# Patient Record
Sex: Male | Born: 1950 | Race: Black or African American | Hispanic: No | Marital: Married | Smoking: Former smoker
Health system: Southern US, Community
[De-identification: ages and names within clinical notes are randomized; demographics above are authoritative.]

## PROBLEM LIST (undated history)

## (undated) DIAGNOSIS — IMO0002 Reserved for concepts with insufficient information to code with codable children: Secondary | ICD-10-CM

## (undated) DIAGNOSIS — L02429 Furuncle of limb, unspecified: Secondary | ICD-10-CM

## (undated) DIAGNOSIS — G4730 Sleep apnea, unspecified: Secondary | ICD-10-CM

## (undated) DIAGNOSIS — M25519 Pain in unspecified shoulder: Secondary | ICD-10-CM

## (undated) DIAGNOSIS — C61 Malignant neoplasm of prostate: Secondary | ICD-10-CM

## (undated) DIAGNOSIS — T8859XA Other complications of anesthesia, initial encounter: Secondary | ICD-10-CM

## (undated) DIAGNOSIS — R7309 Other abnormal glucose: Secondary | ICD-10-CM

## (undated) DIAGNOSIS — J329 Chronic sinusitis, unspecified: Secondary | ICD-10-CM

## (undated) DIAGNOSIS — D649 Anemia, unspecified: Secondary | ICD-10-CM

## (undated) DIAGNOSIS — E785 Hyperlipidemia, unspecified: Secondary | ICD-10-CM

## (undated) DIAGNOSIS — J018 Other acute sinusitis: Secondary | ICD-10-CM

## (undated) DIAGNOSIS — R0600 Dyspnea, unspecified: Secondary | ICD-10-CM

## (undated) DIAGNOSIS — R0609 Other forms of dyspnea: Secondary | ICD-10-CM

## (undated) DIAGNOSIS — J849 Interstitial pulmonary disease, unspecified: Secondary | ICD-10-CM

## (undated) DIAGNOSIS — G4733 Obstructive sleep apnea (adult) (pediatric): Secondary | ICD-10-CM

## (undated) DIAGNOSIS — E039 Hypothyroidism, unspecified: Secondary | ICD-10-CM

## (undated) DIAGNOSIS — J45909 Unspecified asthma, uncomplicated: Secondary | ICD-10-CM

## (undated) DIAGNOSIS — M1990 Unspecified osteoarthritis, unspecified site: Secondary | ICD-10-CM

## (undated) DIAGNOSIS — Z8639 Personal history of other endocrine, nutritional and metabolic disease: Secondary | ICD-10-CM

## (undated) DIAGNOSIS — K219 Gastro-esophageal reflux disease without esophagitis: Secondary | ICD-10-CM

## (undated) DIAGNOSIS — G5600 Carpal tunnel syndrome, unspecified upper limb: Secondary | ICD-10-CM

## (undated) DIAGNOSIS — R599 Enlarged lymph nodes, unspecified: Secondary | ICD-10-CM

## (undated) DIAGNOSIS — Z8719 Personal history of other diseases of the digestive system: Secondary | ICD-10-CM

## (undated) DIAGNOSIS — I77810 Thoracic aortic ectasia: Secondary | ICD-10-CM

## (undated) DIAGNOSIS — R918 Other nonspecific abnormal finding of lung field: Secondary | ICD-10-CM

## (undated) DIAGNOSIS — B370 Candidal stomatitis: Secondary | ICD-10-CM

## (undated) DIAGNOSIS — J189 Pneumonia, unspecified organism: Secondary | ICD-10-CM

## (undated) DIAGNOSIS — R972 Elevated prostate specific antigen [PSA]: Secondary | ICD-10-CM

## (undated) DIAGNOSIS — Z86010 Personal history of colonic polyps: Secondary | ICD-10-CM

## (undated) DIAGNOSIS — I6529 Occlusion and stenosis of unspecified carotid artery: Secondary | ICD-10-CM

## (undated) DIAGNOSIS — D696 Thrombocytopenia, unspecified: Secondary | ICD-10-CM

## (undated) DIAGNOSIS — J811 Chronic pulmonary edema: Secondary | ICD-10-CM

## (undated) DIAGNOSIS — T4145XA Adverse effect of unspecified anesthetic, initial encounter: Secondary | ICD-10-CM

## (undated) HISTORY — DX: Carpal tunnel syndrome, unspecified upper limb: G56.00

## (undated) HISTORY — DX: Obstructive sleep apnea (adult) (pediatric): G47.33

## (undated) HISTORY — DX: Personal history of colonic polyps: Z86.010

## (undated) HISTORY — DX: Other acute sinusitis: J01.80

## (undated) HISTORY — PX: LUNG BIOPSY: SHX232

## (undated) HISTORY — DX: Gastro-esophageal reflux disease without esophagitis: K21.9

## (undated) HISTORY — DX: Other nonspecific abnormal finding of lung field: R91.8

## (undated) HISTORY — DX: Thrombocytopenia, unspecified: D69.6

## (undated) HISTORY — DX: Thoracic aortic ectasia: I77.810

## (undated) HISTORY — DX: Pain in unspecified shoulder: M25.519

## (undated) HISTORY — DX: Malignant neoplasm of prostate: C61

## (undated) HISTORY — PX: NASAL SINUS SURGERY: SHX719

## (undated) HISTORY — DX: Chronic pulmonary edema: J81.1

## (undated) HISTORY — DX: Furuncle of limb, unspecified: L02.429

## (undated) HISTORY — PX: POLYPECTOMY: SHX149

## (undated) HISTORY — DX: Anemia, unspecified: D64.9

## (undated) HISTORY — DX: Hyperlipidemia, unspecified: E78.5

## (undated) HISTORY — DX: Pneumonia, unspecified organism: J18.9

## (undated) HISTORY — DX: Other abnormal glucose: R73.09

## (undated) HISTORY — DX: Chronic sinusitis, unspecified: J32.9

## (undated) HISTORY — DX: Unspecified asthma, uncomplicated: J45.909

## (undated) HISTORY — DX: Unspecified osteoarthritis, unspecified site: M19.90

## (undated) HISTORY — DX: Occlusion and stenosis of unspecified carotid artery: I65.29

## (undated) HISTORY — DX: Enlarged lymph nodes, unspecified: R59.9

## (undated) HISTORY — DX: Candidal stomatitis: B37.0

## (undated) HISTORY — DX: Elevated prostate specific antigen (PSA): R97.20

---

## 2002-03-07 ENCOUNTER — Ambulatory Visit (HOSPITAL_BASED_OUTPATIENT_CLINIC_OR_DEPARTMENT_OTHER): Admission: RE | Admit: 2002-03-07 | Discharge: 2002-03-07 | Payer: Self-pay | Admitting: *Deleted

## 2002-03-07 ENCOUNTER — Encounter: Payer: Self-pay | Admitting: Pulmonary Disease

## 2004-01-09 ENCOUNTER — Encounter: Admission: RE | Admit: 2004-01-09 | Discharge: 2004-01-09 | Payer: Self-pay | Admitting: Sports Medicine

## 2004-03-08 ENCOUNTER — Ambulatory Visit: Payer: Self-pay | Admitting: Internal Medicine

## 2004-03-13 ENCOUNTER — Ambulatory Visit: Payer: Self-pay | Admitting: Internal Medicine

## 2004-04-05 ENCOUNTER — Ambulatory Visit: Payer: Self-pay | Admitting: Internal Medicine

## 2004-04-09 ENCOUNTER — Ambulatory Visit: Payer: Self-pay

## 2004-04-18 ENCOUNTER — Ambulatory Visit: Payer: Self-pay | Admitting: Gastroenterology

## 2004-04-30 ENCOUNTER — Ambulatory Visit: Payer: Self-pay | Admitting: Internal Medicine

## 2004-05-14 ENCOUNTER — Ambulatory Visit: Payer: Self-pay | Admitting: Internal Medicine

## 2004-05-29 ENCOUNTER — Ambulatory Visit: Payer: Self-pay

## 2004-06-10 ENCOUNTER — Ambulatory Visit: Payer: Self-pay | Admitting: Internal Medicine

## 2004-06-12 ENCOUNTER — Ambulatory Visit: Payer: Self-pay | Admitting: Gastroenterology

## 2004-06-14 ENCOUNTER — Ambulatory Visit: Payer: Self-pay

## 2004-06-14 ENCOUNTER — Ambulatory Visit: Payer: Self-pay | Admitting: Internal Medicine

## 2004-07-23 ENCOUNTER — Ambulatory Visit: Payer: Self-pay | Admitting: Gastroenterology

## 2004-09-09 ENCOUNTER — Ambulatory Visit: Payer: Self-pay | Admitting: Internal Medicine

## 2004-09-30 ENCOUNTER — Encounter: Admission: RE | Admit: 2004-09-30 | Discharge: 2004-09-30 | Payer: Self-pay | Admitting: Sports Medicine

## 2004-10-23 ENCOUNTER — Ambulatory Visit: Payer: Self-pay | Admitting: Internal Medicine

## 2004-11-01 ENCOUNTER — Ambulatory Visit: Payer: Self-pay | Admitting: Cardiology

## 2004-11-13 ENCOUNTER — Ambulatory Visit: Payer: Self-pay

## 2004-11-29 ENCOUNTER — Ambulatory Visit: Payer: Self-pay | Admitting: Internal Medicine

## 2004-12-06 ENCOUNTER — Ambulatory Visit: Payer: Self-pay | Admitting: Cardiology

## 2005-01-22 ENCOUNTER — Ambulatory Visit: Payer: Self-pay | Admitting: Internal Medicine

## 2005-07-06 ENCOUNTER — Emergency Department (HOSPITAL_COMMUNITY): Admission: EM | Admit: 2005-07-06 | Discharge: 2005-07-06 | Payer: Self-pay | Admitting: Emergency Medicine

## 2005-08-06 ENCOUNTER — Ambulatory Visit: Payer: Self-pay | Admitting: Endocrinology

## 2005-08-13 ENCOUNTER — Ambulatory Visit: Payer: Self-pay | Admitting: Internal Medicine

## 2005-08-14 ENCOUNTER — Ambulatory Visit: Payer: Self-pay | Admitting: Cardiology

## 2005-08-21 ENCOUNTER — Ambulatory Visit: Payer: Self-pay | Admitting: Critical Care Medicine

## 2005-08-22 ENCOUNTER — Ambulatory Visit (HOSPITAL_COMMUNITY): Admission: RE | Admit: 2005-08-22 | Discharge: 2005-08-22 | Payer: Self-pay | Admitting: Critical Care Medicine

## 2005-08-22 ENCOUNTER — Encounter (INDEPENDENT_AMBULATORY_CARE_PROVIDER_SITE_OTHER): Payer: Self-pay | Admitting: Specialist

## 2005-09-16 ENCOUNTER — Ambulatory Visit: Payer: Self-pay | Admitting: Critical Care Medicine

## 2006-01-06 ENCOUNTER — Ambulatory Visit: Payer: Self-pay | Admitting: Critical Care Medicine

## 2006-07-16 ENCOUNTER — Ambulatory Visit: Payer: Self-pay | Admitting: Critical Care Medicine

## 2006-12-10 ENCOUNTER — Ambulatory Visit: Payer: Self-pay | Admitting: Critical Care Medicine

## 2006-12-23 ENCOUNTER — Encounter: Payer: Self-pay | Admitting: *Deleted

## 2006-12-23 DIAGNOSIS — Z860100 Personal history of colon polyps, unspecified: Secondary | ICD-10-CM

## 2006-12-23 DIAGNOSIS — G4733 Obstructive sleep apnea (adult) (pediatric): Secondary | ICD-10-CM | POA: Insufficient documentation

## 2006-12-23 DIAGNOSIS — E039 Hypothyroidism, unspecified: Secondary | ICD-10-CM

## 2006-12-23 DIAGNOSIS — K219 Gastro-esophageal reflux disease without esophagitis: Secondary | ICD-10-CM

## 2006-12-23 HISTORY — DX: Gastro-esophageal reflux disease without esophagitis: K21.9

## 2006-12-23 HISTORY — DX: Personal history of colonic polyps: Z86.010

## 2006-12-23 HISTORY — DX: Obstructive sleep apnea (adult) (pediatric): G47.33

## 2006-12-23 HISTORY — DX: Personal history of colon polyps, unspecified: Z86.0100

## 2006-12-23 HISTORY — DX: Hypothyroidism, unspecified: E03.9

## 2007-02-11 DIAGNOSIS — J45909 Unspecified asthma, uncomplicated: Secondary | ICD-10-CM

## 2007-02-11 DIAGNOSIS — G4710 Hypersomnia, unspecified: Secondary | ICD-10-CM | POA: Insufficient documentation

## 2007-02-11 DIAGNOSIS — G4730 Sleep apnea, unspecified: Secondary | ICD-10-CM

## 2007-02-11 HISTORY — DX: Unspecified asthma, uncomplicated: J45.909

## 2007-06-22 ENCOUNTER — Emergency Department (HOSPITAL_COMMUNITY): Admission: EM | Admit: 2007-06-22 | Discharge: 2007-06-22 | Payer: Self-pay | Admitting: Emergency Medicine

## 2007-06-24 ENCOUNTER — Ambulatory Visit: Payer: Self-pay | Admitting: Critical Care Medicine

## 2007-07-05 ENCOUNTER — Ambulatory Visit: Payer: Self-pay | Admitting: Critical Care Medicine

## 2007-07-05 DIAGNOSIS — J811 Chronic pulmonary edema: Secondary | ICD-10-CM

## 2007-07-05 HISTORY — DX: Chronic pulmonary edema: J81.1

## 2007-07-05 LAB — CONVERTED CEMR LAB
BUN: 7 mg/dL (ref 6–23)
CO2: 29 meq/L (ref 19–32)
Calcium: 9 mg/dL (ref 8.4–10.5)
Chloride: 98 meq/L (ref 96–112)
Creatinine, Ser: 0.7 mg/dL (ref 0.4–1.5)
GFR calc Af Amer: 149 mL/min
GFR calc non Af Amer: 123 mL/min
Glucose, Bld: 87 mg/dL (ref 70–99)
Potassium: 3.9 meq/L (ref 3.5–5.1)
Pro B Natriuretic peptide (BNP): 41 pg/mL (ref 0.0–100.0)
Sodium: 133 meq/L — ABNORMAL LOW (ref 135–145)

## 2007-07-06 ENCOUNTER — Telehealth: Payer: Self-pay | Admitting: Critical Care Medicine

## 2007-07-07 ENCOUNTER — Ambulatory Visit: Payer: Self-pay | Admitting: Emergency Medicine

## 2007-07-07 ENCOUNTER — Ambulatory Visit: Payer: Self-pay | Admitting: Cardiovascular Disease

## 2007-07-07 ENCOUNTER — Telehealth: Payer: Self-pay | Admitting: Critical Care Medicine

## 2007-07-07 ENCOUNTER — Encounter: Payer: Self-pay | Admitting: Critical Care Medicine

## 2007-07-07 ENCOUNTER — Inpatient Hospital Stay (HOSPITAL_COMMUNITY): Admission: AD | Admit: 2007-07-07 | Discharge: 2007-07-16 | Payer: Self-pay | Admitting: Cardiovascular Disease

## 2007-07-08 ENCOUNTER — Encounter: Payer: Self-pay | Admitting: Critical Care Medicine

## 2007-07-08 ENCOUNTER — Encounter: Payer: Self-pay | Admitting: Gastroenterology

## 2007-07-11 ENCOUNTER — Ambulatory Visit: Payer: Self-pay | Admitting: Thoracic Surgery (Cardiothoracic Vascular Surgery)

## 2007-07-12 ENCOUNTER — Encounter: Payer: Self-pay | Admitting: Cardiothoracic Surgery

## 2007-07-12 ENCOUNTER — Encounter: Payer: Self-pay | Admitting: Critical Care Medicine

## 2007-07-12 HISTORY — PX: MEDIASTINOSCOPY: SHX5086

## 2007-07-13 ENCOUNTER — Encounter: Payer: Self-pay | Admitting: Critical Care Medicine

## 2007-07-16 ENCOUNTER — Encounter: Payer: Self-pay | Admitting: Internal Medicine

## 2007-07-28 ENCOUNTER — Ambulatory Visit: Payer: Self-pay

## 2007-07-28 ENCOUNTER — Ambulatory Visit: Payer: Self-pay | Admitting: Cardiovascular Disease

## 2007-07-28 ENCOUNTER — Encounter: Payer: Self-pay | Admitting: Cardiovascular Disease

## 2007-07-29 ENCOUNTER — Telehealth (INDEPENDENT_AMBULATORY_CARE_PROVIDER_SITE_OTHER): Payer: Self-pay | Admitting: *Deleted

## 2007-08-03 ENCOUNTER — Ambulatory Visit: Payer: Self-pay | Admitting: Gastroenterology

## 2007-08-09 ENCOUNTER — Telehealth (INDEPENDENT_AMBULATORY_CARE_PROVIDER_SITE_OTHER): Payer: Self-pay | Admitting: *Deleted

## 2007-08-30 ENCOUNTER — Ambulatory Visit: Payer: Self-pay | Admitting: Critical Care Medicine

## 2007-08-30 DIAGNOSIS — R599 Enlarged lymph nodes, unspecified: Secondary | ICD-10-CM

## 2007-08-30 HISTORY — DX: Enlarged lymph nodes, unspecified: R59.9

## 2008-01-10 ENCOUNTER — Ambulatory Visit: Payer: Self-pay

## 2008-01-10 ENCOUNTER — Ambulatory Visit: Payer: Self-pay | Admitting: Cardiovascular Disease

## 2008-01-10 HISTORY — PX: CARDIOVASCULAR STRESS TEST: SHX262

## 2008-01-20 ENCOUNTER — Ambulatory Visit: Payer: Self-pay | Admitting: Critical Care Medicine

## 2008-03-21 ENCOUNTER — Ambulatory Visit: Payer: Self-pay | Admitting: Internal Medicine

## 2008-03-28 ENCOUNTER — Ambulatory Visit: Payer: Self-pay | Admitting: Internal Medicine

## 2008-03-28 LAB — CONVERTED CEMR LAB
ALT: 20 units/L (ref 0–53)
AST: 27 units/L (ref 0–37)
Albumin: 3.3 g/dL — ABNORMAL LOW (ref 3.5–5.2)
Alkaline Phosphatase: 53 units/L (ref 39–117)
BUN: 14 mg/dL (ref 6–23)
Bilirubin, Direct: 0.1 mg/dL (ref 0.0–0.3)
CO2: 29 meq/L (ref 19–32)
Calcium: 9.2 mg/dL (ref 8.4–10.5)
Chloride: 105 meq/L (ref 96–112)
Cholesterol: 181 mg/dL (ref 0–200)
Creatinine, Ser: 0.8 mg/dL (ref 0.4–1.5)
GFR calc Af Amer: 128 mL/min
GFR calc non Af Amer: 106 mL/min
Glucose, Bld: 120 mg/dL — ABNORMAL HIGH (ref 70–99)
HDL: 35.1 mg/dL — ABNORMAL LOW (ref >39.0)
Hgb A1c MFr Bld: 6 % (ref 4.6–6.0)
LDL Cholesterol: 130 mg/dL — ABNORMAL HIGH (ref 0–99)
Potassium: 4.2 meq/L (ref 3.5–5.1)
Sodium: 139 meq/L (ref 135–145)
TSH: 2.81 microintl units/mL (ref 0.35–5.50)
Total Bilirubin: 0.5 mg/dL (ref 0.3–1.2)
Total CHOL/HDL Ratio: 5.2
Total Protein: 8 g/dL (ref 6.0–8.3)
Triglycerides: 78 mg/dL (ref 0–149)
VLDL: 16 mg/dL (ref 0–40)

## 2008-04-03 ENCOUNTER — Encounter: Payer: Self-pay | Admitting: Internal Medicine

## 2008-04-04 ENCOUNTER — Telehealth (INDEPENDENT_AMBULATORY_CARE_PROVIDER_SITE_OTHER): Payer: Self-pay | Admitting: *Deleted

## 2008-04-06 ENCOUNTER — Ambulatory Visit: Payer: Self-pay | Admitting: Critical Care Medicine

## 2008-04-06 ENCOUNTER — Encounter: Payer: Self-pay | Admitting: Internal Medicine

## 2008-04-06 ENCOUNTER — Ambulatory Visit: Payer: Self-pay | Admitting: Diagnostic Radiology

## 2008-04-06 ENCOUNTER — Ambulatory Visit (HOSPITAL_BASED_OUTPATIENT_CLINIC_OR_DEPARTMENT_OTHER): Admission: RE | Admit: 2008-04-06 | Discharge: 2008-04-06 | Payer: Self-pay | Admitting: Critical Care Medicine

## 2008-04-06 LAB — CONVERTED CEMR LAB
ANA Titer 1: NEGATIVE
Angiotensin 1 Converting Enzyme: 43 units/L (ref 9–67)
Anti Nuclear Antibody(ANA): POSITIVE — AB
IgE (Immunoglobulin E), Serum: 6.6 intl units/mL (ref 0.0–180.0)
Pro B Natriuretic peptide (BNP): 21 pg/mL (ref 0.0–100.0)
Rheumatoid fact SerPl-aCnc: <20 intl units/mL — ABNORMAL LOW (ref 0.0–20.0)
Sed Rate: 7 mm/hr (ref 0–16)

## 2008-04-17 ENCOUNTER — Ambulatory Visit: Payer: Self-pay | Admitting: Critical Care Medicine

## 2008-04-25 ENCOUNTER — Ambulatory Visit: Payer: Self-pay | Admitting: Pulmonary Disease

## 2008-05-24 ENCOUNTER — Encounter (INDEPENDENT_AMBULATORY_CARE_PROVIDER_SITE_OTHER): Payer: Self-pay | Admitting: *Deleted

## 2008-06-22 ENCOUNTER — Ambulatory Visit: Payer: Self-pay | Admitting: Critical Care Medicine

## 2008-06-24 ENCOUNTER — Encounter: Payer: Self-pay | Admitting: Pulmonary Disease

## 2008-11-14 ENCOUNTER — Ambulatory Visit: Payer: Self-pay | Admitting: Internal Medicine

## 2008-11-14 DIAGNOSIS — M25519 Pain in unspecified shoulder: Secondary | ICD-10-CM

## 2008-11-14 DIAGNOSIS — R7309 Other abnormal glucose: Secondary | ICD-10-CM

## 2008-11-14 HISTORY — DX: Pain in unspecified shoulder: M25.519

## 2008-11-14 HISTORY — DX: Other abnormal glucose: R73.09

## 2009-01-02 ENCOUNTER — Ambulatory Visit: Payer: Self-pay | Admitting: Gastroenterology

## 2009-01-15 ENCOUNTER — Ambulatory Visit: Payer: Self-pay | Admitting: Gastroenterology

## 2009-01-15 ENCOUNTER — Encounter: Payer: Self-pay | Admitting: Gastroenterology

## 2009-01-15 HISTORY — PX: COLONOSCOPY: SHX174

## 2009-01-15 LAB — HM COLONOSCOPY

## 2009-01-16 ENCOUNTER — Encounter: Payer: Self-pay | Admitting: Gastroenterology

## 2009-02-06 ENCOUNTER — Ambulatory Visit: Payer: Self-pay | Admitting: Internal Medicine

## 2009-02-06 LAB — CONVERTED CEMR LAB
ALT: 15 units/L (ref 0–53)
AST: 24 units/L (ref 0–37)
Albumin: 4.1 g/dL (ref 3.5–5.2)
Alkaline Phosphatase: 61 units/L (ref 39–117)
BUN: 15 mg/dL (ref 6–23)
Bilirubin, Direct: 0.1 mg/dL (ref 0.0–0.3)
CO2: 25 meq/L (ref 19–32)
Calcium: 9.4 mg/dL (ref 8.4–10.5)
Chloride: 105 meq/L (ref 96–112)
Cholesterol: 179 mg/dL (ref 0–200)
Creatinine, Ser: 0.8 mg/dL (ref 0.40–1.50)
Glucose, Bld: 96 mg/dL (ref 70–99)
HDL: 42 mg/dL (ref >39)
Hgb A1c MFr Bld: 5.8 % (ref 4.6–6.1)
Indirect Bilirubin: 0.3 mg/dL (ref 0.0–0.9)
LDL Cholesterol: 115 mg/dL — ABNORMAL HIGH (ref 0–99)
Potassium: 4.3 meq/L (ref 3.5–5.3)
Sodium: 139 meq/L (ref 135–145)
TSH: 2.453 microintl units/mL (ref 0.350–4.500)
Total Bilirubin: 0.4 mg/dL (ref 0.3–1.2)
Total CHOL/HDL Ratio: 4.3
Total Protein: 7.9 g/dL (ref 6.0–8.3)
Triglycerides: 112 mg/dL (ref <150)
VLDL: 22 mg/dL (ref 0–40)

## 2009-02-15 ENCOUNTER — Ambulatory Visit: Payer: Self-pay | Admitting: Internal Medicine

## 2009-02-15 DIAGNOSIS — G5600 Carpal tunnel syndrome, unspecified upper limb: Secondary | ICD-10-CM

## 2009-02-15 HISTORY — DX: Carpal tunnel syndrome, unspecified upper limb: G56.00

## 2009-04-05 ENCOUNTER — Emergency Department (HOSPITAL_COMMUNITY): Admission: EM | Admit: 2009-04-05 | Discharge: 2009-04-06 | Payer: Self-pay | Admitting: Emergency Medicine

## 2009-08-14 ENCOUNTER — Ambulatory Visit: Payer: Self-pay | Admitting: Internal Medicine

## 2009-08-14 DIAGNOSIS — L02429 Furuncle of limb, unspecified: Secondary | ICD-10-CM | POA: Insufficient documentation

## 2009-08-14 HISTORY — DX: Furuncle of limb, unspecified: L02.429

## 2009-08-16 ENCOUNTER — Telehealth: Payer: Self-pay | Admitting: Internal Medicine

## 2009-08-16 ENCOUNTER — Ambulatory Visit: Payer: Self-pay | Admitting: Internal Medicine

## 2009-08-17 ENCOUNTER — Encounter: Payer: Self-pay | Admitting: Internal Medicine

## 2009-08-20 ENCOUNTER — Telehealth: Payer: Self-pay | Admitting: Internal Medicine

## 2009-08-21 ENCOUNTER — Ambulatory Visit: Payer: Self-pay | Admitting: Internal Medicine

## 2009-08-22 ENCOUNTER — Encounter: Payer: Self-pay | Admitting: Internal Medicine

## 2010-01-29 ENCOUNTER — Ambulatory Visit: Payer: Self-pay | Admitting: Internal Medicine

## 2010-01-29 DIAGNOSIS — J018 Other acute sinusitis: Secondary | ICD-10-CM

## 2010-01-29 HISTORY — DX: Other acute sinusitis: J01.80

## 2010-04-30 NOTE — Miscellaneous (Signed)
Summary: previsit  Clinical Lists Changes  Medications: Added new medication of MOVIPREP 100 GM  SOLR (PEG-KCL-NACL-NASULF-NA ASC-C) As directed - Signed Rx of MOVIPREP 100 GM  SOLR (PEG-KCL-NACL-NASULF-NA ASC-C) As directed;  #1 x 0;  Signed;  Entered by: Clide Cliff RN;  Authorized by: Rachael Fee MD;  Method used: Electronically to [id number]*, [address], [place], [place]  [address], Ph: [phone], Fax: [phone] Allergies: Added new allergy or adverse reaction of * IVP DYE Observations: Added new observation of NKA: F (01/02/2009 13:36)    Prescriptions: MOVIPREP 100 GM  SOLR (PEG-KCL-NACL-NASULF-NA ASC-C) As directed  #1 x 0   Entered by:   Clide Cliff RN   Authorized by:   Rachael Fee MD   Signed by:   Clide Cliff RN on 01/02/2009   Method used:   Electronically to        [facility] #[id number]* (retail)       [address] [place], [place]  [address]       Ph: [phone]       Fax: [phone]   RxID:   [id number]

## 2010-04-30 NOTE — Progress Notes (Signed)
Summary: Status Update  Phone Note Call from Patient Call back at Home Phone [phone]   Caller: Patient Call For: [name] Summary of Call: patient call to state Dr Artist Pais wanted him to check back with him if his insect bite did no improve. He state the site is now fill with pus, it is red and sore, and hot. He states the site is not draining just yet. He is still taking the antibiotics, and denies fever Initial call taken by: Glendell Docker CMA,  Aug 16, 2009 9:05 AM  Follow-up for Phone Call        needs OV tomorrow Follow-up by: [name],  Aug 16, 2009 9:06 AM  Additional Follow-up for Phone Call Additional follow up Details #1::        patient advised per Dr Artist Pais instructions, and he request to be seen today if possible. He states the site looks like it will be coming to a head. 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 Additional Follow-up by: Glendell Docker CMA,  Aug 16, 2009 10:39 AM

## 2010-04-30 NOTE — Assessment & Plan Note (Signed)
Summary: per phone call from dr Alante Weimann/mhf   Vital Signs:  Patient profile:   60 year old male Weight:      288 pounds BMI:     37.11 O2 Sat:      96 % on Room air Temp:     98.2 degrees F oral Pulse rate:   91 / minute Pulse rhythm:   regular Resp:     18 per minute BP sitting:   122 / 70  (right arm) Cuff size:   large  Vitals Entered By: Glendell Docker CMA (Aug 21, 2009 2:13 PM)  O2 Flow:  Room air CC: Rm 2-  wound evaluation   Primary Care Provider:  Dondra Spry DO  CC:  Rm 2-  wound evaluation.  History of Present Illness: 59 y/o AA male for f/u re:   leg boil symptoms better but not resolved  areas is still draining small amt of pus no fever or chills.  overall swelling has gone down  Allergies: 1)  ! * Ivp Dye  Past History:  Past Medical History: Colonic polyps, hx of GERD Asthma  Mediastinal LAN       - neg mediastinoscopy biopsy 2009 Pleural effusion with neg w/u  2009  pericardial effusion that self resolved  2009 Left shoulder tendinitis    Family History: Family History MI/Heart Attack father Family History Prostate Cancer  father DM II - daughter Colon ca - no         Social History: Patient states former smoker.  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   Lungs:  normal respiratory effort and normal breath sounds.   Heart:  normal rate, regular rhythm, and no gallop.   Skin:  3-4 cm inderated area back of right lower thigh  mild drainage   Impression & Recommendations:  Problem # 1:  CARBUNCLE AND FURUNCLE OF LEG EXCEPT FOOT (ICD-680.6) Assessment Improved Abscess needs further I & D.  I suggest surgical referral.  complete full course of abx. wound culture showed    Order Note: CHART: [id number]; COLLECTED BY NURSING ! GRAM STAIN:               Few ! GRAM STAIN:       RESULT: WBC present-predominately  PMN ! GRAM STAIN:       RESULT: No Squamous Epithelial Cells Seen ! GRAM STAIN:       RESULT: Moderate GRAM POSITIVE COCCI IN PAIRS In Clusters ! FINAL REPORT              NO GROWTH 3 DAYS  Cover for MRSA Orders: Surgical Referral (Surgery)  Complete Medication List: 1)  Clindamycin Hcl 150 Mg Caps (Clindamycin hcl) .... One by mouth three times a day 2)  Sulfamethoxazole-tmp Ds 800-160 Mg Tabs (Sulfamethoxazole-trimethoprim) .... One by mouth bid 3)  Tramadol Hcl 50 Mg Tabs (Tramadol hcl) .... One by mouth two times a day prn  Current Allergies (reviewed today): ! * IVP DYE

## 2010-04-30 NOTE — Assessment & Plan Note (Signed)
Summary: ?INSECT BITE/HEA   Vital Signs:  Patient profile:   60 year old male Height:      74 inches Weight:      293 pounds BMI:     37.75 O2 Sat:      97 % on Room air Temp:     98.0 degrees F oral Pulse rate:   94 / minute Pulse rhythm:   regular Resp:     18 per minute BP sitting:   110 / 76  (right arm) Cuff size:   large  Vitals Entered By: Glendell Docker CMA (Aug 14, 2009 2:25 PM)  O2 Flow:  Room air CC: Rm 3-  Right  side knee   Primary Care Provider:  DThomos Lemons DO  CC:  Rm 3-  Right  side knee.  History of Present Illness: 60 y/o AA male c/o insect bite he noticed yesterday , felt knot on right side of knee, hot to touch , and getting larger he was at a cook out this weekend spent time near wooded area no fever or chills  Allergies: 1)  ! * Ivp Dye  Past History:  Past Medical History: Colonic polyps, hx of GERD Asthma Mediastinal LAN       - neg mediastinoscopy biopsy 2009 Pleural effusion with neg w/u  2009 pericardial effusion that self resolved  2009 Left shoulder tendinitis    Family History: Family History MI/Heart Attack father Family History Prostate Cancer  father DM II - daughter Colon ca - no       Social History: Patient states former smoker.  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   Lungs:  normal respiratory effort and normal breath sounds.   Heart:  normal rate, regular rhythm, and no gallop.   Skin:  3-4 cm inderated area back of right lower thigh  mild redness, no drainage central single puncture mark   Impression & Recommendations:  Problem # 1:  INSECT BITE (ICD-919.4) Pt was at cook out 3-4 days ago.  He noted "knot" on back of right leg.  some swelling and pain.  exam c/w insect bite.   empiric doxy two times a day 10 days.  Patient advised to call office if symptoms persist or  worsen.  Complete Medication List: 1)  Doxycycline Hyclate 100 Mg Tabs (Doxycycline hyclate) .... One by mouth two times a day  Patient Instructions: 1)  Call our office if your symptoms do not  improve or gets worse. Prescriptions: DOXYCYCLINE HYCLATE 100 MG TABS (DOXYCYCLINE HYCLATE) one by mouth two times a day  #20 x 0   Entered and Authorized by:   [name]   Signed by:   [name] on 08/14/2009   Method used:   Electronically to        [facility]  [address] [address] [phone]* (retail)       [address]       [place], [place]  [address]       Ph: [phone] or [phone]       Fax: [phone]   RxID:   [id number]   Current Allergies (reviewed today): ! * IVP DYE

## 2010-04-30 NOTE — Assessment & Plan Note (Signed)
Summary: 6 month follow up /mhf   Vital Signs:  Patient profile:   60 year old male Height:      74 inches Weight:      292.75 pounds BMI:     37.72 O2 Sat:      97 % on Room air Temp:     98.3 degrees F oral Pulse rate:   78 / minute Pulse rhythm:   regular Resp:     18 per minute BP sitting:   122 / 90  (right arm) Cuff size:   large  Vitals Entered By: Glendell Docker CMA (January 29, 2010 11:01 AM)  O2 Flow:  Room air CC: 6 month follow up  Is Patient Diabetic? No Pain Assessment Patient in pain? no        Primary Care Provider:  Dondra Spry DO  CC:  6 month follow up .  History of Present Illness: 60 y/o AA male c/o chronic sinus congestion whitish/ yellowish nasal discharge no fever or chills  Allergies: 1)  ! * Ivp Dye  Past History:  Past Medical History: Colonic polyps, hx of  GERD Asthma  Mediastinal LAN       - neg mediastinoscopy biopsy 2009 Pleural effusion with neg w/u  2009  pericardial effusion that self resolved  2009 Left shoulder tendinitis    Past Surgical History: Mediastinoscopy 2009     Family History: Family History MI/Heart Attack father Family History Prostate Cancer  father DM II - daughter Colon ca - no          Physical Exam  General:  alert, well-developed, and well-nourished.   Lungs:  normal respiratory effort and normal breath sounds.   Heart:  normal rate, regular rhythm, and no gallop.   Extremities:  No lower extremity edema    Impression & Recommendations:  Problem # 1:  RHINOSINUSITIS, ACUTE (ICD-461.8)  The following medications were removed from the medication list:    Clindamycin Hcl 150 Mg Caps (Clindamycin hcl) ..... One by mouth three times a day    Sulfamethoxazole-tmp Ds 800-160 Mg Tabs (Sulfamethoxazole-trimethoprim) ..... One by mouth bid His updated medication list for this problem includes:    Cefuroxime Axetil 500 Mg Tabs (Cefuroxime axetil) .Marland Kitchen... 1 by mouth two times a day  Fluticasone Propionate 50 Mcg/act Susp (Fluticasone propionate) .Marland Kitchen... 2 sprays each nostril once daily  Instructed on treatment. Call if symptoms persist or worsen.   Complete Medication List: 1)  Cefuroxime Axetil 500 Mg Tabs (Cefuroxime axetil) .Marland Kitchen.. 1 by mouth two times a day 2)  Fluticasone Propionate 50 Mcg/act Susp (Fluticasone propionate) .... 2 sprays each nostril once daily  Other Orders: Flu Vaccine 23yrs + (29562) Admin 1st Vaccine (13086)  Patient Instructions: 1)  Use neil med rinse over the counter 2)  Please schedule a follow-up appointment in 6 months. 3)  Call our office if your symptoms do not  improve or gets worse. 4)  BMP prior to visit, ICD-9:   790.29 5)  Lipid Panel prior to visit, ICD-9:  790.29 6)  PSA prior to visit, ICD-9: v76.44 7)  Please return for lab work one (1) week before your next appointment.  Prescriptions: FLUTICASONE PROPIONATE 50 MCG/ACT SUSP (FLUTICASONE PROPIONATE) 2 sprays each nostril once daily  #1 x 5   Entered and Authorized by:   [name]   Signed by:   [name] on 01/29/2010   Method used:   [facility]  [address] [phone][facility] (retail)       [address]       [place], [place]  [address]       Ph: [phone] or [phone]       Fax: [phone]   RxID:   [id number] CEFUROXIME AXETIL 500 MG TABS (CEFUROXIME AXETIL) 1 by mouth two times a day  #14 x 0   Entered and Authorized by:   [name]   Signed by:   [name] on 01/29/2010   Method used:   Print then Give to Patient   RxID:   [id number]    Orders Added: 1)  Flu Vaccine 55yrs + [87564] 2)  Admin 1st Vaccine [90471] 3)  Est. Patient Level III [33295]   Immunizations Administered:  Influenza Vaccine # 1:    Vaccine Type: Fluvax 3+    Site: right deltoid    Mfr: GlaxoSmithKline    Dose: 0.5 ml    Route: IM    Given by: Glendell Docker CMA    Exp. Date: 09/28/2010    Lot #: [id number]     VIS given: 10/23/09 version given January 29, 2010.  Flu Vaccine Consent Questions:    Do you have a history of severe allergic reactions to this vaccine? no    Any prior history of allergic reactions to egg and/or gelatin? no    Do you have a sensitivity to the preservative Thimersol? no    Do you have a past history of Guillan-Barre Syndrome? no    Do you currently have an acute febrile illness? no    Have you ever had a severe reaction to latex? no    Vaccine information given and explained to patient? yes   Immunizations Administered:  Influenza Vaccine # 1:    Vaccine Type: Fluvax 3+    Site: right deltoid    Mfr: GlaxoSmithKline    Dose: 0.5 ml    Route: IM    Given by: Glendell Docker CMA    Exp. Date: 09/28/2010    Lot #: [id number]    VIS given: 10/23/09 version given January 29, 2010.  Current Allergies (reviewed today): ! * IVP DYE

## 2010-04-30 NOTE — Assessment & Plan Note (Signed)
Summary: EVALUATION OF SKIN/DK   Vital Signs:  Patient profile:   60 year old male Weight:      125 pounds BMI:     16.11 O2 Sat:      98 % on Room air Temp:     98.5 degrees F oral Pulse rate:   93 / minute Pulse rhythm:   regular Resp:     16 per minute BP sitting:   120 / 80  (left arm) Cuff size:   large  Vitals Entered By: Glendell Docker CMA (Aug 16, 2009 4:10 PM)  O2 Flow:  Room air CC: Rm 2- evaluation of lowerr right thigh Comments evaluation of lower right thigh, increase in swelling and pain, swelling  and tenderness in right groin,    Primary Care Provider:  Dondra Spry DO  CC:  Rm 2- evaluation of lowerr right thigh.  History of Present Illness: 60 y/o AA male for fu re:  right leg "insect bite" he started doxy as directed.  area started draining significant amout of pus felt feverish,  right lower thigh feels tight  Allergies: 1)  ! * Ivp Dye  Past History:  Past Medical History: Colonic polyps, hx of GERD Asthma Mediastinal LAN       - neg mediastinoscopy biopsy 2009 Pleural effusion with neg w/u  2009  pericardial effusion that self resolved  2009 Left shoulder tendinitis    Past Surgical History: Mediastinoscopy 2009    Family History: Family History MI/Heart Attack father Family History Prostate Cancer  father DM II - daughter Colon ca - no        Social History: Patient states former smoker.  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   Lungs:  normal respiratory effort and normal breath sounds.   Heart:  normal rate, regular rhythm, and no gallop.   Skin:  3-4 cm inderated area back of right lower thigh  purulent drainage,  area is tendner   Impression & Recommendations:  Problem # 1:  CARBUNCLE AND FURUNCLE OF LEG EXCEPT FOOT (ICD-680.6) I & D performed on right thigh abscess using aspetic technique.   wound culture sent.  aftercare discussed switch abx to clinda and bactrim. red flag signs reviewed.  if symptoms get worse over weekend, he understands to go to ER for IV abx and further surgical drainage if needed.  Complete Medication List: 1)  Clindamycin Hcl 150 Mg Caps (Clindamycin hcl) .... One by mouth three times a day 2)  Sulfamethoxazole-tmp Ds 800-160 Mg Tabs (Sulfamethoxazole-trimethoprim) .... One by mouth bid 3)  Tramadol Hcl 50 Mg Tabs (Tramadol hcl) .... One by mouth two times a day prn  Patient Instructions: 1)  Take antibiotic as directed 2)  Change dressing twice daily 3)  Use salt water soaked dressing two times a day  4)  If leg pain gets worse and / or you develop fever, go the ER for evaulation 5)  Please schedule a follow-up appointment in 1 week Prescriptions: TRAMADOL HCL 50 MG TABS (TRAMADOL HCL) one by mouth two times a day prn  #30 x 0   Entered and Authorized by:   [name]   Signed by:   [name] on 08/16/2009   Method used:   [facility]  [address] [phone][facility] (  [facility]ail)       [address]       [place], [place]  [address]       Ph: [phone] or [phone]       Fax: [phone]   RxID:   [id number] SULFAMETHOXAZOLE-TMP DS 800-160 MG TABS (SULFAMETHOXAZOLE-TRIMETHOPRIM) one by mouth bid  #20 x 0   Entered and Authorized by:   [name]   Signed by:   [name] on 08/16/2009   Method used:   [facility]  [address] [phone][facility] ([facility]ail)       [address]       [place], [place]  [address]       Ph: [phone] or [phone]       Fax: [phone]   RxID:   [id number] CLINDAMYCIN HCL 150 MG CAPS (CLINDAMYCIN HCL) one by mouth three times a day  #30 x 0   Entered and Authorized by:   [name]   Signed by:   [name] on 08/16/2009   Method used:   [facility]  [address] [phone][facility] ([facility]ail)       [address]       [place], [place]  [address]       Ph: [phone] or [phone]       Fax: [phone]   RxID:   [id number]   Current Allergies (reviewed today): ! * IVP DYE  Appended Document: Lab Orders    Clinical Lists Changes  Orders: Added new Test order of T-Culture, Wound ([id number]) - Signed Added new Service order of Specimen Handling ([id number]) - Signed

## 2010-04-30 NOTE — Progress Notes (Signed)
Summary: Lab Results/Status Update  Phone Note Call from Patient Call back at Work Phone [phone]   Caller: Patient Call For: [name] Summary of Call: Patient called requesting lab results from wound. He states the site does not appear to be healing, and he was not sure if he needed to schedule a follow up  Initial call taken by: Glendell Docker CMA,  Aug 20, 2009 11:48 AM  Follow-up for Phone Call        call returned to patient he was advised that surgical drainage may be necessary per Dr Artist Pais, patient states the site is still draining, but since the phone call earlier, it has gone down. He was advised that he would recieve a phone call back regarding test results and surgery consult. Follow-up by: Glendell Docker CMA,  Aug 20, 2009 5:47 PM  Additional Follow-up for Phone Call Additional follow up Details #1::        I suggest repeat office visit ,  I will determine whether additional surgery necessary Additional Follow-up by: [name],  Aug 20, 2009 5:59 PM    Additional Follow-up for Phone Call Additional follow up Details #2::    attempted to contact patient [phone], no answer,detailed voice message left for patient per Dr Artist Pais instructions. Patient advised to call to schedule office visit. Follow-up by: Glendell Docker CMA,  Aug 21, 2009 8:22 AM

## 2010-05-03 NOTE — Consult Note (Signed)
Summary: [facility]   Imported By: Lanelle Bal 09/04/2009 13:26:53  _____________________________________________________________________  External Attachment:    Type:   Image     Comment:   External Document

## 2010-06-16 LAB — COMPREHENSIVE METABOLIC PANEL
ALT: 28 U/L (ref 0–53)
CO2: 21 mEq/L (ref 19–32)
Calcium: 8.1 mg/dL — ABNORMAL LOW (ref 8.4–10.5)
Creatinine, Ser: 0.67 mg/dL (ref 0.4–1.5)
GFR calc non Af Amer: >60 mL/min (ref >60)
Glucose, Bld: 104 mg/dL — ABNORMAL HIGH (ref 70–99)
Total Bilirubin: 0.5 mg/dL (ref 0.3–1.2)

## 2010-06-16 LAB — URINALYSIS, ROUTINE W REFLEX MICROSCOPIC
Bilirubin Urine: NEGATIVE
Glucose, UA: NEGATIVE mg/dL
Hgb urine dipstick: NEGATIVE
Ketones, ur: NEGATIVE mg/dL
Nitrite: NEGATIVE
Protein, ur: NEGATIVE mg/dL
Specific Gravity, Urine: 1.023 (ref 1.005–1.030)
Urobilinogen, UA: 1 mg/dL (ref 0.0–1.0)
pH: 5.5 (ref 5.0–8.0)

## 2010-06-16 LAB — URINE MICROSCOPIC-ADD ON

## 2010-06-16 LAB — DIFFERENTIAL
Basophils Absolute: 0 10*3/uL (ref 0.0–0.1)
Eosinophils Absolute: 0 10*3/uL (ref 0.0–0.7)
Lymphocytes Relative: 5 % — ABNORMAL LOW (ref 12–46)
Lymphs Abs: 0.5 10*3/uL — ABNORMAL LOW (ref 0.7–4.0)
Neutrophils Relative %: 90 % — ABNORMAL HIGH (ref 43–77)

## 2010-06-16 LAB — CBC
Hemoglobin: 12 g/dL — ABNORMAL LOW (ref 13.0–17.0)
MCHC: 33.9 g/dL (ref 30.0–36.0)
MCV: 87.7 fL (ref 78.0–100.0)
RBC: 4.05 MIL/uL — ABNORMAL LOW (ref 4.22–5.81)

## 2010-06-16 LAB — LIPASE, BLOOD: Lipase: 21 U/L (ref 11–59)

## 2010-08-13 NOTE — Assessment & Plan Note (Signed)
[facility]                            CARDIOLOGY OFFICE NOTE   NAME:Chad [name], Chad [name]                       MRN:          [id number]  DATE:07/28/2007                            DOB:          1951-02-08    Chad [name] returns today for followup.  He was initially seen after  having a CAT scan in our office showing pleural and pericardial  effusions.  He ended up having some sort of connective tissue problem  with a sed rate over 100.  He had a bronchial biopsy as well as a  mediastinoscopy.  His mediastinoscopy was difficult procedure performed  by Dr. Donata Clay.  It was somewhat complicated due to the patient's  mediastinal fat.  He was on a ventilator for 2 or days.   Unfortunately there was not a firm diagnosis made.  We initially thought  the patient had lymphoma or sarcoid.  He did have some giant cells on  his biopsy, but we do not have a uniform diagnosis.  The patient was  discharged on prednisone, now, on 20 mg a day.  He feels much better.  His cough is gone.  He has less dyspnea.   He had a followup 2-D echocardiogram.  His initial echo showed moderate  pericardial effusion with no tamponade.  I reviewed his echocardiogram  today x10 minutes.  His pericardial effusion is entirely gone, and heart  is otherwise normal with good LV function.   His IVC was totally flat.  In talking to the patient, he does feel  better, but still has lots of questions regarding his diagnosis.   I told him to be thankful that he does not appear to have cancer.   REVIEW OF SYSTEMS:  Otherwise negative.   MEDICATIONS:  1. Prednisone 20 a day.  2. Toprol 25 a day.  3. Nasonex 50 a day.  4. ProAir.  5. Qvar.   The patient has sleep apnea, but is not been wearing his mask.   PHYSICAL EXAMINATION:  Remarkable for an overweight black male in no  distress.  His weight is 279; blood pressure is 116/81; pulse is 79;  respiratory rate 16; afebrile.  HEENT:   Unremarkable.  Carotids are without bruit, no lymphadenopathy, no thyromegaly, no JVP  elevation.  LUNGS:  Currently clear with no rub.  No wheezing.  Good diaphragmatic  motion.  S1-S2 with no rub.  PMI normal.  ABDOMEN:  Benign.  Bowel sounds positive.  No AAA,  no tenderness, no  hepatosplenomegaly or hepatojugular reflux.  EXTREMITIES:  Distal pulses intact.  No edema.  NEUROLOGIC:  Nonfocal.  No muscular weakness.  SKIN:  Warm and dry.   IMPRESSION:  1. Dyspnea related to inflammation in the lungs and pleural effusions      seems to be improved.  Follow up with Dr. Delford Field.  Continue      prednisone 20 mg day.  Discuss with Dr. Delford Field the question of long-      term steroid dose versus other anti-inflammatories such as HUMARA,      consider a second opinion at  [facility] or [facility] since we do not have a      unifying diagnosis with pathology.  2. Pericardial effusion, now resolved, likely related to inflammation.      I suspect this will not return, can follow ESR in regards to anti-      inflammatory doses.  3. Hypertension currently well controlled.  Continue Toprol 25 mg a      day.  4. Sleep apnea.  Encouraged to wear a mask, again.  Follow up with Dr.      Delford Field.  5. History of colon polyps.  Follow up colonoscopy in 2 years.  I      believe, the patient is otherwise doing well.  He will have a      follow up with Dr. Delford Field, next week, to discuss his case further      with regards to his pulmonary issues.   I will see him back in 6 months.     Noralyn Pick. Eden Emms, MD, [facility]  Electronically Signed    PCN/MedQ  DD: 07/28/2007  DT: 07/28/2007  Job #: [id number]   cc:   Charlcie Cradle. Delford Field, MD, FCCP

## 2010-08-13 NOTE — Procedures (Signed)
[facility]                              EXERCISE TREADMILL   NAME:HINSONSigurd, Pugh                       MRN:          [id number]  DATE:01/10/2008                            DOB:          01-16-1951    EXERCISE STRESS TEST   The patient exercised for 6 minutes on a standard Bruce protocol.  Exercise was stopped due to fatigue and dyspnea.  Maximum heart rate was  151.  Peak blood pressure was 170/68.  Resting EKG was normal with  stress.  There was no ischemia.   IMPRESSION:  Normal exercise stress test at a good workload.     Noralyn Pick. Eden Emms, MD, [facility]  Electronically Signed    PCN/MedQ  DD: 01/10/2008  DT: 01/11/2008  Job #: [id number]

## 2010-08-13 NOTE — H&P (Signed)
NAMEBURLON, CENTRELLA NO.:  [id number]   MEDICAL RECORD NO.:  [id number]          PATIENT TYPE:  INP   LOCATION:  4731                         FACILITY:  [facility]   PHYSICIAN:  Leslye Peer, MD    DATE OF BIRTH:  1951-03-15   DATE OF ADMISSION:  07/07/2007  DATE OF DISCHARGE:                              HISTORY & PHYSICAL   CHIEF COMPLAINT:  Is shortness of breath, and abnormal chest CAT scan.   HISTORY OF PRESENT ILLNESS:  This is a very pleasant 60 year old African  American male patient who was recently seen by Dr. Delford Field in the  outpatient setting for fever and dyspnea.  Recently, he had had report  of a progressive cough, increased chest congestion, and had been seen in  [facility] and received an Avelox 10-day prescription as well as on an  injection of IM Rocephin.  He was he was sent home following that  evaluation.  He returned on July 05, 2007 to the pulmonary office with  no significant change in symptoms.  He was status post 10 days of  antibiotic therapy, also treated with ibuprofen for intermittent fevers,  as well as being resumed on Qvar.  He reported no improvement in his  symptomatology, in fact progressive shortness of breath. He reported his  dyspnea to onset with quite minimal exertion which is significantly  different than baseline, and progressive over the last 2 weeks.  He  reports an occasional dry cough which he reports is significantly worse  in the recumbent position.  He also reports occasional postnasal drip.  He reports his dyspnea is also worsened when lying in the flat position  in bed.  He reports intermittent fever and notes no significant timing  of onset.  He reports significant increase in fatigue to the point where  really has just been lying around recently.  He has had occasional chest  pain significantly with cough, and at times with exertion.  He continues  to have particularly notable nighttime wheezing.  After his   prior  evaluation on the 6th, Dr. Delford Field had all also felt that Mr. Huish had  some degree of pulmonary edema and at that time to further evaluate  dyspnea he had ordered a CT of the chest, which was completed today on  July 07, 2007.  The CT was completed that demonstrated mediastinal  lymphadenopathy which had progressed compared to a CT obtained May 2007.  He also had tiny bilateral pleural effusions and a small to moderate  pericardial effusion.  He had small focus of collapse/consolidation in  the posterior left lower lobe. He was seen at [facility]  following the CAT scan for evaluation of a pleural effusion by  echocardiogram.  He reportedly underwent a bedside echocardiogram for  which we do not have the results at this time for. He will be admitted  to the pulmonary critical care service for further evaluation all  pleural effusion, as well as lymphadenopathy and to rule out perhaps  potential cancerous etiology.   PAST MEDICAL HISTORY:  1. Colonic polyps.  2. Gastroesophageal  reflux disease.  3. Hypothyroidism.  4. Asthma.  5. Prior traumatic right pleural effusion with recurrence.   SOCIAL HISTORY:  He is retired from the post office, currently works as  a Engineer, civil (consulting).  He stopped smoking in 2000, apparently was  primarily a cigar smoker.   FAMILY HISTORY:  Is positive with his father having had an MI in the  past, as well as prostate cancer.   ALLERGIES:  NO KNOWN DRUG ALLERGIES.   CURRENT MEDICATIONS:  1. Qvar 40 mcg inhaled inhaler two puffs b.i.d.  2. Nasonex 50 mcg 2 puffs via nostril daily.  3. Furosemide 20 mg p.o. b.i.d. p.r.n. medication for lower extremity      edema.  4. With Klor-Con 10 milliequivalents p.o. b.i.d. both as a p.r.n.      medication for lower extremity edema.  5. Tussionex 5 mL q 12 p.r.n.Marland Kitchen   REVIEW OF SYSTEMS:  Per HPI.  Currently denies nausea, has had poor p.o.  intake over the last week and half. He reports  approximately a 13 pounds  weight loss over the last 2 weeks.  He has had no significant sick  exposures, no significant heartburn, no nausea or vomiting.  All other  pertinent positives have been noted in HPI.   PHYSICAL EXAM:  Currently vital signs are pending.  GENERAL:  This is a 60 year old African American male currently without  acute distress sitting up at bedside.  HEENT: Mucous membranes are  moist.  There is no JVD or adenopathy.  PULMONARY:  Notable for faint bibasilar crackles left greater than  right.  CARDIAC:  Regular rate and rhythm without murmur, rub or bruit.  EXTREMITIES:  Notable for trace lower extremity edema and chronic venous  stasis changes.  ABDOMEN:  Is nontender without organomegaly.  NEUROLOGICALLY:  Is grossly intact.   LABORATORY DATA:  Is pending.   IMPRESSION AND PLAN:  1. Mediastinal adenopathy with bilateral left greater than right      effusions, and pericardial effusions.  Need to rule out potential      lymphoma for a as etiology.  Plan is to admit the patient, will      have a interventional radiology evaluate the left effusion via      ultrasound, and do diagnostic thoracentesis if big enough, further      evaluation will likely include fiberoptic bronchoscopy versus      mediastinoscopy based on Dr. Lynelle Doctor followup evaluation.  2. Shortness of breath with fever.  Suspect this is a mostly related      to his pericardial effusion.  Reportedly, there is no evidence of      tamponade at this  juncture. However, he has had significant      antibiotic therapy at this point, and currently is without evidence      of active infection.  Therefore, will hold off on antibiotics,      obtain blood cultures x2, and give scheduled nonsteroidal anti-      inflammatories.  3. Asthma.  Plan for this is to continue his Qvar and p.r.n. Pro Air,      and finally best practice.  Will place the patient on proton pump      inhibitors, PAS hose in place of  low-molecular-weight heparin at      this point to encase further surgical diagnostics or bronchoscopy      is warranted.  We will leave him n.p.o. after midnight on day of  admission for possible surgical procedure, additionally we will get      multiple screening labs such as CBC with differential, ESR, BNP,      blood cultures x2, sputum and culture, cardiac enzymes, urine strep      and Legionella antigen.      Zenia Resides, NP      Leslye Peer, MD  Electronically Signed    PB/MEDQ  D:  07/07/2007  T:  07/07/2007  Job:  [id number]   cc:   Charlcie Cradle. Delford Field, MD, FCCP  Barbette Hair. Artist Pais, DO

## 2010-08-13 NOTE — Assessment & Plan Note (Signed)
[facility]                             PULMONARY OFFICE NOTE   NAME:Chad [name], Chad [name]                       MRN:          [id number]  DATE:12/10/2006                            DOB:          June 08, 1950    HISTORY OF PRESENT ILLNESS:  The patient is a 60 year old African  American male patient of Dr. Lynelle Doctor, who has a history of asthmatic  bronchitis and has a previous history of traumatic right pleural  effusion, requiring a thoracentesis in May 2007 with no evidence of  recurrence.  The patient presents today for an acute office visit.  The  patient complains of a 2-week history of productive cough with thick  white-yellow sputum and intermittent wheezing.  The patient denies any  fever, hemoptysis, orthopnea, PND or leg swelling.   PAST MEDICAL HISTORY:  Reviewed.   CURRENT MEDICATIONS:  Reviewed.   PHYSICAL EXAMINATION:  The patient is a pleasant male in no acute  distress.  He is afebrile with stable vital signs.  O2 saturation is 98% on room  air.  HEENT:  Unremarkable.  NECK:  Supple without cervical adenopathy.  No JVD.  LUNGS:  Sounds reveal a faint expiratory wheeze bilaterally.  CARDIAC:  Regular rate.  ABDOMEN:  Soft and nontender.  EXTREMITIES:  Warm without any edema.   IMPRESSION AND PLAN:  Acute asthmatic bronchitic flare.  The patient is  to begin a Z-Pak.  Add in Mucinex DM twice daily and a Xopenex nebulizer  treatment was given in the office today.  The patient will return back  with Dr. Delford Field as scheduled or sooner if needed.      Rubye Oaks, NP  Electronically Signed      Charlcie Cradle Delford Field, MD, [facility]  Electronically Signed   TP/MedQ  DD: 12/10/2006  DT: 12/11/2006  Job #: [id number]

## 2010-08-13 NOTE — Consult Note (Signed)
Chad [name], Chad [name] NO.:  [id number]   MEDICAL RECORD NO.:  [id number]          PATIENT TYPE:  EMS   LOCATION:  ED                           [facility]ACILITY[facility]  [facility]   PHYSICIAN:  Noralyn [name]. Eden Emms, MD, FACCDATE OF BIRTH:  Jan 10, 1951   DATE OF CONSULTATION:  DATE OF DISCHARGE:  06/22/2007                                 CONSULTATION   HISTORY OF PRESENT ILLNESS:  Chad [name] is a pleasant 60 year old  patient who came to the [place] office for a CAT scan today.  I was  asked to see him his DOD.  The patient had a markedly abnormal CAT scan.  He had bilateral pleural effusions, questionable left lower lobe  infiltrate, moderate pericardial effusion and significant  lymphadenopathy.  Talking to the patient, he has been sick for about 3  weeks.  He has had fevers fluctuating between 99 and 102.  He was seen  in the ER on the 26th.  He finished a 10-day course of Avelox last week.  He has seen Chad [name] in pulmonary and Chad [name] had started the  patient on Lasix and potassium and ordered his CAT scan.  Despite this  he has had a persistant cough, malaise and SOB.   The patient does have a history of asthma.  However, this does not  appear to be bronchospastic disease.   After the patient's CAT scan, I had him have a 2-D echocardiogram to  assess his pericardial effusion for tamponade.  He has normal LV  function.  He has a moderate pericardial effusion, however, his IVC is  flat.  There was no obvious evidence of tamponade.   I explained to the patient and I was concerned he has had somewhat of a  frustrating course having multiple outpatient visits to Urgent Care.  He  was seen in the ER on March 26 and seen by Chad [name], he needs  a uniform diagnosis.  He has had significant fevers, bilateral  effusions, pericardial effusion and possible lymphoma or saarcoid.  I  told him we admit him to the hospital for more expeditious workup.  He  will likely need a  thoracentesis and bronchoscopy.   Depending on his clinical course and diagnosis, he may need a right  heart cath to assess for elevated right-sided pressures.   PAST MEDICAL HISTORY:  Remarkable for:  1. GERD.  2. History of polyps.  3. Hypothyroidism.  4. Asthma.   FAMILY HISTORY:  Remarkable for father having an MI as well as prostate  cancer.  There is a history of colon cancer in his mother's side.  The  patient has been remarried for a year, he has older children.  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   SOCIAL HISTORY:  He is a previous smoker.   CURRENT MEDICATIONS:  He is currently on:  1. QVAR.  2. Nasonex.  3. Lasix 20 a day.  4. Klor-Con 10 a day.   PHYSICAL EXAMINATION:  GENERAL:  His exam is remarkable for a stout  middle-aged black  male in no distress.  VITAL SIGNS:  His blood pressure is 130/70, pulse is 88 and regular.  There is no obvious pulses.  LUNGS:  Have decreased breath sounds at both bases.  Diaphragmatic  motion is good.  CARDIAC:  There is an S1 and S2.  I cannot hear a rub.  He does appear  to Kussmaul sign with an elevated JVP during inspiration.  No carotid  bruits.  ABDOMEN:  Benign.  I cannot feel any hepatosplenomegaly.  Positive  reflux.  No AAA.  No bruits.  No tenderness.  EXTREMITIES:  Distal pulses are intact.  No edema.   I spent extensive amount of time reviewing the patient's CT scan. I  personally called Chad [name].  I spoke with Chad [name] at the  hospital and explained to him that I thought the patient needed to be  admitted.  We are able to get a telemetry bed for the patient and to  expedite matters, I will admit him to directly to the hospital.   IMPRESSION:  1. 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 The patient has already had a      course of Avelox.  We will have  pulmonary advise regarding      antibiotics once he is admitted.  Chad [name] will  see the patient      when he comes over to [facility]. We will check an ESR since his course      is more consistant with lymphoma or connective tissue disease.  2. Pericardial effusion, currently not in extremis.  However, I do      think he has a Kussmaul sign, we will have to do a followup echo      and possible right heart cath.  I will follow him in the hospital.  3. 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  I believe the patient has had a      traumatic thoracentesis, has had thoracentesis in the past after      trauma.  He did not seem excited about this, but I explained to him      that we needed a uniform diagnosis  4. Hypothyroidism.  He is not on replacement at this time, we will      check a TSH and T4. If his TSH is high this may be contributing to      his effusions      Chad Arista C. Eden Emms, MD, [facility]  Electronically Signed     PCN/MEDQ  D:  07/07/2007  T:  07/08/2007  Job:  [id number]

## 2010-08-13 NOTE — Op Note (Signed)
NAMESHEAN, GERDING NO.:  [id number]   MEDICAL RECORD NO.:  [id number]           PATIENT TYPE:   LOCATION:                                 FACILITY:   PHYSICIAN:  Kerin Perna, M.D.  DATE OF BIRTH:  06-Jul-1950   DATE OF PROCEDURE:  07/12/2007  DATE OF DISCHARGE:                               OPERATIVE REPORT   OPERATION:  Mediastinoscopy.   PREOPERATIVE DIAGNOSIS:  Mediastinal adenopathy associated with fever  and small bilateral pleural effusions.   POSTOPERATIVE DIAGNOSIS:  Mediastinal adenopathy associated with fever  and small bilateral pleural effusions.   ANESTHESIA:  General.   SURGEON:  Kerin Perna, MD   INDICATIONS FOR PROCEDURE:  The patient is a 60 year old male who was  admitted with dry cough, fever, bilateral pleural effusions, and a small-  to-moderate pericardial effusion.  Bronchoscopy and ultrasound direct  transbronchial biopsy was negative for diagnosis of some subcarinal  adenopathy.  It was felt that mediastinoscopy would be indicated to  obtain tissue to establish a diagnosis for this patient.  I discussed  the procedure with the patient including the issues of general  anesthesia, location of the surgical incisions, and the expected  recovery.  He understood the risks of infection, bleeding, and  pneumothorax.  After reviewing these issues, he demonstrated his  willingness to proceed with surgery and what I felt was an informed  consent.   PROCEDURE:  The patient was brought to operating room and placed supine  on the operating table where general anesthesia was induced.  The neck  and upper chest was prepped and draped as a sterile field.  A small  incision was made in the suprasternal notch and dissection taken down  through the soft tissue neck to the pretracheal plane.  The pretracheal  plane was developed with sharp and blunt dissection.  There were no  lymph nodes noted in the superior mediastinum or neck.  The  mediastinoscope was then passed along the pretracheal plane under the  innominate vessels and well into the thorax.  The right paratracheal  space was dissected extensively and found to have significant amounts of  mediastinal fat.  There appeared to be some lymphoid tissue in the fat.  This was biopsied at the 4-hour station.  There was also extensive  pretracheal mediastinal fat with some potential lymph node.  The tissue  in this was sent as a separate biopsy.  There was minimal bleeding which  was controlled with electrocautery unit.  After bleeding had been  stopped, the incision was closed in layers using 0 Vicryl and a  subcuticular suture for the skin.  He was returned to the recovery room  in stable condition.      Kerin Perna, M.D.  Electronically Signed    PV/MEDQ  D:  07/12/2007  T:  07/13/2007  Job:  [id number]

## 2010-08-13 NOTE — Procedures (Signed)
[facility]                              EXERCISE TREADMILL   NAME:Chad [name], Chad [name]                       MRN:          [id number]  DATE:01/10/2008                            DOB:          09/12/1950    The patient exercised for 6 minutes on a standard Bruce protocol.  Exercise stopped due to fatigue.  His maximum heart rate was 151 beats,  blood pressure 170/68.  Resting EKG was normal.  Stress, there was no  ischemia.   IMPRESSION:  Normal exercise stress test with a deconditioned response.     Noralyn Pick. Eden Emms, MD, [facility]  Electronically Signed    PCN/MedQ  DD: 01/10/2008  DT: 01/11/2008  Job #: [id number]

## 2010-08-13 NOTE — Discharge Summary (Signed)
NAMEJEYDAN, BARNER NO.:  [id number]   MEDICAL RECORD NO.:  [id number]          PATIENT TYPE:  INP   LOCATION:  3741                         FACILITY:  [facility]   PHYSICIAN:  Oretha Milch, MD      DATE OF BIRTH:  10/11/1950   DATE OF ADMISSION:  07/07/2007  DATE OF DISCHARGE:  07/16/2007                               DISCHARGE SUMMARY   DISCHARGE DIAGNOSES:  1. Venous lymphadenopathy, reactive in nature.  2. Presumed left lower lobe pneumonia.  3. Pericardial effusion.   HISTORY OF PRESENT ILLNESS:  [name] is a 60 year old African-  American gentleman who presented to initially the outpatient primary  care setting with cough. He was treated with a 10-day course of  antimicrobial therapy consisting of Avelox for presumed tracheal  bronchitis. His symptoms progressed. He developed low-grade intermittent  fevers, sometimes fevers as high as 102 degrees. He continued to have  increasing dyspnea on exertion. He eventually presented to Dr. Shan Levans where he was evaluated with a CT scan. It demonstrated  mediastinal lymphadenopathy. His condition continued to worsen.  Therefore, he was admitted for further evaluation and treatment.   PROCEDURES:  He had endoscopic ultrasound evaluation by Dr. Rob Bunting with biopsies x2 that were noninformative. That was on April 9.  On April 10, he had ultrasound evaluation for questionable thoracentesis  which demonstrated no fluid for thoracentesis. On April 14, he underwent  mediastinoscopy per Dr. Kathlee Nations Trigt with biopsies demonstrating  fibroid tissue without malignant cells. He had a 2-D echocardiogram that  showed an ejection fraction of 65% with a small pericardial effusion.   LABORATORY DATA:  Arterial blood gas 7.4, pCO2 of 43, pO2 90 with a  bicarb of 27.3. Hemoglobin 8.5, hematocrit 25.4, WBCs 9.8, platelets  277. Sodium 137, potassium 3.9, chloride 100, CO2 27, BUN 8, creatinine  0.92,  glucose 122, calcium 8.1. Rheumatoid factor was 24 which was high.  His ANA was negative.   HOSPITAL COURSE BY DISCHARGE DIAGNOSES:  1. Mediastinal lymphadenopathy found to be reactive. He underwent      evaluation utilizing endoscopic ultrasound along with a      mediastinoscopy which demonstrated no malignant cells. It will be      treated as presumed reactive with a two-week course of steroids. He      will follow up with Dr. Shan Levans on May 6 for further x-rays      to evaluate shrinkage of lymph nodes.  2. Presumed left lower lobe pneumonia. He has completed antimicrobial      therapy with Avelox.  3. Pericardial effusion. He was evaluated while in the hospital by the      cardiology service. He will follow up with Dr. Charlton Haws within      two to four weeks for repeat 2-D echocardiogram to evaluate size of      pericardial effusion.   DISCHARGE MEDICATIONS:  1. Prednisone 20-mg tablets 2 tablets a day until he sees Dr. Shan Levans.  2. Qvar 40 mcg 2 puffs 2  times a day.  3. Nasonex 50 mg 2 puffs 2 times a day.  4. Toprol-XL 25 mg 1 tablet a day. Note, this is a new medication.   His diet is low sodium, heart healthy.   WOUND CARE:  He had mediastinoscopy with Steri-Strips in place. He has  been instructed to clean with soap and water and leave Steri-Strips on  until they fall off on their accord. He has also been instructed to  follow up with Dr. Kathlee Nations Trigt if needed of cardiovascular thoracic  surgeon and follow up with Dr. Charlton Haws within two to four weeks.   DISPOSITION/CONDITION ON DISCHARGE:  Improved.      Chad Dopp, MSN, ACNP      Oretha Milch, MD  Electronically Signed    SM/MEDQ  D:  07/16/2007  T:  07/16/2007  Job:  [id number]   cc:   Noralyn Pick. Eden Emms, MD, [facility]  Rachael Fee, MD  Kerin Perna, M.D.  Barbette Hair. Artist Pais, DO

## 2010-08-13 NOTE — Consult Note (Signed)
NAMEJAYSHON, Chad [name] NO.:  [id number]   MEDICAL RECORD NO.:  [id number]           PATIENT TYPE:   LOCATION:                                 FACILITY:   PHYSICIAN:  Salvatore Decent. Cornelius Moras, M.D. DATE OF BIRTH:  [birth year]   DATE OF CONSULTATION:  07/11/2007  DATE OF DISCHARGE:                                 CONSULTATION   REQUESTING PHYSICIAN:  Clinton D. Maple Hudson, MD, FCCP, FACP   REASON FOR CONSULTATION:  Mediastinal lymphadenopathy.   HISTORY OF PRESENT ILLNESS:  Chad [name] is a 60 year old obese African  American male with past medical history notable for history of GE reflux  disease, asthma, obstructive sleep apnea.  The patient was in his usual  state of otherwise good health until approximately 3 weeks ago when he  first developed symptoms of progressive cough and shortness of breath.  [facility] and treated  with a 10-day course of oral Avelox for presumed tracheobronchitis.  His  symptoms progressed.  He developed intermittent low-grade fevers, at  times with fever as high as 102 degrees.  Shortness of breath  progressed.  Ultimately, the patient presented to [facility], and he was evaluated by Dr. Danise Mina.  A chest CT scan was  performed demonstrating mediastinal lymphadenopathy.  Since then, the  patient's symptoms have continued to worsen.  A repeat chest CT scan was  performed demonstrating progression of mediastinal lymphadenopathy with  small left pleural effusion and small pericardial effusion.  The patient  was admitted to the hospital on July 07, 2007, and underwent  bronchoscopy with endobronchial ultrasound and transbronchial needle  aspiration biopsy.  This was nondiagnostic.  Followup cultures obtained  today have been nondiagnostic.  Sedimentation rate is elevated.  Echocardiogram confirms the presence of small-to-moderate pericardial  effusion without any signs of pericardial  tamponade.  BNP levels are  low.  Thoracic surgery consultation has been requested to consider  mediastinoscopy.   PAST MEDICAL HISTORY:  1. Asthma.  2. Hypothyroidism.  3. GE reflux disease.  4. Obstructive sleep apnea.  5. Benign colon polyps.  6. Previous traumatic right pleural effusion with recurrence.   SOCIAL HISTORY:  The patient is retired from the post office, currently  works part-time as a Paediatric nurse.  He quit smoking in 2000.  He denies  significant alcohol consumption.  The patient denies any known exposure  to patients with unusual respiratory illnesses with tuberculosis.   FAMILY HISTORY:  Notable only in that the patient's father had  myocardial infarction.  There is no family history of sarcoidosis or  unusual thoracic malignancies.   DRUG ALLERGIES:  None known.   MEDICATIONS PRIOR TO ADMISSION:  1. QVAR 40 mcg inhale 2 puffs twice daily.  2. Nasonex 50 mcg 2 puffs daily.  3. Lasix 20 mg twice daily.  4. Potassium chloride 10 mEq twice daily.  5. Tussionex for cough.   REVIEW OF SYSTEMS:  Notable for the absence of any chest pain, chest  tightness, chest pressure either with activity or at rest.  The  remainder of the patient's review of systems has been extensively  detailed in his chart.   PHYSICAL EXAMINATION:  GENERAL:  The patient is a well-appearing, obese,  African American male who appears his stated age in no acute distress.  He is currently afebrile.  HEENT:  Unrevealing.  NECK:  There is no palpable lymphadenopathy.  There is no jugular venous  distention.  CHEST:  Auscultation of the chest reveals clear breath sounds which are  symmetrical bilaterally.  No wheezes or rhonchi noted.  CARDIOVASCULAR:  Regular rate and rhythm.  No murmurs, rubs, or gallops  noted.  ABDOMEN:  The abdomen is soft, nontender.  Bowel sounds are present.  There are no palpable masses.  EXTREMITIES:  Warm and well perfused.  There is no lower extremity  edema.  Distal  pulses are palpable in the posterior tibial position.  RECTAL AND GU:  Both deferred.  NEUROLOGIC:  Grossly nonfocal and symmetrical throughout.   LABORATORY DATA:  Hemoglobin 10.3, hematocrit 30%, white blood count  8600, platelet count 254,000.  Sodium 136, potassium 4.0, chloride 100,  bicarb 28, BUN 10, creatinine 0.9, glucose 132.  Prothrombin time 16.1  seconds, INR 1.3, PTT 36 seconds, SGOT 52, SGPT 79, alkaline phosphatase  92, total bilirubin 0.5.  Serum albumin 2.6, B natriuretic peptide level  less than 30, erythrocyte sedimentation rate 115.   RADIOGRAPHY:  Chest CT scan performed on July 07, 2007, is reviewed.  This demonstrates bilateral hilar and mediastinal lymphadenopathy.  There is a small pericardial effusion.  There is a small left pleural  effusion.  There are minor areas of atelectasis particularly in the left  lung base, no significant pulmonary mass lesions or other infiltrates  are identified.   IMPRESSION:  Three-week progressive illness with low-grade fevers,  cough, and shortness of breath with progressive mediastinal  lymphadenopathy and small pleural and pericardial effusions.  Mr.  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  Lymphoma, other malignancies, and atypcial infection cannot be ruled  out.  An attempted transbronchial needle aspiration biopsy has been  nondiagnostic.  I agree that mediastinoscopy would likely be the next  best course of action.   PLAN:  I have discussed matters briefly with Chad [name] and his wife.  I  have described technical details of mediastinoscopy to obtain definitive  tissue diagnosis for the patient's enlarged mediastinal lymph node.  We  have discussed the associated risks of the procedure.  All their  questions have been addressed.  We will tentatively make arrangements  for mediastinoscopy to be performed early this week by one of my  partners.      Salvatore Decent. Cornelius Moras, M.D.  Electronically  Signed    CHO/MEDQ  D:  07/11/2007  T:  07/11/2007  Job:  [id number]   cc:   Charlcie Cradle. Delford Field, MD, FCCP  Peter C. Eden Emms, MD, [facility]

## 2010-08-16 NOTE — Assessment & Plan Note (Signed)
[facility]                               PULMONARY OFFICE NOTE   NAME:HINSONChristerpher, Clos                       MRN:          [id number]  DATE:01/06/2006                            DOB:          1950/09/24    Mr. Broom is a 60 year old African-American male with previous history of  traumatic right pleural effusion. He underwent thoracentesis earlier in May.  Has not had recurrence since that time. He has minimal pain in the right  lower lobe; otherwise, doing well.   MEDICATIONS:  He is on no medications.   PHYSICAL EXAMINATION:  VITAL SIGNS:  Temperature 99, blood pressure 134/90,  pulse 110, saturation 98% on room air.  CHEST:  Showed diminished breath sounds without evidence of wheeze or  rhonchi.  CARDIAC:  Showed regular rate and rhythm without S3, normal S1, S2.  ABDOMEN:  Soft, nontender.  EXTREMITIES:  Showed no edema or clubbing.  SKIN:  Clear.   IMPRESSION:  Resolved right pleural effusion with no evidence of recurrence.  We will release this patient back to  Dr. Artist Pais for further primary care. Will see this patient back on return basis  as needed.       Charlcie Cradle Delford Field, MD, FCCP      PEW/MedQ  DD:  01/06/2006  DT:  01/08/2006  Job #:  [id number]   cc:   Barbette Hair. Artist Pais, DO

## 2010-08-16 NOTE — Assessment & Plan Note (Signed)
[facility]                             PULMONARY OFFICE NOTE   NAME:Chad [name], Chad [name]                       MRN:          [id number]  DATE:07/16/2006                            DOB:          October 03, 1950    [name] is seen today as a work in.  I have not seen this patient  since October of 2007.  He had fallen and had suffered injury to his  right lower lobe with pleural effusion which had resolved on his last  visit on October of 2007.  He developed, now, for the last 2 weeks,  cough, productive, clear mucous increased wheezing, no fever.  He was  given a Z-pak 2 weeks ago and is still having symptoms complex with  coughing of thick, light, yellow mucous, wheezing, but no real shortness  of breath.  He is on no systemic medications.   PHYSICAL EXAMINATION:  This is a well-developed, well-nourished African-  American male in no distress.  Temp 97.8, blood pressure 126/82, pulse 99, saturation was 96% on room  air.  CHEST:  Showed a few expiratory wheezes with poor airflow.  CARDIAC EXAM:  Showed a regular rate and rhythm without S3, normal S1,  S2.  ABDOMEN:  Soft, nontender.  EXTREMITIES:  Showed no edema or clubbing.  SKIN:  Clear.   Chest x-ray was obtained and reviewed and revealed no acute infiltrates.   IMPRESSION:  Impression on this patient is that of asthmatic bronchitis  with acute bronchitic flare.   PLAN:  Plan for this patient is to begin Qvar 2 sprays b.i.d. 40 mcg  strength, Nasonex 2 sprays each nostril daily.  He is to receive Avelox  400 mg a day for a 5-day course, and will return this patient back for a  recheck in 1 month.     Charlcie Cradle Delford Field, MD, [facility]  Electronically Signed    PEW/MedQ  DD: 07/16/2006  DT: 07/17/2006  Job #: [id number]   cc:   Barbette Hair. Artist Pais, DO

## 2010-10-29 ENCOUNTER — Encounter: Payer: Self-pay | Admitting: Internal Medicine

## 2010-11-26 ENCOUNTER — Encounter: Payer: Self-pay | Admitting: Internal Medicine

## 2010-11-26 ENCOUNTER — Ambulatory Visit (INDEPENDENT_AMBULATORY_CARE_PROVIDER_SITE_OTHER): Payer: PRIVATE HEALTH INSURANCE | Admitting: Internal Medicine

## 2010-11-26 VITALS — BP 124/80 | HR 110 | Temp 99.5°F | Resp 18 | Ht 74.0 in | Wt 298.0 lb

## 2010-11-26 DIAGNOSIS — J329 Chronic sinusitis, unspecified: Secondary | ICD-10-CM

## 2010-11-26 DIAGNOSIS — R070 Pain in throat: Secondary | ICD-10-CM

## 2010-11-26 HISTORY — DX: Chronic sinusitis, unspecified: J32.9

## 2010-11-26 LAB — POCT RAPID STREP A (OFFICE): Rapid Strep A Screen: NEGATIVE

## 2010-11-26 MED ORDER — AMOXICILLIN-POT CLAVULANATE 875-125 MG PO TABS: 1.0000 | ORAL_TABLET | Freq: Two times a day (BID) | ORAL | Status: AC

## 2010-11-26 NOTE — Assessment & Plan Note (Signed)
Sinusitis sx's predate recent fever and st (rapid strep obtained and reviewed with pt as neg). Begin abx to completion. Followup if no improvement or worsening.

## 2010-11-26 NOTE — Progress Notes (Signed)
  Subjective:    Patient ID: Chad [name]., male    DOB: 08-03-1950, 59 y.o.   MRN: [id number]  HPI Pt presents to clinic as a work in for evaluation of sore throat. Notes 2 day h/o fever (tmax 101.2) and ST but 2 month h/o sinus pressure (paranasal), drainage(green) and congestion. No tooth pain. Taking otc cold medication with some mild improvement. No known sick exposure. No alleviating or exacerbating factors. No other complaints.  Past Medical History  Diagnosis Date  . Colon polyp   . GERD (gastroesophageal reflux disease)   . Asthma   . Pleural effusion 2009    with neg w/u   . Pericardial effusion 2009    self resolved  . Tendonitis     left shoulder   Past Surgical History  Procedure Date  . Mediastinoscopy 2009    reports that he has quit smoking. He has never used smokeless tobacco. He reports that he drinks alcohol. He reports that he does not use illicit drugs. family history includes Cancer in his father; Diabetes in his daughter; and Heart attack in his father. Allergies  Allergen Reactions  . Ivp Dye (Iodinated Diagnostic Agents)      Review of Systems see hpi     Objective:   Physical Exam  Physical Exam  Nursing note and vitals reviewed. Constitutional: Appears well-developed and well-nourished. No distress.  HENT: op mild posterior erythema without exudate Head: Normocephalic and atraumatic.  Right Ear: External ear normal. Nl tm and canal Left Ear: External ear normal. nl tm and canal Eyes: Conjunctivae are normal. No scleral icterus.  Neck: Neck supple.  Cardiovascular: Normal rate, regular rhythm and normal heart sounds.  Exam reveals no gallop and no friction rub.   No murmur heard. Pulmonary/Chest: Effort normal and breath sounds normal. No respiratory distress. He has no wheezes. no rales.  Lymphadenopathy:    He has no cervical adenopathy.  Neurological:Alert.  Skin: Skin is warm and dry. Not diaphoretic.  Psychiatric: Has a normal mood  and affect.        Assessment & Plan:

## 2010-12-17 ENCOUNTER — Ambulatory Visit (INDEPENDENT_AMBULATORY_CARE_PROVIDER_SITE_OTHER): Payer: PRIVATE HEALTH INSURANCE | Admitting: Critical Care Medicine

## 2010-12-17 ENCOUNTER — Encounter: Payer: Self-pay | Admitting: Critical Care Medicine

## 2010-12-17 DIAGNOSIS — J45901 Unspecified asthma with (acute) exacerbation: Secondary | ICD-10-CM

## 2010-12-17 DIAGNOSIS — J019 Acute sinusitis, unspecified: Secondary | ICD-10-CM

## 2010-12-17 DIAGNOSIS — J45909 Unspecified asthma, uncomplicated: Secondary | ICD-10-CM

## 2010-12-17 DIAGNOSIS — J329 Chronic sinusitis, unspecified: Secondary | ICD-10-CM

## 2010-12-17 MED ORDER — MOXIFLOXACIN HCL 400 MG PO TABS: 400.0000 mg | ORAL_TABLET | Freq: Every day | ORAL | Status: AC

## 2010-12-17 MED ORDER — MOMETASONE FUROATE 220 MCG/INH IN AEPB: 2.0000 | INHALATION_SPRAY | Freq: Every day | RESPIRATORY_TRACT | Status: DC

## 2010-12-17 MED ORDER — PREDNISONE 10 MG PO TABS: ORAL_TABLET | ORAL | Status: DC

## 2010-12-17 NOTE — Patient Instructions (Signed)
Start prednisone 10mg  Take 4 for three days 3 for three days 2 for three days 1 for three days and stop Start Asmanex two puff daily Stay on flonase daily Start Avelox one daily for 7days Use saline nasal rinse twice daily Return 2 weeks [place]

## 2010-12-17 NOTE — Progress Notes (Signed)
Subjective:    Patient ID: Chad [name]., male    DOB: [birth year], 60 y.o.   MRN: [id number]  HPI This is a 60 y.o.  , African-American male, who have seen previously for traumatic bloody pleural effusion. That resolved early in 2007. Subsequent to this, the patient was seen back in 2008 with asthmatic bronchitic type symptoms. He was treated at that time with antibiotics and inhaled corticosteroid Qvar. Over a period time, he stopped the Qvar and appeared to be improved. The pt then developed over the winter fever to hundred degrees. He noted increased cough. Increased chest congestion. He went to the emergency room [facility]. Initially, he was given Avelox for 10 days, plus rocephin njection. His Avelox prescription was continued.  then the pt was seen in this office 06/24/07: At that office visit we restarted Qvar two inhalations twice daily. Patient finished his course of Avelox. He was maintained on the Nasonex. He then returned early 4/09 low-grade fever. He had no energy . 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 He was then admitted 4/09 for evaluation. THis included EBUS bx of LNs showing some giant cells. Subsequently the pt had mediastinoscopy complicated with vent support.  Bx showed no CA, no lymphoma and no granulomas. He was treated with corticosteroids and more avelox. A RLL infiltrate has since resolved. F/u echo shows resolution of pericardial effusion. He has less cough. no chest pain. No fever.  12/17/2010 Not seen since 2010. 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  Pericardial effusion has resolved.Pt off pred since 2010.  End of August 2012, sinusitis.  Saw urgent care rx amoxicillin,  No better.  Then saw ENT and culture pos two diff bacteria. Rx sulfa/pred. Then started coughing after meds for one week.  ?if side effect of sulfa.  Now not as much cough.  No sinus congestion.  No chest pain.  Occ wheeze noted.  Now is  better. Still wears cpap   Past Medical History  Diagnosis Date  . Colon polyp   . GERD (gastroesophageal reflux disease)   . Asthma   . Pleural effusion 2009    with neg w/u   . Pericardial effusion 2009    self resolved  . Tendonitis     left shoulder     Family History  Problem Relation Age of Onset  . Heart attack Father   . Cancer Father     prostate  . Diabetes Daughter      History   Social History  . Marital Status: Married    Spouse Name: N/A    Number of Children: 1  . Years of Education: N/A   Occupational History  . [employer]     part time   Social History Main Topics  . Smoking status: Former Smoker    Types: Cigars    Quit date: 04/17/2001  . Smokeless tobacco: Never Used   Comment: 1-2 cigars weekly  . Alcohol Use: Yes  . Drug Use: No  . Sexually Active: Not on file   Other Topics Concern  . Not on file   Social History Narrative   Patient states former smokerRetired from post officePart time barberMarried (2nd)Daughter from 1st marriage     Allergies  Allergen Reactions  . Ivp Dye (Iodinated Diagnostic Agents)      No outpatient prescriptions prior to visit.      Review of Systems Constitutional:   No  weight loss,  night sweats,  Fevers, chills, fatigue, lassitude. HEENT:   No headaches,  Difficulty swallowing,  Tooth/dental problems,  Sore throat,                No sneezing, itching, ear ache, nasal congestion, post nasal drip,   CV:  No chest pain,  Orthopnea, PND, swelling in lower extremities, anasarca, dizziness, palpitations  GI  No heartburn, indigestion, abdominal pain, nausea, vomiting, diarrhea, change in bowel habits, loss of appetite  Resp: No shortness of breath with exertion or at rest.  No excess mucus, no productive cough,  No non-productive cough,  No coughing up of blood.  No change in color of mucus.  No wheezing.  No chest wall deformity  Skin: no rash or lesions.  GU: no dysuria, change in color of urine, no  urgency or frequency.  No flank pain.  MS:  No joint pain or swelling.  No decreased range of motion.  No back pain.  Psych:  No change in mood or affect. No depression or anxiety.  No memory loss.     Objective:   Physical Exam  Filed Vitals:   12/17/10 1338  BP: 120/70  Pulse: 100  Temp: 98.6 F (37 C)  TempSrc: Oral  Height: 6\' 2"  (1.88 m)  Weight: 293 lb (132.904 kg)  SpO2: 99%    Gen: Pleasant, well-nourished, in no distress,  normal affect  ENTbilat nares purulence.   mouth clear,  oropharynx clear, +  postnasal drip  Neck: No JVD, no TMG, no carotid bruits  Lungs: No use of accessory muscles, no dullness to percussion, exp wheeze  Cardiovascular: RRR, heart sounds normal, no murmur or gallops, no peripheral edema  Abdomen: soft and NT, no HSM,  BS normal  Musculoskeletal: No deformities, no cyanosis or clubbing  Neuro: alert, non focal  Skin: Warm, no lesions or rashes       Assessment & Plan:   ASTHMA Acute on chronic sinusitis with asthma flare plan Start prednisone 10mg  Take 4 for three days 3 for three days 2 for three days 1 for three days and stop Start Asmanex two puff daily Stay on flonase daily Start Avelox one daily for 7days Use saline nasal rinse twice daily Return 2 weeks [place]   Sinusitis See asthma assessment     Updated Medication List Outpatient Encounter Prescriptions as of 12/17/2010  Medication Sig Dispense Refill  . fluticasone (FLONASE) 50 MCG/ACT nasal spray Place 2 sprays into the nose daily.      . mometasone (ASMANEX 120 METERED DOSES) 220 MCG/INH inhaler Inhale 2 puffs into the lungs daily.  1 Inhaler  6  . moxifloxacin (AVELOX) 400 MG tablet Take 1 tablet (400 mg total) by mouth daily.  7 tablet  0  . predniSONE (DELTASONE) 10 MG tablet Take 4 for three days 3 for three days 2 for three days 1 for three days and stop  30 tablet  0

## 2010-12-18 NOTE — Assessment & Plan Note (Signed)
See asthma assessment  

## 2010-12-18 NOTE — Assessment & Plan Note (Signed)
Acute on chronic sinusitis with asthma flare plan Start prednisone 10mg  Take 4 for three days 3 for three days 2 for three days 1 for three days and stop Start Asmanex two puff daily Stay on flonase daily Start Avelox one daily for 7days Use saline nasal rinse twice daily Return 2 weeks [place]

## 2010-12-23 LAB — COMPREHENSIVE METABOLIC PANEL
ALT: 60 — ABNORMAL HIGH
AST: 55 — ABNORMAL HIGH
Albumin: 3.1 — ABNORMAL LOW
CO2: 27
Calcium: 8.9
GFR calc Af Amer: >60
GFR calc non Af Amer: >60
Sodium: 137
Total Protein: 8.6 — ABNORMAL HIGH

## 2010-12-23 LAB — CBC
MCHC: 34.1
Platelets: 176
RBC: 3.85 — ABNORMAL LOW

## 2010-12-23 LAB — DIFFERENTIAL
Eosinophils Absolute: 0
Eosinophils Relative: 0
Lymphocytes Relative: 12
Lymphs Abs: 1.2
Monocytes Absolute: 0.9
Monocytes Relative: 8

## 2010-12-24 LAB — B-NATRIURETIC PEPTIDE (CONVERTED LAB): Pro B Natriuretic peptide (BNP): <30

## 2010-12-24 LAB — BASIC METABOLIC PANEL
BUN: 10
BUN: 8
CO2: 29
Calcium: 8.8
Chloride: 100
Chloride: 100
Creatinine, Ser: 0.89
Creatinine, Ser: 0.9
GFR calc non Af Amer: >60
GFR calc non Af Amer: >60
Glucose, Bld: 132 — ABNORMAL HIGH
Glucose, Bld: 98
Potassium: 3.9
Sodium: 137

## 2010-12-24 LAB — RHEUMATOID FACTOR: Rhuematoid fact SerPl-aCnc: 24 — ABNORMAL HIGH

## 2010-12-24 LAB — PROTIME-INR
INR: 1.3
Prothrombin Time: 16.1 — ABNORMAL HIGH

## 2010-12-24 LAB — STREP PNEUMONIAE URINARY ANTIGEN: Strep Pneumo Urinary Antigen: NEGATIVE

## 2010-12-24 LAB — COMPREHENSIVE METABOLIC PANEL
ALT: 79 — ABNORMAL HIGH
AST: 52 — ABNORMAL HIGH
Albumin: 2.6 — ABNORMAL LOW
CO2: 31
Chloride: 100
GFR calc Af Amer: >60
GFR calc non Af Amer: >60
Potassium: 4.5
Sodium: 139
Total Bilirubin: 0.5

## 2010-12-24 LAB — T4: T4, Total: 7.9

## 2010-12-24 LAB — CBC
HCT: 25.4 — ABNORMAL LOW
Hemoglobin: 8.5 — ABNORMAL LOW
MCHC: 34.5
MCV: 85.9
MCV: 87.1
MCV: 87.6
Platelets: 254
Platelets: 277
RBC: 3.46 — ABNORMAL LOW
RDW: 15
RDW: 15.1
WBC: 7.6
WBC: 8.6
WBC: 9.8

## 2010-12-24 LAB — POCT I-STAT 3, ART BLOOD GAS (G3+)
Acid-Base Excess: 2
Bicarbonate: 27.3 — ABNORMAL HIGH
O2 Saturation: 97
TCO2: 29
pCO2 arterial: 43.7
pO2, Arterial: 90

## 2010-12-24 LAB — URINALYSIS, ROUTINE W REFLEX MICROSCOPIC
Bilirubin Urine: NEGATIVE
Hgb urine dipstick: NEGATIVE
Ketones, ur: NEGATIVE
Nitrite: NEGATIVE
Specific Gravity, Urine: 1.022
pH: 6

## 2010-12-24 LAB — DIFFERENTIAL
Basophils Absolute: 0
Basophils Relative: 0
Eosinophils Absolute: 0.1
Monocytes Absolute: 0.9
Monocytes Relative: 9
Neutro Abs: 7.1
Neutrophils Relative %: 76

## 2010-12-24 LAB — CULTURE, BLOOD (ROUTINE X 2): Culture: NO GROWTH

## 2010-12-24 LAB — CARDIAC PANEL(CRET KIN+CKTOT+MB+TROPI)
CK, MB: 1.3
Total CK: 174

## 2010-12-24 LAB — LEGIONELLA ANTIGEN, URINE: Legionella Antigen, Urine: NEGATIVE

## 2010-12-24 LAB — TYPE AND SCREEN

## 2010-12-24 LAB — EXPECTORATED SPUTUM ASSESSMENT W GRAM STAIN, RFLX TO RESP C

## 2010-12-24 LAB — TSH: TSH: 2.185

## 2010-12-24 LAB — ABO/RH: ABO/RH(D): B POS

## 2010-12-24 LAB — LACTATE DEHYDROGENASE: LDH: 229

## 2010-12-30 ENCOUNTER — Ambulatory Visit: Payer: PRIVATE HEALTH INSURANCE | Admitting: Critical Care Medicine

## 2011-01-02 ENCOUNTER — Ambulatory Visit (INDEPENDENT_AMBULATORY_CARE_PROVIDER_SITE_OTHER): Payer: PRIVATE HEALTH INSURANCE | Admitting: Critical Care Medicine

## 2011-01-02 ENCOUNTER — Encounter: Payer: Self-pay | Admitting: Critical Care Medicine

## 2011-01-02 ENCOUNTER — Ambulatory Visit (HOSPITAL_BASED_OUTPATIENT_CLINIC_OR_DEPARTMENT_OTHER)
Admission: RE | Admit: 2011-01-02 | Discharge: 2011-01-02 | Disposition: A | Discharge status: Home / Self Care | Payer: PRIVATE HEALTH INSURANCE | Source: Ambulatory Visit | Attending: Critical Care Medicine | Admitting: Critical Care Medicine

## 2011-01-02 VITALS — BP 110/68 | HR 76 | Temp 97.9°F | Ht 74.0 in | Wt 287.0 lb

## 2011-01-02 DIAGNOSIS — R599 Enlarged lymph nodes, unspecified: Secondary | ICD-10-CM | POA: Insufficient documentation

## 2011-01-02 DIAGNOSIS — R590 Localized enlarged lymph nodes: Secondary | ICD-10-CM

## 2011-01-02 DIAGNOSIS — J45909 Unspecified asthma, uncomplicated: Secondary | ICD-10-CM

## 2011-01-02 DIAGNOSIS — Z23 Encounter for immunization: Secondary | ICD-10-CM

## 2011-01-02 NOTE — Assessment & Plan Note (Signed)
Hx of Mediastinal LAN 2009 S/p mediastinoscopy with neg pathology, no granuloma or Ca or lymphoma Pericardial effusion associated, now resolved  cxr to monitor

## 2011-01-02 NOTE — Assessment & Plan Note (Signed)
Moderate persistent asthma with recent sinusitis flare Now improved Plan Cont ICS with asmanex daily Cont Inhaled nasal steroid CXR to monitor mediastinal LAN

## 2011-01-02 NOTE — Progress Notes (Signed)
Quick Note:  Notify the patient that the Xray is stable and lymphadenopathy seen No change in medications are recommended. Continue current meds as prescribed at last office visit ______

## 2011-01-02 NOTE — Progress Notes (Signed)
Subjective:    Patient ID: Chad [name]., male    DOB: [birth year], 60 y.o.   MRN: [id number]  HPI  This is a 60 y.o.  , African-American male, who have seen previously for traumatic bloody pleural effusion. That resolved early in 2007. Subsequent to this, the patient was seen back in 2008 with asthmatic bronchitic type symptoms. He was treated at that time with antibiotics and inhaled corticosteroid Qvar. Over a period time, he stopped the Qvar and appeared to be improved. The pt then developed over the winter fever to hundred degrees. He noted increased cough. Increased chest congestion. He went to the emergency room after having been to [facility]. Initially, he was given Avelox for 10 days, plus rocephin njection. His Avelox prescription was continued.  then the pt was seen in this office 06/24/07: At that office visit we restarted Qvar two inhalations twice daily. Patient finished his course of Avelox. He was maintained on the Nasonex. He then returned early 4/09 low-grade fever. He had no energy . 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 He was then admitted 4/09 for evaluation. THis included EBUS bx of LNs showing some giant cells. Subsequently the pt had mediastinoscopy complicated with vent support.  Bx showed no CA, no lymphoma and no granulomas. He was treated with corticosteroids and more avelox. A RLL infiltrate has since resolved. F/u echo shows resolution of pericardial effusion. He has less cough. no chest pain. No fever.  9/18 Not seen since 2010. 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  Pericardial effusion has resolved.Pt off pred since 2010.  End of August 2012, sinusitis.  Saw urgent care rx amoxicillin,  No better.  Then saw ENT and culture pos two diff bacteria. Rx sulfa/pred. Then started coughing after meds for one week.  ?if side effect of sulfa.  Now not as much cough.  No sinus congestion.  No chest pain.  Occ wheeze noted.  Now is  better. Still wears cpap   01/02/2011 Now is better, no cough at all.  No real chest pain.   No wheeze.   No edema in the feet.  Past Medical History  Diagnosis Date  . Colon polyp   . GERD (gastroesophageal reflux disease)   . Asthma   . Pleural effusion 2009    with neg w/u   . Pericardial effusion 2009    self resolved  . Tendonitis     left shoulder     Family History  Problem Relation Age of Onset  . Heart attack Father   . Cancer Father     prostate  . Diabetes Daughter      History   Social History  . Marital Status: Married    Spouse Name: N/A    Number of Children: 1  . Years of Education: N/A   Occupational History  . [employer]     part time   Social History Main Topics  . Smoking status: Former Smoker    Types: Cigars    Quit date: 04/17/2001  . Smokeless tobacco: Never Used   Comment: 1-2 cigars weekly  . Alcohol Use: Yes  . Drug Use: No  . Sexually Active: Not on file   Other Topics Concern  . Not on file   Social History Narrative   Patient states former smokerRetired from post officePart time barberMarried (2nd)Daughter from 1st marriage     Allergies  Allergen Reactions  . Ivp Dye (Iodinated Diagnostic Agents)  Outpatient Prescriptions Prior to Visit  Medication Sig Dispense Refill  . fluticasone (FLONASE) 50 MCG/ACT nasal spray Place 2 sprays into the nose daily.      . mometasone (ASMANEX 120 METERED DOSES) 220 MCG/INH inhaler Inhale 2 puffs into the lungs daily.  1 Inhaler  6  . predniSONE (DELTASONE) 10 MG tablet Take 4 for three days 3 for three days 2 for three days 1 for three days and stop  30 tablet  0      Review of Systems  Constitutional:   No  weight loss, night sweats,  Fevers, chills, fatigue, lassitude. HEENT:   No headaches,  Difficulty swallowing,  Tooth/dental problems,  Sore throat,                No sneezing, itching, ear ache, nasal congestion, post nasal drip,   CV:  No chest pain,  Orthopnea, PND,  swelling in lower extremities, anasarca, dizziness, palpitations  GI  No heartburn, indigestion, abdominal pain, nausea, vomiting, diarrhea, change in bowel habits, loss of appetite  Resp: No shortness of breath with exertion or at rest.  No excess mucus, no productive cough,  No non-productive cough,  No coughing up of blood.  No change in color of mucus.  No wheezing.  No chest wall deformity  Skin: no rash or lesions.  GU: no dysuria, change in color of urine, no urgency or frequency.  No flank pain.  MS:  No joint pain or swelling.  No decreased range of motion.  No back pain.  Psych:  No change in mood or affect. No depression or anxiety.  No memory loss.     Objective:   Physical Exam   Filed Vitals:   01/02/11 1004  BP: 110/68  Pulse: 76  Temp: 97.9 F (36.6 C)  TempSrc: Oral  Height: 6\' 2"  (1.88 m)  Weight: 287 lb (130.182 kg)  SpO2: 100%    Gen: Pleasant, well-nourished, in no distress,  normal affect  ENTbilat nares purulence.   mouth clear,  oropharynx clear, +  postnasal drip  Neck: No JVD, no TMG, no carotid bruits  Lungs: No use of accessory muscles, no dullness to percussion, exp wheeze  Cardiovascular: RRR, heart sounds normal, no murmur or gallops, no peripheral edema  Abdomen: soft and NT, no HSM,  BS normal  Musculoskeletal: No deformities, no cyanosis or clubbing  Neuro: alert, non focal  Skin: Warm, no lesions or rashes       Assessment & Plan:   No problem-specific assessment & plan notes found for this encounter.   Updated Medication List Outpatient Encounter Prescriptions as of 01/02/2011  Medication Sig Dispense Refill  . fluticasone (FLONASE) 50 MCG/ACT nasal spray Place 2 sprays into the nose daily.      . mometasone (ASMANEX 120 METERED DOSES) 220 MCG/INH inhaler Inhale 2 puffs into the lungs daily.  1 Inhaler  6  . mupirocin (BACTROBAN) 2 % ointment Place into the nose Daily.      Marland Kitchen DISCONTD: predniSONE (DELTASONE) 10 MG tablet  Take 4 for three days 3 for three days 2 for three days 1 for three days and stop  30 tablet  0

## 2011-01-02 NOTE — Patient Instructions (Signed)
Stay on asmanex  When current flonase runs out may use as needed Return 4 months Chest xray today, will call results Flu vaccine today

## 2011-01-13 NOTE — Progress Notes (Signed)
Quick Note:  Called, spoke with pt. I informed him of xray results as stated below per PW. I also informed him PW recs no change in meds and to cont with meds as prescribed at last OV. Pt verbalized his understanding of these results and recs and voiced no further questions/concerns at this time. ______

## 2011-08-05 ENCOUNTER — Other Ambulatory Visit (INDEPENDENT_AMBULATORY_CARE_PROVIDER_SITE_OTHER): Payer: PRIVATE HEALTH INSURANCE

## 2011-08-05 DIAGNOSIS — Z0000 Encounter for general adult medical examination without abnormal findings: Secondary | ICD-10-CM

## 2011-08-05 LAB — BASIC METABOLIC PANEL
CO2: 27 mEq/L (ref 19–32)
Calcium: 8.9 mg/dL (ref 8.4–10.5)
Chloride: 106 mEq/L (ref 96–112)
Glucose, Bld: 95 mg/dL (ref 70–99)
Potassium: 4.7 mEq/L (ref 3.5–5.1)
Sodium: 141 mEq/L (ref 135–145)

## 2011-08-05 LAB — HEPATIC FUNCTION PANEL
ALT: 19 U/L (ref 0–53)
AST: 22 U/L (ref 0–37)
Albumin: 3.3 g/dL — ABNORMAL LOW (ref 3.5–5.2)
Alkaline Phosphatase: 53 U/L (ref 39–117)
Total Protein: 7.2 g/dL (ref 6.0–8.3)

## 2011-08-05 LAB — POCT URINALYSIS DIPSTICK
Ketones, UA: NEGATIVE
Protein, UA: NEGATIVE
Spec Grav, UA: 1.02
Urobilinogen, UA: 0.2
pH, UA: 5.5

## 2011-08-05 LAB — CBC WITH DIFFERENTIAL/PLATELET
Basophils Absolute: 0 10*3/uL (ref 0.0–0.1)
Basophils Relative: 0.5 % (ref 0.0–3.0)
Eosinophils Absolute: 0.9 10*3/uL — ABNORMAL HIGH (ref 0.0–0.7)
Eosinophils Relative: 11.2 % — ABNORMAL HIGH (ref 0.0–5.0)
HCT: 37.4 % — ABNORMAL LOW (ref 39.0–52.0)
Hemoglobin: 12.2 g/dL — ABNORMAL LOW (ref 13.0–17.0)
Lymphocytes Relative: 30.9 % (ref 12.0–46.0)
Lymphs Abs: 2.4 10*3/uL (ref 0.7–4.0)
MCHC: 32.7 g/dL (ref 30.0–36.0)
MCV: 90.2 fl (ref 78.0–100.0)
Monocytes Absolute: 0.6 10*3/uL (ref 0.1–1.0)
Monocytes Relative: 7.8 % (ref 3.0–12.0)
Neutro Abs: 3.8 10*3/uL (ref 1.4–7.7)
Neutrophils Relative %: 49.6 % (ref 43.0–77.0)
Platelets: 146 10*3/uL — ABNORMAL LOW (ref 150.0–400.0)
RBC: 4.14 Mil/uL — ABNORMAL LOW (ref 4.22–5.81)
RDW: 16.4 % — ABNORMAL HIGH (ref 11.5–14.6)
WBC: 7.7 10*3/uL (ref 4.5–10.5)

## 2011-08-05 LAB — TSH: TSH: 2.89 u[IU]/mL (ref 0.35–5.50)

## 2011-08-05 LAB — PSA: PSA: 5.69 ng/mL — ABNORMAL HIGH (ref 0.10–4.00)

## 2011-08-07 ENCOUNTER — Other Ambulatory Visit: Payer: PRIVATE HEALTH INSURANCE

## 2011-08-11 ENCOUNTER — Encounter: Payer: PRIVATE HEALTH INSURANCE | Admitting: Internal Medicine

## 2011-08-13 ENCOUNTER — Ambulatory Visit (INDEPENDENT_AMBULATORY_CARE_PROVIDER_SITE_OTHER): Payer: PRIVATE HEALTH INSURANCE | Admitting: Internal Medicine

## 2011-08-13 ENCOUNTER — Encounter: Payer: Self-pay | Admitting: Internal Medicine

## 2011-08-13 VITALS — BP 120/80 | HR 68 | Temp 98.7°F | Ht 73.0 in | Wt 301.0 lb

## 2011-08-13 DIAGNOSIS — R972 Elevated prostate specific antigen [PSA]: Secondary | ICD-10-CM

## 2011-08-13 DIAGNOSIS — J45909 Unspecified asthma, uncomplicated: Secondary | ICD-10-CM

## 2011-08-13 DIAGNOSIS — J019 Acute sinusitis, unspecified: Secondary | ICD-10-CM

## 2011-08-13 DIAGNOSIS — D696 Thrombocytopenia, unspecified: Secondary | ICD-10-CM

## 2011-08-13 DIAGNOSIS — J45901 Unspecified asthma with (acute) exacerbation: Secondary | ICD-10-CM

## 2011-08-13 DIAGNOSIS — Z0000 Encounter for general adult medical examination without abnormal findings: Secondary | ICD-10-CM

## 2011-08-13 HISTORY — DX: Elevated prostate specific antigen (PSA): R97.20

## 2011-08-13 MED ORDER — BECLOMETHASONE DIPROPIONATE 80 MCG/ACT IN AERS: 2.0000 | INHALATION_SPRAY | Freq: Two times a day (BID) | RESPIRATORY_TRACT | Status: DC

## 2011-08-13 NOTE — Assessment & Plan Note (Signed)
Reviewed adult health maintenance protocols. Patient is up-to-date with adult vaccines.  He declines Zostavax. Weight loss strongly encouraged. Goal weight loss 30 pounds over the next 6 months.  Patient also up-to-date with colonoscopy.

## 2011-08-13 NOTE — Assessment & Plan Note (Signed)
61 year old [place] American male with elevated PSA and family history of prostate cancer. Limited prostate exam. There were no palpable nodules or asymmetry. Refer to urologist for further evaluation.  Lab Results  Component Value Date   PSA 5.69* 08/05/2011

## 2011-08-13 NOTE — Assessment & Plan Note (Signed)
Patient has not been using Asmanex on a regular basis. It is cost prohibitive. Switch to Qvar. I stressed importance of using maintenance inhaler.

## 2011-08-13 NOTE — Patient Instructions (Addendum)
Our office will contact you re: urology referral Try to lose 30 lbs within the next 6 months (Call our office if you want to proceed with consultation with nutritionist) Please complete the following lab tests before your next follow up appointment: [facility] - [phone]

## 2011-08-13 NOTE — Progress Notes (Signed)
Subjective:    Patient ID: Chad [name]., male    DOB: [birth year], 61 y.o.   MRN: [id number]  HPI  61 year old African American male with history of asthma, sinusitis and obesity for routine physical. Patient denies significant interval history. He was recently seen by Dr. Delford Field.  Patient experiencing intermittent cough. He is not using his maintenance inhaler regularly.  No change in weight. 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  Lab results reviewed in detail. His PSA is elevated. He is not having any symptoms of BPH. Father known to have prostate cancer.   Review of Systems   Constitutional: Negative for activity change, appetite change and unexpected weight change.  Eyes: Negative for visual disturbance.  Respiratory: Positive for cough Cardiovascular: Negative for chest pain.  Genitourinary: Negative for difficulty urinating.  Neurological: Negative for headaches.  Gastrointestinal: occasional constipation, hx of anal fissure Psych: Negative for depression or anxiety Endo:  No polyuria or polydypsia    Past Medical History  Diagnosis Date  . Colon polyp   . GERD (gastroesophageal reflux disease)   . Asthma   . Pleural effusion 2009    with neg w/u   . Pericardial effusion 2009    self resolved  . Tendonitis     left shoulder  . Mediastinal lymphadenopathy 2009    Mediastinoscopy : no granuloma or lymphoma, resolved with steroids    History   Social History  . Marital Status: Married    Spouse Name: N/A    Number of Children: 1  . Years of Education: N/A   Occupational History  . [employer]     part time   Social History Main Topics  . Smoking status: Former Smoker    Types: Cigars    Quit date: 04/17/2001  . Smokeless tobacco: Never Used   Comment: 1-2 cigars weekly  . Alcohol Use: Yes  . Drug Use: No  . Sexually Active: Not on file   Other Topics Concern  . Not on file   Social History Narrative   Patient states  former smokerRetired from post officePart time barberMarried (2nd)Daughter from 1st marriage    Past Surgical History  Procedure Date  . Mediastinoscopy 2009    neg biopsy    Family History  Problem Relation Age of Onset  . Heart attack Father   . Cancer Father     prostate  . Diabetes Daughter     Allergies  Allergen Reactions  . Ivp Dye (Iodinated Diagnostic Agents)     Current Outpatient Prescriptions on File Prior to Visit  Medication Sig Dispense Refill  . beclomethasone (QVAR) 80 MCG/ACT inhaler Inhale 2 puffs into the lungs 2 (two) times daily.  1 Inhaler  5  . fluticasone (FLONASE) 50 MCG/ACT nasal spray Place 2 sprays into the nose daily.        BP 120/80  Pulse 68  Temp(Src) 98.7 F (37.1 C) (Oral)  Ht 6\' 1"  (1.854 m)  Wt 301 lb (136.533 kg)  BMI 39.71 kg/m2       Objective:   Physical Exam  Constitutional: He is oriented to person, place, and time.       Pleasant, obese 61 y/o  HENT:  Head: Normocephalic and atraumatic.  Right Ear: External ear normal.  Left Ear: External ear normal.  Mouth/Throat: Oropharynx is clear and moist.  Neck: Normal range of motion. Neck supple.       No carotid bruit  Cardiovascular: Normal  rate, regular rhythm, normal heart sounds and intact distal pulses.   No murmur heard. Pulmonary/Chest: Effort normal and breath sounds normal. He has no wheezes. He has no rales.  Abdominal: Soft. Bowel sounds are normal. He exhibits no distension and no mass.  Genitourinary: Rectum normal and prostate normal. Guaiac negative stool.  Musculoskeletal: He exhibits edema.  Lymphadenopathy:    He has no cervical adenopathy.  Neurological: He is alert and oriented to person, place, and time. No cranial nerve deficit. Coordination normal.  Skin: Skin is warm and dry.  Psychiatric: He has a normal mood and affect. His behavior is normal.        Assessment & Plan:

## 2011-10-16 ENCOUNTER — Telehealth: Payer: Self-pay | Admitting: Internal Medicine

## 2011-10-16 NOTE — Telephone Encounter (Signed)
Pt is requesting coupons for qvar

## 2011-10-17 NOTE — Telephone Encounter (Signed)
None available, pt aware 

## 2011-11-06 ENCOUNTER — Ambulatory Visit (INDEPENDENT_AMBULATORY_CARE_PROVIDER_SITE_OTHER): Payer: PRIVATE HEALTH INSURANCE | Admitting: Critical Care Medicine

## 2011-11-06 ENCOUNTER — Encounter: Payer: Self-pay | Admitting: Critical Care Medicine

## 2011-11-06 ENCOUNTER — Ambulatory Visit (HOSPITAL_BASED_OUTPATIENT_CLINIC_OR_DEPARTMENT_OTHER)
Admission: RE | Admit: 2011-11-06 | Discharge: 2011-11-06 | Disposition: A | Discharge status: Home / Self Care | Payer: PRIVATE HEALTH INSURANCE | Source: Ambulatory Visit | Attending: Critical Care Medicine | Admitting: Critical Care Medicine

## 2011-11-06 VITALS — BP 110/72 | HR 103 | Temp 98.0°F | Ht 74.0 in | Wt 288.0 lb

## 2011-11-06 DIAGNOSIS — J45909 Unspecified asthma, uncomplicated: Secondary | ICD-10-CM

## 2011-11-06 DIAGNOSIS — R059 Cough, unspecified: Secondary | ICD-10-CM | POA: Insufficient documentation

## 2011-11-06 DIAGNOSIS — R05 Cough: Secondary | ICD-10-CM | POA: Insufficient documentation

## 2011-11-06 DIAGNOSIS — R918 Other nonspecific abnormal finding of lung field: Secondary | ICD-10-CM | POA: Insufficient documentation

## 2011-11-06 MED ORDER — PREDNISONE 10 MG PO TABS: ORAL_TABLET | ORAL | Status: DC

## 2011-11-06 MED ORDER — AMOXICILLIN-POT CLAVULANATE 875-125 MG PO TABS: 1.0000 | ORAL_TABLET | Freq: Two times a day (BID) | ORAL | Status: AC

## 2011-11-06 MED ORDER — MOMETASONE FUROATE 220 MCG/INH IN AEPB: 2.0000 | INHALATION_SPRAY | Freq: Every day | RESPIRATORY_TRACT | Status: DC

## 2011-11-06 NOTE — Patient Instructions (Addendum)
Prednisone 10mg  Take 4 tablets daily for 5 days then stop Augmentin one two times daily Both above Rx sent to pharmacy downstairs Stop qvar when current inhaler runs out and change back to Asmanex two puff daily, see samples and printed Rx for refills A Chest xray will be obtained Return 2 months

## 2011-11-06 NOTE — Progress Notes (Signed)
Subjective:    Patient ID: Chad [name]., male    DOB: 03-23-1951, 61 y.o.   MRN: [id number]  HPI  This is a 60 y.o.  , African-American male, who have seen previously for traumatic bloody pleural effusion. That resolved early in 2007. Subsequent to this, the patient was seen back in 2008 with asthmatic bronchitic type symptoms. He was treated at that time with antibiotics and inhaled corticosteroid Qvar. Over a period time, he stopped the Qvar and appeared to be improved. The pt then developed over the winter fever to hundred degrees. He noted increased cough. Increased chest congestion. He went to the emergency room after having been to [facility]. Initially, he was given Avelox for 10 days, plus rocephin njection. His Avelox prescription was continued.  then the pt was seen in this office 06/24/07: At that office visit we restarted Qvar two inhalations twice daily. Patient finished his course of Avelox. He was maintained on the Nasonex. He then returned early 4/09 low-grade fever. He had no energy . 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 He was then admitted 4/09 for evaluation. THis included EBUS bx of LNs showing some giant cells. Subsequently the pt had mediastinoscopy complicated with vent support.  Bx showed no CA, no lymphoma and no granulomas. He was treated with corticosteroids and more avelox. A RLL infiltrate has since resolved. F/u echo shows resolution of pericardial effusion. He has less cough. no chest pain. No fever.  9/18 Not seen since 2010. 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  Pericardial effusion has resolved.Pt off pred since 2010.  End of August 2012, sinusitis.  Saw urgent care rx amoxicillin,  No better.  Then saw ENT and culture pos two diff bacteria. Rx sulfa/pred. Then started coughing after meds for one week.  ?if side effect of sulfa.  Now not as much cough.  No sinus congestion.  No chest pain.  Occ wheeze noted.  Now is  better. Still wears cpap   10/12 Now is better, no cough at all.  No real chest pain.   No wheeze.   No edema in the feet.  11/06/2011 Since last ov for three weeks more cough and pain in Left chest . Pain is sharp and worse with deep breath.  Cough is productive mucus sl yellow.  Notes some nasal drainage,  flonase helps. More dyspnea with exertion, ok at rest. No f/c/s.  No palpitations   Past Medical History  Diagnosis Date  . Colon polyp   . GERD (gastroesophageal reflux disease)   . Asthma   . Pleural effusion 2009    with neg w/u   . Pericardial effusion 2009    self resolved  . Tendonitis     left shoulder  . Mediastinal lymphadenopathy 2009    Mediastinoscopy : no granuloma or lymphoma, resolved with steroids     Family History  Problem Relation Age of Onset  . Heart attack Father   . Cancer Father     prostate  . Diabetes Daughter      History   Social History  . Marital Status: Married    Spouse Name: N/A    Number of Children: 1  . Years of Education: N/A   Occupational History  . [employer]     part time   Social History Main Topics  . Smoking status: Former Smoker -- 3 years    Types: Cigars    Quit date: 04/17/2001  .  Smokeless tobacco: Never Used   Comment: 1-2 cigars occasionally x 2-3 yrs  . Alcohol Use: Yes  . Drug Use: No  . Sexually Active: Not on file   Other Topics Concern  . Not on file   Social History Narrative   Patient states former smokerRetired from post officePart time barberMarried (2nd)Daughter from 1st marriage     Allergies  Allergen Reactions  . Ivp Dye (Iodinated Diagnostic Agents)      Outpatient Prescriptions Prior to Visit  Medication Sig Dispense Refill  . fluticasone (FLONASE) 50 MCG/ACT nasal spray Place 2 sprays into the nose daily.      . beclomethasone (QVAR) 80 MCG/ACT inhaler Inhale 2 puffs into the lungs 2 (two) times daily.  1 Inhaler  5      Review of Systems  Constitutional:   No  weight loss,  night sweats,  Fevers, chills, fatigue, lassitude. HEENT:   No headaches,  Difficulty swallowing,  Tooth/dental problems,  Sore throat,                No sneezing, itching, ear ache, nasal congestion, post nasal drip,   CV:  No chest pain,  Orthopnea, PND, swelling in lower extremities, anasarca, dizziness, palpitations  GI  No heartburn, indigestion, abdominal pain, nausea, vomiting, diarrhea, change in bowel habits, loss of appetite  Resp: No shortness of breath with exertion or at rest.  No excess mucus, no productive cough,  No non-productive cough,  No coughing up of blood.  No change in color of mucus.  No wheezing.  No chest wall deformity  Skin: no rash or lesions.  GU: no dysuria, change in color of urine, no urgency or frequency.  No flank pain.  MS:  No joint pain or swelling.  No decreased range of motion.  No back pain.  Psych:  No change in mood or affect. No depression or anxiety.  No memory loss.     Objective:   Physical Exam   Filed Vitals:   11/06/11 1210  BP: 110/72  Pulse: 103  Temp: 98 F (36.7 C)  TempSrc: Oral  Height: 6\' 2"  (1.88 m)  Weight: 288 lb (130.636 kg)  SpO2: 95%    Gen: Pleasant, well-nourished, in no distress,  normal affect  ENT   mouth clear,  oropharynx clear, +  postnasal drip  Neck: No JVD, no TMG, no carotid bruits  Lungs: No use of accessory muscles, no dullness to percussion, exp wheeze  Cardiovascular: RRR, heart sounds normal, no murmur or gallops, no peripheral edema  Abdomen: soft and NT, no HSM,  BS normal  Musculoskeletal: No deformities, no cyanosis or clubbing  Neuro: alert, non focal  Skin: Warm, no lesions or rashes       Assessment & Plan:   ASTHMA Asthmatic bronchitis with flare .  LLL infiltrate noted on CXR  ?early PNA Plan Prednisone 10mg  Take 4 tablets daily for 5 days then stop Augmentin one two times daily Both above Rx sent to pharmacy downstairs Stop qvar when current inhaler runs out and  change back to Asmanex two puff daily, see samples and printed Rx for refills     Updated Medication List Outpatient Encounter Prescriptions as of 11/06/2011  Medication Sig Dispense Refill  . cyanocobalamin 2000 MCG tablet Take 2,000 mcg by mouth as needed.      . fluticasone (FLONASE) 50 MCG/ACT nasal spray Place 2 sprays into the nose daily.      Marland Kitchen ibuprofen (ADVIL,MOTRIN) 200 MG  tablet Take 600 mg by mouth every 6 (six) hours as needed.      Marland Kitchen DISCONTD: beclomethasone (QVAR) 80 MCG/ACT inhaler Inhale 2 puffs into the lungs 2 (two) times daily.  1 Inhaler  5  . amoxicillin-clavulanate (AUGMENTIN) 875-125 MG per tablet Take 1 tablet by mouth 2 (two) times daily.  20 tablet  0  . mometasone (ASMANEX 120 METERED DOSES) 220 MCG/INH inhaler Inhale 2 puffs into the lungs daily.  1 Inhaler  12  . predniSONE (DELTASONE) 10 MG tablet Take 4 tablets daily for 5 days then stop  20 tablet  0

## 2011-11-07 NOTE — Assessment & Plan Note (Signed)
Asthmatic bronchitis with flare .  LLL infiltrate noted on CXR  ?early PNA Plan Prednisone 10mg  Take 4 tablets daily for 5 days then stop Augmentin one two times daily Both above Rx sent to pharmacy downstairs Stop qvar when current inhaler runs out and change back to Asmanex two puff daily, see samples and printed Rx for refills

## 2011-11-13 ENCOUNTER — Ambulatory Visit (INDEPENDENT_AMBULATORY_CARE_PROVIDER_SITE_OTHER)
Admission: RE | Admit: 2011-11-13 | Discharge: 2011-11-13 | Disposition: A | Discharge status: Home / Self Care | Payer: PRIVATE HEALTH INSURANCE | Source: Ambulatory Visit | Attending: Pulmonary Disease | Admitting: Pulmonary Disease

## 2011-11-13 ENCOUNTER — Encounter: Payer: Self-pay | Admitting: Pulmonary Disease

## 2011-11-13 ENCOUNTER — Ambulatory Visit (INDEPENDENT_AMBULATORY_CARE_PROVIDER_SITE_OTHER): Payer: PRIVATE HEALTH INSURANCE | Admitting: Pulmonary Disease

## 2011-11-13 ENCOUNTER — Telehealth: Payer: Self-pay | Admitting: Critical Care Medicine

## 2011-11-13 VITALS — BP 122/70 | HR 95 | Temp 98.3°F | Ht 74.0 in | Wt 287.6 lb

## 2011-11-13 DIAGNOSIS — J189 Pneumonia, unspecified organism: Secondary | ICD-10-CM

## 2011-11-13 DIAGNOSIS — J45909 Unspecified asthma, uncomplicated: Secondary | ICD-10-CM

## 2011-11-13 HISTORY — DX: Pneumonia, unspecified organism: J18.9

## 2011-11-13 MED ORDER — ALBUTEROL SULFATE HFA 108 (90 BASE) MCG/ACT IN AERS: 2.0000 | INHALATION_SPRAY | Freq: Four times a day (QID) | RESPIRATORY_TRACT | Status: DC | PRN

## 2011-11-13 NOTE — Assessment & Plan Note (Signed)
He is to continue asmanex.  I have arranged for him to get a proair inhaler, and gave him a sample of this also.

## 2011-11-13 NOTE — Patient Instructions (Signed)
Finish antibiotics from Dr. Jose Persia two puffs as needed for cough, wheeze, or chest congestion Continue asmanex Follow up with Dr. Delford Field in 2 weeks

## 2011-11-13 NOTE — Progress Notes (Signed)
Chief Complaint  Patient presents with  . acute visit    PW pt. Pt c/o increase SOB w/ exertion, chest tx, feels sluggish, hoarseness. occasional cough. denies any wheezing, He started asmanex 3 days ago after finishing QVAR    History of Present Illness: Chad [name]. is a 61 y.o. male with asthma  He is an acute visit.  He is followed by Dr. Delford Field.  He was seen by Dr. Delford Field on 8/08.  He was started on augmentin and prednisone.  He has some improvement since then.  He still feels fatigued and tired.  He says this is like he felt when he had pneumonia.  He denies headache, sore throat, gland swelling, rash, fever, leg swelling, or abdominal symptoms.  He denies chest pain, wheeze, or hemoptysis.   Past Medical History  Diagnosis Date  . Colon polyp   . GERD (gastroesophageal reflux disease)   . Asthma   . Pleural effusion 2009    with neg w/u   . Pericardial effusion 2009    self resolved  . Tendonitis     left shoulder  . Mediastinal lymphadenopathy 2009    Mediastinoscopy : no granuloma or lymphoma, resolved with steroids    Past Surgical History  Procedure Date  . Mediastinoscopy 2009    neg biopsy    Outpatient Encounter Prescriptions as of 11/13/2011  Medication Sig Dispense Refill  . amoxicillin-clavulanate (AUGMENTIN) 875-125 MG per tablet Take 1 tablet by mouth 2 (two) times daily.  20 tablet  0  . cyanocobalamin 2000 MCG tablet Take 2,000 mcg by mouth as needed.      . fluticasone (FLONASE) 50 MCG/ACT nasal spray Place 2 sprays into the nose daily.      Marland Kitchen ibuprofen (ADVIL,MOTRIN) 200 MG tablet Take 600 mg by mouth every 6 (six) hours as needed.      . mometasone (ASMANEX 120 METERED DOSES) 220 MCG/INH inhaler Inhale 2 puffs into the lungs daily.  1 Inhaler  12  . albuterol (PROAIR HFA) 108 (90 BASE) MCG/ACT inhaler Inhale 2 puffs into the lungs every 6 (six) hours as needed for wheezing.  1 Inhaler  3  . DISCONTD: predniSONE (DELTASONE) 10 MG tablet Take  4 tablets daily for 5 days then stop  20 tablet  0    Allergies  Allergen Reactions  . Ivp Dye (Iodinated Diagnostic Agents)     Physical Exam:  Filed Vitals:   11/13/11 1413 11/13/11 1415  BP:  122/70  Pulse:  95  Temp: 98.3 F (36.8 C)   TempSrc: Oral   Height: 6\' 2"  (1.88 m)   Weight: 287 lb 9.6 oz (130.455 kg)   SpO2:  95%    Current Encounter SPO2  11/13/11 1415 95%  11/06/11 1210 95%  01/02/11 1004 100%     Body mass index is 36.93 kg/(m^2). Wt Readings from Last 2 Encounters:  11/13/11 287 lb 9.6 oz (130.455 kg)  11/06/11 288 lb (130.636 kg)    General - No distress ENT - TM clear, mild cerumen build up, no sinus tenderness, no oral exudate, no LAN, no thyromegaly Cardiac - s1s2 regular, no murmur, pulses symmetric, no edema Chest - normal respiratory excursion, good air entry, no wheeze/rales/dullness Back - no focal tenderness Abd - soft, non-tender, no organomegaly, + bowel sounds Ext - normal motor strength Neuro - Cranial nerves are normal. PERLA. EOM's intact. Skin - no discernible active dermatitis, erythema, urticaria or inflammatory process. Psych - normal mood, and behavior.  Dg Chest 2 View  11/13/2011  *RADIOLOGY REPORT*  Clinical Data: Follow-up left base opacity.  Shortness of breath, asthma.  CHEST - 2 VIEW  Comparison: 11/06/2011  Findings: Bibasilar opacities are noted, unchanged on the left, slightly increased on the right.  This could represent atelectasis or infiltrates.  Heart is normal size.  No effusions.  No acute bony abnormality.  IMPRESSION: Bibasilar atelectasis or infiltrates, stable on the left, increased on the right.  Original Report Authenticated By: Cyndie Chime, M.D.   Dg Chest 2 View  11/06/2011  *RADIOLOGY REPORT*  Clinical Data: Cough  CHEST - 2 VIEW  Comparison: 01/02/2011  Findings: The heart and pulmonary vascularity are within normal limits.  There is early infiltrate in the left lung base.  No sizable effusion is  seen.  The osseous structures are within normal limits.  IMPRESSION: Left basilar infiltrate  Original Report Authenticated By: Phillips Odor, M.D.   Assessment/Plan:  Chad Helling, MD [facility] Pager:  [phone] 11/13/2011, 2:46 PM

## 2011-11-13 NOTE — Assessment & Plan Note (Addendum)
He is clinically improving after recent course of prednisone and antibiotics.  I explained that he could have fatigue for several weeks.  His current xray findings on the right are more likely related to atelectasis.  No change in findings on left.    I advised him to finish course of antibiotics.   I don't think he needs additional prednisone.  Will have him follow up with Tammy Parrett or Dr. Delford Field in 2 weeks.

## 2011-11-13 NOTE — Telephone Encounter (Signed)
I spoke with pt and he stated he spoke with Dr. Delford Field on 11/06/11 about his CXR and stated he had some inflammation. He was calling to give dr. Delford Field an update on how he was feeling. Pt states he has 2 days left of the Augmentin and has already finished the prednisone. He is still feeling SOB, chest is tight, and little "sluggish". Denies any cough or wheezing. He is using his asmanex inhaler 2 puffs daily. VS had an opening today at 2:15. Pt is coming in at that time to be seen. Will forward to Dr. Delford Field so he is aware.

## 2011-11-17 ENCOUNTER — Other Ambulatory Visit: Payer: Self-pay | Admitting: Pulmonary Disease

## 2011-11-17 ENCOUNTER — Telehealth: Payer: Self-pay | Admitting: Critical Care Medicine

## 2011-11-17 DIAGNOSIS — J45909 Unspecified asthma, uncomplicated: Secondary | ICD-10-CM

## 2011-11-17 MED ORDER — MOXIFLOXACIN HCL 400 MG PO TABS: 400.0000 mg | ORAL_TABLET | Freq: Every day | ORAL | Status: AC

## 2011-11-17 NOTE — Telephone Encounter (Signed)
Called and spoke with pt and he is aware that the avelox has been sent in to the pharmacy for the pt and appt has been made for the pt to see TP next Wednesday 8-28 at 9:45 and the pt is aware to come in early and have cxr prior to his appt with TP.  Nothing further is needed.  Order has been placed for the cxr.

## 2011-11-17 NOTE — Telephone Encounter (Signed)
Need to Rx: Avelox 400mg  /d x 7days Needs OV with CXR first in next week

## 2011-11-17 NOTE — Telephone Encounter (Signed)
Spoke with pt. He states calling to give an update to PW. He states finished augmentin a couple of days ago, and his breathing is only somewhat better, and is not back at baseline yet. He also states has developed a cough over the past 2 days,esp at hs and is prod with moderate white to light yellow sputum. Wants to know what the next step is. Please advise, thanks!

## 2011-11-25 ENCOUNTER — Ambulatory Visit: Payer: PRIVATE HEALTH INSURANCE | Admitting: Adult Health

## 2011-11-25 ENCOUNTER — Ambulatory Visit (INDEPENDENT_AMBULATORY_CARE_PROVIDER_SITE_OTHER): Payer: PRIVATE HEALTH INSURANCE | Admitting: Adult Health

## 2011-11-25 ENCOUNTER — Ambulatory Visit (HOSPITAL_BASED_OUTPATIENT_CLINIC_OR_DEPARTMENT_OTHER)
Admission: RE | Admit: 2011-11-25 | Discharge: 2011-11-25 | Disposition: A | Discharge status: Home / Self Care | Payer: PRIVATE HEALTH INSURANCE | Source: Ambulatory Visit | Attending: Adult Health | Admitting: Adult Health

## 2011-11-25 ENCOUNTER — Ambulatory Visit: Payer: PRIVATE HEALTH INSURANCE | Admitting: Pulmonary Disease

## 2011-11-25 ENCOUNTER — Encounter: Payer: Self-pay | Admitting: Adult Health

## 2011-11-25 VITALS — BP 124/78 | HR 82 | Temp 98.2°F | Ht 74.0 in | Wt 290.0 lb

## 2011-11-25 DIAGNOSIS — R059 Cough, unspecified: Secondary | ICD-10-CM | POA: Insufficient documentation

## 2011-11-25 DIAGNOSIS — J189 Pneumonia, unspecified organism: Secondary | ICD-10-CM | POA: Insufficient documentation

## 2011-11-25 DIAGNOSIS — R05 Cough: Secondary | ICD-10-CM | POA: Insufficient documentation

## 2011-11-25 MED ORDER — MOXIFLOXACIN HCL 400 MG PO TABS: 400.0000 mg | ORAL_TABLET | Freq: Every day | ORAL | Status: AC

## 2011-11-25 NOTE — Progress Notes (Signed)
Subjective:    Patient ID: Chad [name]., male    DOB: 1950-07-26, 61 y.o.   MRN: [id number]  HPI  This is a 61 y.o.  , African-American male, who have seen previously for traumatic bloody pleural effusion. That resolved early in 2007. Subsequent to this, the patient was seen back in 2008 with asthmatic bronchitic type symptoms. He was treated at that time with antibiotics and inhaled corticosteroid Qvar. Over a period time, he stopped the Qvar and appeared to be improved. The pt then developed over the winter fever to hundred degrees. He noted increased cough. Increased chest congestion. He went to the emergency room after having been to [facility]. Initially, he was given Avelox for 10 days, plus rocephin njection. His Avelox prescription was continued.  then the pt was seen in this office 06/24/07: At that office visit we restarted Qvar two inhalations twice daily. Patient finished his course of Avelox. He was maintained on the Nasonex. He then returned early 4/09 low-grade fever. He had no energy . 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 He was then admitted 4/09 for evaluation. THis included EBUS bx of LNs showing some giant cells. Subsequently the pt had mediastinoscopy complicated with vent support.  Bx showed no CA, no lymphoma and no granulomas. He was treated with corticosteroids and more avelox. A RLL infiltrate has since resolved. F/u echo shows resolution of pericardial effusion. He has less cough. no chest pain. No fever.  9/18 Not seen since 2010. 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  Pericardial effusion has resolved.Pt off pred since 2010.  End of August 2012, sinusitis.  Saw urgent care rx amoxicillin,  No better.  Then saw ENT and culture pos two diff bacteria. Rx sulfa/pred. Then started coughing after meds for one week.  ?if side effect of sulfa.  Now not as much cough.  No sinus congestion.  No chest pain.  Occ wheeze noted.  Now is  better. Still wears cpap   10/12 Now is better, no cough at all.  No real chest pain.   No wheeze.   No edema in the feet.  11/06/2011 Since last ov for three weeks more cough and pain in Left chest . Pain is sharp and worse with deep breath.  Cough is productive mucus sl yellow.  Notes some nasal drainage,  flonase helps. More dyspnea with exertion, ok at rest. No f/c/s.  No palpitations >>augmentin rx , CXR ? LLL infiltrate   11/25/2011 Follow up Pneumonia  Returns for follow up of LLL Pneumonia . He was seen 3 weeks ago for bronchitic like symptoms with cough and congestion. Started on Augmentin. CXR showed LLL infiltrate Returned to office 1 week later not improving , CXR showed bilateral infiltrates R>L  Started on Avelox x 7 days -finished last dose yesterday  He returns today finished with meds.  He is feeling better.  Still has some cough and nasal congestion  No hemoptysis or fever  Appetite is improved.  CXR today shows slight improvement in bibasilar infiltrates      Past Medical History  Diagnosis Date  . Colon polyp   . GERD (gastroesophageal reflux disease)   . Asthma   . Pleural effusion 2009    with neg w/u   . Pericardial effusion 2009    self resolved  . Tendonitis     left shoulder  . Mediastinal lymphadenopathy 2009    Mediastinoscopy : no granuloma or lymphoma,  resolved with steroids     Family History  Problem Relation Age of Onset  . Heart attack Father   . Cancer Father     prostate  . Diabetes Daughter      History   Social History  . Marital Status: Married    Spouse Name: N/A    Number of Children: 1  . Years of Education: N/A   Occupational History  . [employer]     part time   Social History Main Topics  . Smoking status: Former Smoker -- 3 years    Types: Cigars    Quit date: 04/17/2001  . Smokeless tobacco: Never Used   Comment: 1-2 cigars occasionally x 2-3 yrs  . Alcohol Use: Yes  . Drug Use: No  . Sexually Active: Not on  file   Other Topics Concern  . Not on file   Social History Narrative   Patient states former smokerRetired from post officePart time barberMarried (2nd)Daughter from 1st marriage     Allergies  Allergen Reactions  . Ivp Dye (Iodinated Diagnostic Agents)      Outpatient Prescriptions Prior to Visit  Medication Sig Dispense Refill  . albuterol (PROAIR HFA) 108 (90 BASE) MCG/ACT inhaler Inhale 2 puffs into the lungs every 6 (six) hours as needed for wheezing.  1 Inhaler  3  . cyanocobalamin 2000 MCG tablet Take 2,000 mcg by mouth as needed.      . fluticasone (FLONASE) 50 MCG/ACT nasal spray Place 2 sprays into the nose daily.      Marland Kitchen ibuprofen (ADVIL,MOTRIN) 200 MG tablet Take 600 mg by mouth every 6 (six) hours as needed.      . mometasone (ASMANEX 120 METERED DOSES) 220 MCG/INH inhaler Inhale 2 puffs into the lungs daily.  1 Inhaler  12  . moxifloxacin (AVELOX) 400 MG tablet Take 1 tablet (400 mg total) by mouth daily.  7 tablet  0      Review of Systems  Constitutional:   No  weight loss, night sweats,  Fevers, chills, + fatigue, lassitude. HEENT:   No headaches,  Difficulty swallowing,  Tooth/dental problems,  Sore throat,                No sneezing, itching, ear ache,  +nasal congestion, post nasal drip,   CV:  No chest pain,  Orthopnea, PND, swelling in lower extremities, anasarca, dizziness, palpitations  GI  No heartburn, indigestion, abdominal pain, nausea, vomiting, diarrhea, change in bowel habits, loss of appetite  Resp:  No coughing up of blood.  No change in color of mucus.  No wheezing.  No chest wall deformity  Skin: no rash or lesions.  GU: no dysuria, change in color of urine, no urgency or frequency.  No flank pain.  MS:  No joint pain or swelling.  No decreased range of motion.  No back pain.  Psych:  No change in mood or affect. No depression or anxiety.  No memory loss.     Objective:   Physical Exam   Filed Vitals:   11/25/11 1419  BP:  124/78  Pulse: 82  Temp: 98.2 F (36.8 C)  TempSrc: Oral  Height: 6\' 2"  (1.88 m)  Weight: 290 lb (131.543 kg)  SpO2: 93%    Gen: Pleasant, well-nourished, in no distress,  normal affect  ENT   mouth clear,  oropharynx clear,no   postnasal drip  Neck: No JVD, no TMG, no carotid bruits  Lungs: No use of accessory muscles, no  dullness to percussion  Cardiovascular: RRR, heart sounds normal, no murmur or gallops, no peripheral edema  Abdomen: soft and NT, no HSM,  BS normal  Musculoskeletal: No deformities, no cyanosis or clubbing  Neuro: alert, non focal  Skin: Warm, no lesions or rashes       Assessment & Plan:   No problem-specific assessment & plan notes found for this encounter.   Updated Medication List Outpatient Encounter Prescriptions as of 11/25/2011  Medication Sig Dispense Refill  . albuterol (PROAIR HFA) 108 (90 BASE) MCG/ACT inhaler Inhale 2 puffs into the lungs every 6 (six) hours as needed for wheezing.  1 Inhaler  3  . cyanocobalamin 2000 MCG tablet Take 2,000 mcg by mouth as needed.      . fluticasone (FLONASE) 50 MCG/ACT nasal spray Place 2 sprays into the nose daily.      Marland Kitchen ibuprofen (ADVIL,MOTRIN) 200 MG tablet Take 600 mg by mouth every 6 (six) hours as needed.      . mometasone (ASMANEX 120 METERED DOSES) 220 MCG/INH inhaler Inhale 2 puffs into the lungs daily.  1 Inhaler  12  . moxifloxacin (AVELOX) 400 MG tablet Take 1 tablet (400 mg total) by mouth daily.  7 tablet  0

## 2011-11-25 NOTE — Assessment & Plan Note (Signed)
Bibasilar PNA  CXR show some improvement  Clinically he is some better  Will extend abx for total of 10 day coarse.   Plan;  Extend Avelox 400mg  daily for 3 days .   Advance activity as tolerated follow up Dr. Delford Field  In 3 weeks with chest xray at [facility]  Please contact office for sooner follow up if symptoms do not improve or worsen or seek emergency care

## 2011-11-25 NOTE — Patient Instructions (Addendum)
Extend Avelox 400mg  daily for 3 days .   Advance activity as tolerated follow up Dr. Delford Field  In 3 weeks with chest xray at [facility]  Please contact office for sooner follow up if symptoms do not improve or worsen or seek emergency care

## 2011-11-26 ENCOUNTER — Ambulatory Visit: Payer: PRIVATE HEALTH INSURANCE | Admitting: Adult Health

## 2011-12-09 ENCOUNTER — Telehealth: Payer: Self-pay | Admitting: Critical Care Medicine

## 2011-12-09 NOTE — Telephone Encounter (Signed)
Pt called back again. I advised him that nurse will call him when this can be scheduled. Hazel Sams

## 2011-12-09 NOTE — Telephone Encounter (Signed)
I spoke with pt and he is requesting to switch from Dr. Delford Field to Dr. Shelle Iron. He stated he saw [facility] in the past (2010) for his OSA and really liked him. Pt is aware of protocol for switching physicians. Please advise Dr. Delford Field. Thank You

## 2011-12-09 NOTE — Telephone Encounter (Signed)
This is ok with me  

## 2011-12-09 NOTE — Telephone Encounter (Signed)
Dr. Clance please advise thanks 

## 2011-12-10 NOTE — Telephone Encounter (Signed)
Ok with me, but will need to see me as a pulmonary consult in a slot.

## 2011-12-11 ENCOUNTER — Ambulatory Visit (INDEPENDENT_AMBULATORY_CARE_PROVIDER_SITE_OTHER): Payer: PRIVATE HEALTH INSURANCE | Admitting: Adult Health

## 2011-12-11 ENCOUNTER — Encounter: Payer: Self-pay | Admitting: Adult Health

## 2011-12-11 ENCOUNTER — Other Ambulatory Visit (INDEPENDENT_AMBULATORY_CARE_PROVIDER_SITE_OTHER): Payer: PRIVATE HEALTH INSURANCE

## 2011-12-11 ENCOUNTER — Ambulatory Visit (INDEPENDENT_AMBULATORY_CARE_PROVIDER_SITE_OTHER)
Admission: RE | Admit: 2011-12-11 | Discharge: 2011-12-11 | Disposition: A | Discharge status: Home / Self Care | Payer: PRIVATE HEALTH INSURANCE | Source: Ambulatory Visit | Attending: Adult Health | Admitting: Adult Health

## 2011-12-11 VITALS — BP 108/70 | HR 93 | Temp 97.7°F | Ht 74.0 in | Wt 290.4 lb

## 2011-12-11 DIAGNOSIS — J189 Pneumonia, unspecified organism: Secondary | ICD-10-CM

## 2011-12-11 DIAGNOSIS — J45909 Unspecified asthma, uncomplicated: Secondary | ICD-10-CM

## 2011-12-11 LAB — CBC WITH DIFFERENTIAL/PLATELET
Basophils Relative: 0.2 % (ref 0.0–3.0)
Eosinophils Absolute: 0.3 10*3/uL (ref 0.0–0.7)
Lymphocytes Relative: 23.4 % (ref 12.0–46.0)
MCHC: 32.8 g/dL (ref 30.0–36.0)
Neutrophils Relative %: 65.6 % (ref 43.0–77.0)
RBC: 4.55 Mil/uL (ref 4.22–5.81)
WBC: 9.6 10*3/uL (ref 4.5–10.5)

## 2011-12-11 LAB — BASIC METABOLIC PANEL
CO2: 23 mEq/L (ref 19–32)
Calcium: 8.9 mg/dL (ref 8.4–10.5)
Creatinine, Ser: 0.7 mg/dL (ref 0.4–1.5)
GFR: 139.73 mL/min (ref >60.00)

## 2011-12-11 MED ORDER — HYDROCODONE-HOMATROPINE 5-1.5 MG/5ML PO SYRP: 5.0000 mL | ORAL_SOLUTION | ORAL | Status: AC | PRN

## 2011-12-11 NOTE — Progress Notes (Signed)
Subjective:    Patient ID: Chad [name]., male    DOB: 03-Aug-1950, 61 y.o.   MRN: [id number]  HPI  This is a 61 y.o.  , African-American male, who have seen previously for traumatic bloody pleural effusion. That resolved early in 2007. Subsequent to this, the patient was seen back in 2008 with asthmatic bronchitic type symptoms. He was treated at that time with antibiotics and inhaled corticosteroid Qvar. Over a period time, he stopped the Qvar and appeared to be improved. The pt then developed over the winter fever to hundred degrees. He noted increased cough. Increased chest congestion. He went to the emergency room after having been to [facility]. Initially, he was given Avelox for 10 days, plus rocephin njection. His Avelox prescription was continued.  then the pt was seen in this office 06/24/07: At that office visit we restarted Qvar two inhalations twice daily. Patient finished his course of Avelox. He was maintained on the Nasonex. He then returned early 4/09 low-grade fever. He had no energy . 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 He was then admitted 4/09 for evaluation. THis included EBUS bx of LNs showing some giant cells. Subsequently the pt had mediastinoscopy complicated with vent support.  Bx showed no CA, no lymphoma and no granulomas. He was treated with corticosteroids and more avelox. A RLL infiltrate has since resolved. F/u echo shows resolution of pericardial effusion. He has less cough. no chest pain. No fever.  9/18 Not seen since 2010. 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  Pericardial effusion has resolved.Pt off pred since 2010.  End of August 2012, sinusitis.  Saw urgent care rx amoxicillin,  No better.  Then saw ENT and culture pos two diff bacteria. Rx sulfa/pred. Then started coughing after meds for one week.  ?if side effect of sulfa.  Now not as much cough.  No sinus congestion.  No chest pain.  Occ wheeze noted.  Now is  better. Still wears cpap   10/12 Now is better, no cough at all.  No real chest pain.   No wheeze.   No edema in the feet.  11/06/2011 Since last ov for three weeks more cough and pain in Left chest . Pain is sharp and worse with deep breath.  Cough is productive mucus sl yellow.  Notes some nasal drainage,  flonase helps. More dyspnea with exertion, ok at rest. No f/c/s.  No palpitations >>augmentin rx , CXR ? LLL infiltrate    8/27 Follow up Pneumonia  Returns for follow up of LLL Pneumonia . He was seen 3 weeks ago for bronchitic like symptoms with cough and congestion. Started on Augmentin. CXR showed LLL infiltrate Returned to office 1 week later not improving , CXR showed bilateral infiltrates R>L  Started on Avelox x 7 days -finished last dose yesterday  He returns today finished with meds.  He is feeling better.  Still has some cough and nasal congestion  No hemoptysis or fever  Appetite is improved.  CXR today shows slight improvement in bibasilar infiltrates >>extended avelox x 3 d  12/11/2011 Follow up  Still has cough and congestion that has not totally went away . Does have SOB, tightness, chest discomfort w/ coughing, occ wheezing, cough occ producing light yellow/clear mucus x10days, worse the past 4 days.   Tx initially with Augmentin x 7 days beginning 8/8 then no better w/ CXR showing bilateral infiltrates  Started on Avelox for total of 10  days  CXR on 8/27 with improvement in bibasilar infiltrates Today xray w/ no change .  No hemoptysis, wt loss, recent travel. No water parks/hot tub use.  Appetite is good. No n/v/d Does have sinus drainage.      Past Medical History  Diagnosis Date  . Colon polyp   . GERD (gastroesophageal reflux disease)   . Asthma   . Pleural effusion 2009    with neg w/u   . Pericardial effusion 2009    self resolved  . Tendonitis     left shoulder  . Mediastinal lymphadenopathy 2009    Mediastinoscopy : no granuloma or lymphoma,  resolved with steroids     Family History  Problem Relation Age of Onset  . Heart attack Father   . Cancer Father     prostate  . Diabetes Daughter      History   Social History  . Marital Status: Married    Spouse Name: N/A    Number of Children: 1  . Years of Education: N/A   Occupational History  . [employer]     part time   Social History Main Topics  . Smoking status: Former Smoker -- 3 years    Types: Cigars    Quit date: 04/17/2001  . Smokeless tobacco: Never Used   Comment: 1-2 cigars occasionally x 2-3 yrs  . Alcohol Use: Yes  . Drug Use: No  . Sexually Active: Not on file   Other Topics Concern  . Not on file   Social History Narrative   Patient states former smokerRetired from post officePart time barberMarried (2nd)Daughter from 1st marriage     Allergies  Allergen Reactions  . Ivp Dye (Iodinated Diagnostic Agents)      Outpatient Prescriptions Prior to Visit  Medication Sig Dispense Refill  . albuterol (PROAIR HFA) 108 (90 BASE) MCG/ACT inhaler Inhale 2 puffs into the lungs every 6 (six) hours as needed for wheezing.  1 Inhaler  3  . cyanocobalamin 2000 MCG tablet Take 2,000 mcg by mouth as needed.      . fluticasone (FLONASE) 50 MCG/ACT nasal spray Place 2 sprays into the nose daily.      Marland Kitchen ibuprofen (ADVIL,MOTRIN) 200 MG tablet Take 600 mg by mouth every 6 (six) hours as needed.      . mometasone (ASMANEX 120 METERED DOSES) 220 MCG/INH inhaler Inhale 2 puffs into the lungs daily.  1 Inhaler  12      Review of Systems  Constitutional:   No  weight loss, night sweats,  Fevers, chills, + fatigue, lassitude. HEENT:   No headaches,  Difficulty swallowing,  Tooth/dental problems,  Sore throat,                No sneezing, itching, ear ache,  +nasal congestion, post nasal drip,   CV:  No chest pain,  Orthopnea, PND, swelling in lower extremities, anasarca, dizziness, palpitations  GI  No heartburn, indigestion, abdominal pain, nausea, vomiting,  diarrhea, change in bowel habits, loss of appetite  Resp:  No coughing up of blood.     No chest wall deformity  Skin: no rash or lesions.  GU: no dysuria, change in color of urine, no urgency or frequency.  No flank pain.  MS:  No joint pain or swelling.  No decreased range of motion.  No back pain.  Psych:  No change in mood or affect. No depression or anxiety.  No memory loss.     Objective:  Physical Exam   There were no vitals filed for this visit.  Gen: Pleasant, well-nourished, in no distress,  normal affect  ENT   mouth clear,  oropharynx clear,no   postnasal drip  Neck: No JVD, no TMG, no carotid bruits  Lungs: No use of accessory muscles, no dullness to percussion, coarse BS   Cardiovascular: RRR, heart sounds normal, no murmur or gallops, no peripheral edema  Abdomen: soft and NT, no HSM,  BS normal  Musculoskeletal: No deformities, no cyanosis or clubbing  Neuro: alert, non focal  Skin: Warm, no lesions or rashes       Assessment & Plan:   No problem-specific assessment & plan notes found for this encounter.   Updated Medication List Outpatient Encounter Prescriptions as of 12/11/2011  Medication Sig Dispense Refill  . albuterol (PROAIR HFA) 108 (90 BASE) MCG/ACT inhaler Inhale 2 puffs into the lungs every 6 (six) hours as needed for wheezing.  1 Inhaler  3  . cyanocobalamin 2000 MCG tablet Take 2,000 mcg by mouth as needed.      . fluticasone (FLONASE) 50 MCG/ACT nasal spray Place 2 sprays into the nose daily.      Marland Kitchen ibuprofen (ADVIL,MOTRIN) 200 MG tablet Take 600 mg by mouth every 6 (six) hours as needed.      . mometasone (ASMANEX 120 METERED DOSES) 220 MCG/INH inhaler Inhale 2 puffs into the lungs daily.  1 Inhaler  12

## 2011-12-11 NOTE — Patient Instructions (Addendum)
Mucinex DM Twice daily  As needed  Cough/congestion  Fluids and rest  Hydromet 1-2 tsp every 4-6 hr As needed  Cough , may make you sleepy.  Zyrtec 10mg  At bedtime  As needed  Drainage  I will call with CT and lab results.  Please contact office for sooner follow up if symptoms do not improve or worsen or seek emergency care  follow up Dr. Shelle Iron in 3-4 weeks as planned and As needed

## 2011-12-11 NOTE — Telephone Encounter (Signed)
Called, spoke with pt.  Informed him ok to switch from [facility] to [facility].  We have scheduled him for a pulmonary consult on Jan 06, 2012 at 11:45 am in [place] with Dr. Shelle Iron,  He is aware to arrive at 11:30 am.    Pt reports symptoms are unchanged from seeing TP on 11/25/11 - still having SOB, prod cough, some wheezing, and chest tightness when coughing.  Mucus is yellow to clear.  States he did finish the extended avelox given by TP.  Requesting OV today d/t these symptoms.  We have scheduled him to see TP today at 3:15 pm in [place].  Pt aware.

## 2011-12-12 NOTE — Assessment & Plan Note (Signed)
Slow to respond Clinically and incomplete clearance on xray  Plan a CT scan   Plan  Mucinex DM Twice daily  As needed  Cough/congestion  Fluids and rest  Hydromet 1-2 tsp every 4-6 hr As needed  Cough , may make you sleepy.  Zyrtec 10mg  At bedtime  As needed  Drainage  I will call with CT and lab results.  Please contact office for sooner follow up if symptoms do not improve or worsen or seek emergency care  follow up Dr. Shelle Iron in 3-4 weeks as planned and As needed

## 2011-12-12 NOTE — Progress Notes (Signed)
Ov reviewed and agree with plan as outlined.  

## 2011-12-15 ENCOUNTER — Ambulatory Visit (INDEPENDENT_AMBULATORY_CARE_PROVIDER_SITE_OTHER)
Admission: RE | Admit: 2011-12-15 | Discharge: 2011-12-15 | Disposition: A | Discharge status: Home / Self Care | Payer: PRIVATE HEALTH INSURANCE | Source: Ambulatory Visit | Attending: Adult Health | Admitting: Adult Health

## 2011-12-15 ENCOUNTER — Other Ambulatory Visit: Payer: PRIVATE HEALTH INSURANCE

## 2011-12-15 DIAGNOSIS — J189 Pneumonia, unspecified organism: Secondary | ICD-10-CM

## 2011-12-16 ENCOUNTER — Other Ambulatory Visit: Payer: PRIVATE HEALTH INSURANCE

## 2011-12-16 ENCOUNTER — Other Ambulatory Visit (INDEPENDENT_AMBULATORY_CARE_PROVIDER_SITE_OTHER): Payer: PRIVATE HEALTH INSURANCE

## 2011-12-16 ENCOUNTER — Telehealth: Payer: Self-pay | Admitting: *Deleted

## 2011-12-16 ENCOUNTER — Other Ambulatory Visit: Payer: Self-pay | Admitting: Pulmonary Disease

## 2011-12-16 DIAGNOSIS — J849 Interstitial pulmonary disease, unspecified: Secondary | ICD-10-CM

## 2011-12-16 DIAGNOSIS — J8410 Pulmonary fibrosis, unspecified: Secondary | ICD-10-CM

## 2011-12-16 LAB — SEDIMENTATION RATE: Sed Rate: 78 mm/hr — ABNORMAL HIGH (ref 0–22)

## 2011-12-16 NOTE — Telephone Encounter (Signed)
Orders placed for labs. Appt set to see [facility] on 12-23-11 at 1:30pm. Pt aware. Carron Curie, CMA

## 2011-12-16 NOTE — Telephone Encounter (Signed)
Message copied by Darrell Jewel on Tue Dec 16, 2011 11:15 AM ------      Message from: Julio Sicks      Created: Mon Dec 15, 2011  5:56 PM       Set him up to return for the following labs       Set up ov with Dr. Shelle Iron some time next week , talk with Selena Batten             ESR, ANA , ACE , RA factor , pANCA-quantitative, DS-DNA      CCP, AntiSCL70, AntiRNP,       Hypersensitivity panel

## 2011-12-17 LAB — ANA: Anti Nuclear Antibody(ANA): POSITIVE — AB

## 2011-12-18 ENCOUNTER — Ambulatory Visit: Payer: PRIVATE HEALTH INSURANCE | Admitting: Critical Care Medicine

## 2011-12-18 LAB — ANTI-DNA ANTIBODY, DOUBLE-STRANDED: ds DNA Ab: 5 IU/mL (ref <30)

## 2011-12-19 ENCOUNTER — Telehealth: Payer: Self-pay | Admitting: Pulmonary Disease

## 2011-12-19 NOTE — Telephone Encounter (Addendum)
Notes Recorded by Barbaraann Share, MD on 12/18/2011 at 9:18 AM This pt was supposed to get a quantitative P and C anca. Was this done? I see where p anca was cancelled??   It looks like the wrong test was entered then cancelled out. I spoke with Corwin at [facility] and they will ADD the P and C Anca and send the results when ready. I also spoke with Vicky in the [facility] lab and she said that we do not need to enter anything in our system since I have taken care of this through [facility]. I will forward to [facility] so he is aware and we can look for results.

## 2011-12-22 LAB — PAN-ANCA
Atypical p-ANCA Screen: NEGATIVE
Myeloperoxidase Abs: 2 AU/mL (ref <20)
Serine Protease 3: <1 AU/mL (ref <20)
p-ANCA Screen: NEGATIVE

## 2011-12-22 LAB — HYPERSENSITIVITY PNUEMONITIS PROFILE

## 2011-12-23 ENCOUNTER — Ambulatory Visit (INDEPENDENT_AMBULATORY_CARE_PROVIDER_SITE_OTHER): Payer: PRIVATE HEALTH INSURANCE | Admitting: Pulmonary Disease

## 2011-12-23 ENCOUNTER — Encounter: Payer: Self-pay | Admitting: Pulmonary Disease

## 2011-12-23 ENCOUNTER — Institutional Professional Consult (permissible substitution): Payer: PRIVATE HEALTH INSURANCE | Admitting: Pulmonary Disease

## 2011-12-23 VITALS — BP 124/84 | HR 100 | Temp 98.4°F | Ht 74.0 in | Wt 294.2 lb

## 2011-12-23 DIAGNOSIS — R918 Other nonspecific abnormal finding of lung field: Secondary | ICD-10-CM

## 2011-12-23 HISTORY — DX: Other nonspecific abnormal finding of lung field: R91.8

## 2011-12-23 NOTE — Progress Notes (Signed)
Subjective:    Patient ID: Chad [name]., male    DOB: 1951/01/12, 61 y.o.   MRN: [id number]  HPI The patient is a very pleasant 61 year old male who comes in today for a second opinion regarding progressive dyspnea.  He has a history of asthma, as well as obstructive sleep apnea, and began to feel progressive fatigue in June of this year.  In July he started developing dyspnea on exertion which has gotten progressively worse over the last few months.  It has now gotten to the point that he will get winded just walking through his house.  It should be noted that he had saturations of 87% with walking today.  The patient has had a significant cough that is primarily dry, and denies hemoptysis or chest congestion.  He has been treated recently with multiple rounds of antibiotics with no response in his symptoms.  He has had a recent CT chest that shows mildly enlarged lymph nodes, a 6 and 11 mm pulmonary nodule, mild bronchiectasis in the lower lobes, and finely scattered groundglass opacities with associated interstitial changes.  He has had blood work done for possible autoimmune disease and this has been unremarkable except for a sed rate of 78.  He has had a hypersensitivity pneumonitis panel that was unremarkable.  The patient denies any unusual exposures to pets or hobbies, and has a history of minimal asbestos exposure in the past.  He denies any family history of autoimmune disease, and does not have any significant arthritis symptoms.  To his knowledge, he has never been on amiodarone or Macrodantin.  He does have a history of a traumatic bloody effusion in 2007, and then required admission to the hospital in 2009 with progressive pulmonary symptoms associated with mediastinal lymphadenopathy and a pericardial effusion.  He had mediastinoscopy at that time, and it only showed lymphoid hyperplasia.  Followup echo showed resolution of his pericardial effusion.  It should be noted, he was treated with  prednisone for a short time during that particular issue.  No unifying diagnosis has ever been found.   Review of Systems  Constitutional: Positive for fever. Negative for chills, diaphoresis, activity change, appetite change, fatigue and unexpected weight change.  HENT: Positive for postnasal drip. Negative for nosebleeds, congestion, sore throat, sneezing, mouth sores, trouble swallowing, neck stiffness, dental problem, voice change and sinus pressure.   Eyes: Negative for visual disturbance.  Respiratory: Positive for cough, chest tightness, shortness of breath and wheezing. Negative for choking.   Cardiovascular: Negative for chest pain, palpitations and leg swelling.  Gastrointestinal: Negative for nausea, vomiting and constipation.  Genitourinary: Negative for difficulty urinating.  Musculoskeletal: Positive for arthralgias. Negative for joint swelling.  Skin: Negative for rash.  Neurological: Negative for dizziness, syncope, weakness, light-headedness and headaches.  Hematological: Does not bruise/bleed easily.  Psychiatric/Behavioral: Negative for confusion and agitation. The patient is not nervous/anxious.        Objective:   Physical Exam Constitutional:  Obese male, no acute distress  HENT:  Nares patent without discharge  Oropharynx without exudate, palate and uvula are normal  Eyes:  Perrla, eomi, no scleral icterus  Neck:  No JVD, no TMG  Cardiovascular:  Normal rate, regular rhythm, no rubs or gallops.  No murmurs        Intact distal pulses  Pulmonary :  Normal breath sounds, no stridor or respiratory distress   Prominent crackles 1/2 up bilat  Abdominal:  Soft, nondistended, bowel sounds present.  No tenderness noted.  Musculoskeletal:  minimal lower extremity edema noted.  Lymph Nodes:  No cervical lymphadenopathy noted  Skin:  No cyanosis noted  Neurologic:  Alert, appropriate, moves all 4 extremities without obvious deficit.         Assessment &  Plan:

## 2011-12-23 NOTE — Assessment & Plan Note (Signed)
The patient has progressive dyspnea on exertion, and now exertional hypoxemia.  He has not responded to a course of antibiotics, and his chest CT shows ground glass and interstitial infiltrates that are likely to be inflammatory.  I will do an echo for completeness to make sure this is not edema, but unlikely.  I have discussed with the patient the various ways of establishing a diagnosis, and it will require tissue through either bronchoscopy or surgical biopsy.  I think this is very unlikely to be sarcoid or hypersensitivity pneumonitis given his blood work, and the other inflammatory diseases are very difficult to characterize with a small piece of tissue.  I have offered to do the bronchoscopy first, and if we do not get a diagnosis, to proceed with a surgical biopsy.  The patient feels that he would rather have one procedure than two, and I would also favor a surgical biopsy.

## 2011-12-23 NOTE — Patient Instructions (Addendum)
Will start on oxygen with exertional activities only.  Do not require at rest or sleep Will refer to thoracic surgeon to consider lung biopsy.

## 2011-12-30 ENCOUNTER — Encounter: Payer: Self-pay | Admitting: Thoracic Surgery (Cardiothoracic Vascular Surgery)

## 2011-12-30 ENCOUNTER — Encounter (HOSPITAL_COMMUNITY): Payer: Self-pay | Admitting: Pharmacy Technician

## 2011-12-30 ENCOUNTER — Institutional Professional Consult (permissible substitution) (INDEPENDENT_AMBULATORY_CARE_PROVIDER_SITE_OTHER): Payer: PRIVATE HEALTH INSURANCE | Admitting: Thoracic Surgery (Cardiothoracic Vascular Surgery)

## 2011-12-30 ENCOUNTER — Other Ambulatory Visit: Payer: Self-pay

## 2011-12-30 VITALS — BP 134/84 | HR 106 | Temp 99.1°F | Resp 20 | Ht 74.0 in | Wt 294.0 lb

## 2011-12-30 DIAGNOSIS — J849 Interstitial pulmonary disease, unspecified: Secondary | ICD-10-CM

## 2011-12-30 DIAGNOSIS — R918 Other nonspecific abnormal finding of lung field: Secondary | ICD-10-CM

## 2011-12-30 NOTE — Telephone Encounter (Signed)
No

## 2011-12-30 NOTE — Progress Notes (Signed)
PCP is Thomos Lemons, DO Referring Provider is Clance, Maree Krabbe, MD  Chief Complaint  Patient presents with  . Pulmonary Infiltrate    eval and treat...not responsive to antibioticx...having ECHO 12/31/11    HPI: 61 year old male sent for consultation for possible lung biopsy for interstitial lung disease.  [name] is a 61 year old gentleman with a history of asthma since 2009. At that time he had a pleural effusion and pericardial effusion and mediastinal adenopathy. Mediastinoscopy showed lymphoid hyperplasia. He was eventually treated with prednisone and his symptoms improved, but no definitive diagnosis was ever made.  Over the past couple of months he's had progressively worsening shortness of breath. This is primarily with exertion but has progressed to the point that even walking a few steps will make and short of breath. He is using oxygen at home. His been seen by Dr. Shelle Iron and an extensive workup. It has not revealed a underlying cause of his breathing issues. He's had a hypersensitivity workup which was negative. He did have an elevated ANA and ESR. He's not had any unusual exposures, although he's not sure if he may been exposed to mold. A CT scan in September showed significant groundglass opacity throughout both lower lobes and some small lung nodules, the largest being a 10.8 mm nodule in the left upper lobe, his hilar and mediastinal adenopathy changed.  Past Medical History  Diagnosis Date  . Colon polyp   . GERD (gastroesophageal reflux disease)   . Asthma   . Pleural effusion 2009    with neg w/u   . Pericardial effusion 2009    self resolved  . Tendonitis     left shoulder  . Mediastinal lymphadenopathy 2009    Mediastinoscopy : no granuloma or lymphoma, resolved with steroids    Past Surgical History  Procedure Date  . Mediastinoscopy 2009    neg biopsy    Family History  Problem Relation Age of Onset  . Heart attack Father   . Cancer Father     prostate    . Diabetes Daughter     Social History History  Substance Use Topics  . Smoking status: Former Smoker -- 0.2 packs/day for 5 years    Types: Cigarettes, Cigars    Quit date: 04/17/2001  . Smokeless tobacco: Never Used   Comment: 1-2 cigars occasionally x 2-3 yrs  . Alcohol Use: Yes    Current Outpatient Prescriptions  Medication Sig Dispense Refill  . albuterol (PROAIR HFA) 108 (90 BASE) MCG/ACT inhaler Inhale 2 puffs into the lungs every 6 (six) hours as needed for wheezing.  1 Inhaler  3  . cyanocobalamin 2000 MCG tablet Take 2,000 mcg by mouth as needed.      . fluticasone (FLONASE) 50 MCG/ACT nasal spray Place 2 sprays into the nose daily.      Marland Kitchen HYDROcodone-homatropine (HYCODAN) 5-1.5 MG/5ML syrup Take by mouth every 6 (six) hours as needed.      Marland Kitchen ibuprofen (ADVIL,MOTRIN) 200 MG tablet Take 600 mg by mouth every 6 (six) hours as needed.      . mometasone (ASMANEX 120 METERED DOSES) 220 MCG/INH inhaler Inhale 2 puffs into the lungs daily.  1 Inhaler  12    Allergies  Allergen Reactions  . Ivp Dye (Iodinated Diagnostic Agents) Anaphylaxis    Coma for a day in '09    Review of Systems  Constitutional: Positive for activity change and fatigue. Negative for fever, chills and unexpected weight change.  Eyes: Negative for visual  disturbance.       Wears glasses  Respiratory: Positive for apnea (uses CPAP QHS), shortness of breath and wheezing (asthma since 2009).        Uses home O2  Cardiovascular: Negative for chest pain, palpitations and leg swelling.  Gastrointestinal: Negative for abdominal distention.  Neurological: Negative.   Hematological: Does not bruise/bleed easily.  All other systems reviewed and are negative.    BP 134/84  Pulse 106  Temp 99.1 F (37.3 C) (Oral)  Resp 20  Ht 6\' 2"  (1.88 m)  Wt 294 lb (133.358 kg)  BMI 37.75 kg/m2  SpO2 90% Physical Exam  Vitals reviewed. Constitutional: He is oriented to person, place, and time. He appears  well-developed and well-nourished.  HENT:  Head: Normocephalic and atraumatic.  Eyes: EOM are normal. Pupils are equal, round, and reactive to light.  Neck: Neck supple. No thyromegaly present.  Cardiovascular: Normal rate, regular rhythm, normal heart sounds and intact distal pulses.  Exam reveals no gallop and no friction rub.   No murmur heard. Pulmonary/Chest: Effort normal. He has no wheezes. He has rales (faint).       Bronchial BS both bases  Abdominal: Soft. There is no tenderness.  Musculoskeletal: He exhibits no edema.  Lymphadenopathy:    He has no cervical adenopathy.  Neurological: He is alert and oriented to person, place, and time. No cranial nerve deficit.  Skin: Skin is warm and dry.     Diagnostic Tests: CT Chest 12/15/11 *RADIOLOGY REPORT*  Clinical Data: Bibasilar pneumonia. Evaluate for interstitial lung  disease.  CT CHEST WITHOUT CONTRAST  Technique: Multidetector CT imaging of the chest was performed  following the standard protocol without IV contrast.  Comparison: Chest CT 42,009.  Findings:  Mediastinum: There are borderline enlarged and enlarged mediastinal  lymph nodes are again noted, the largest of which is in the  anterior mediastinum measuring 17 mm in short axis. Heart size is  borderline enlarged. There is no significant pericardial fluid,  thickening or pericardial calcification. The esophagus is  unremarkable in appearance.  Lungs/Pleura: Throughout the lungs bilaterally there are areas of  ground-glass attenuation with scattered regions of thickening of  the peribronchovascular interstitium and some subpleural  reticulation. No frank honeycombing is noted. Some mild  cylindrical bronchiectasis is present in the lower lobes of the  lungs bilaterally. There is a subsolid 11 x 9 mm left upper lobe  nodule (image 23 of series 60). A 6 mm subpleural nodule is noted  in the medial aspect of the right upper lobe (image 18 of series  6).  Inspiratory and expiratory imaging is unremarkable.  Upper Abdomen: Unremarkable.  Musculoskeletal: There are no aggressive appearing lytic or blastic  lesions noted in the visualized portions of the skeleton.  IMPRESSION:  1. 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  2.Multiple borderline enlarged and mildly enlarged mediastinal and  hilar lymph nodes appear to be chronic in this patient, and are  similar to prior examination 12/15/2011. This is nonspecific, and  may simply be secondary to underlying interstitial lung disease.  3. Two pulmonary nodules are noted, one 6 mm pulmonary nodule in  the medial aspect of the right upper lobe, and a 9 x 11 mm subsolid  nodule the left upper lobe. Initial follow-up by chest CT without  contrast is recommended in 3 months to confirm persistence. This  recommendation follows the consensus statement: Recommendations for  the Management of Subsolid Pulmonary Nodules Detected at CT: A  Statement from the Fleischner Society as published in Radiology  2013; 266:304-317.  PFTS 11/06/11 FEV1 1.96 (57%) FVC 2.25 (50%) FEV1/FVC 114% MVV 156 L per minute Vital capacity 0.51 L  Impression: 61 year old with progressive dyspnea and new hypoxia requiring home oxygen. Noninvasive workup has not revealed an underlying cause for his lung disease. I recommended that we proceed with a left video-assisted thoracoscopy for lung biopsy and lymph node biopsy.  He understands that this is a diagnostic procedure and not therapeutic in any way. Although we will try to include the left upper lobe nodule in one of the biopsies. I discussed in detail with the patient and his wife the nature of the procedure including the need for general anesthesia, incisions to be used, expected hospital stay, and the overall recovery. I did discuss with them  the indications, risks, benefits, and alternatives. They understand a less invasive biopsy methods such as a needle biopsy or bronchoscopic biopsy are much less likely to result in a diagnosis. They understand the risk of the procedure include but are not limited to death, MI, DVT, PE, bleeding, possible need for transfusion, infection, air leaks, respiratory failure, as well as other unforeseeable complications.  He understands and accepts the risk of the procedure. He wishes to proceed.   Plan: Left VATS, lung biopsy, lymph node biopsy on Monday, October 7.

## 2011-12-30 NOTE — Telephone Encounter (Signed)
Dr. Shelle Iron do  We need to let the pt know about these results? I had kept the msg  to make sure you got the results. Pls advise.

## 2011-12-30 NOTE — Telephone Encounter (Signed)
nope

## 2011-12-31 ENCOUNTER — Ambulatory Visit (HOSPITAL_COMMUNITY): Payer: PRIVATE HEALTH INSURANCE | Attending: Cardiovascular Disease | Admitting: Radiology

## 2011-12-31 DIAGNOSIS — R0609 Other forms of dyspnea: Secondary | ICD-10-CM | POA: Insufficient documentation

## 2011-12-31 DIAGNOSIS — R0989 Other specified symptoms and signs involving the circulatory and respiratory systems: Secondary | ICD-10-CM | POA: Insufficient documentation

## 2011-12-31 DIAGNOSIS — I369 Nonrheumatic tricuspid valve disorder, unspecified: Secondary | ICD-10-CM | POA: Insufficient documentation

## 2011-12-31 DIAGNOSIS — R918 Other nonspecific abnormal finding of lung field: Secondary | ICD-10-CM

## 2011-12-31 HISTORY — PX: TRANSTHORACIC ECHOCARDIOGRAM: SHX275

## 2011-12-31 NOTE — Progress Notes (Signed)
Echocardiogram performed.  

## 2012-01-01 ENCOUNTER — Ambulatory Visit (HOSPITAL_COMMUNITY)
Admission: RE | Admit: 2012-01-01 | Discharge: 2012-01-01 | Disposition: A | Discharge status: Home / Self Care | Payer: PRIVATE HEALTH INSURANCE | Source: Ambulatory Visit | Attending: Thoracic Surgery (Cardiothoracic Vascular Surgery) | Admitting: Thoracic Surgery (Cardiothoracic Vascular Surgery)

## 2012-01-01 ENCOUNTER — Encounter (HOSPITAL_COMMUNITY): Payer: Self-pay

## 2012-01-01 ENCOUNTER — Other Ambulatory Visit: Payer: Self-pay | Admitting: Adult Health

## 2012-01-01 ENCOUNTER — Encounter (HOSPITAL_COMMUNITY)
Admission: RE | Admit: 2012-01-01 | Discharge: 2012-01-01 | Disposition: A | Discharge status: Home / Self Care | Payer: PRIVATE HEALTH INSURANCE | Source: Ambulatory Visit | Attending: Thoracic Surgery (Cardiothoracic Vascular Surgery) | Admitting: Thoracic Surgery (Cardiothoracic Vascular Surgery)

## 2012-01-01 VITALS — BP 119/79 | HR 106 | Temp 98.7°F | Resp 20 | Ht 74.0 in | Wt 288.0 lb

## 2012-01-01 DIAGNOSIS — J849 Interstitial pulmonary disease, unspecified: Secondary | ICD-10-CM

## 2012-01-01 DIAGNOSIS — Z01812 Encounter for preprocedural laboratory examination: Secondary | ICD-10-CM | POA: Insufficient documentation

## 2012-01-01 DIAGNOSIS — R918 Other nonspecific abnormal finding of lung field: Secondary | ICD-10-CM | POA: Insufficient documentation

## 2012-01-01 DIAGNOSIS — Z01818 Encounter for other preprocedural examination: Secondary | ICD-10-CM | POA: Insufficient documentation

## 2012-01-01 DIAGNOSIS — Z01810 Encounter for preprocedural cardiovascular examination: Secondary | ICD-10-CM | POA: Insufficient documentation

## 2012-01-01 HISTORY — DX: Hypothyroidism, unspecified: E03.9

## 2012-01-01 HISTORY — DX: Sleep apnea, unspecified: G47.30

## 2012-01-01 LAB — BLOOD GAS, ARTERIAL
Acid-base deficit: 0.1 mmol/L (ref 0.0–2.0)
Bicarbonate: 23.2 mEq/L (ref 20.0–24.0)
Patient temperature: 98.6
TCO2: 24.2 mmol/L (ref 0–100)
pH, Arterial: 7.466 — ABNORMAL HIGH (ref 7.350–7.450)

## 2012-01-01 LAB — COMPREHENSIVE METABOLIC PANEL
Alkaline Phosphatase: 65 U/L (ref 39–117)
BUN: 11 mg/dL (ref 6–23)
Chloride: 100 mEq/L (ref 96–112)
GFR calc Af Amer: >90 mL/min (ref >90)
Glucose, Bld: 79 mg/dL (ref 70–99)
Potassium: 4.1 mEq/L (ref 3.5–5.1)
Total Bilirubin: 0.2 mg/dL — ABNORMAL LOW (ref 0.3–1.2)

## 2012-01-01 LAB — PROTIME-INR: Prothrombin Time: 14.9 seconds (ref 11.6–15.2)

## 2012-01-01 LAB — URINALYSIS, ROUTINE W REFLEX MICROSCOPIC
Bilirubin Urine: NEGATIVE
Glucose, UA: NEGATIVE mg/dL
Hgb urine dipstick: NEGATIVE
Specific Gravity, Urine: 1.009 (ref 1.005–1.030)
pH: 6 (ref 5.0–8.0)

## 2012-01-01 LAB — CBC
HCT: 40.4 % (ref 39.0–52.0)
Hemoglobin: 13.6 g/dL (ref 13.0–17.0)
MCHC: 33.7 g/dL (ref 30.0–36.0)
MCV: 85.6 fL (ref 78.0–100.0)

## 2012-01-01 LAB — SURGICAL PCR SCREEN: Staphylococcus aureus: NEGATIVE

## 2012-01-01 NOTE — Pre-Procedure Instructions (Signed)
19 Chad [name].  01/01/2012   Your procedure is scheduled on:  Monday, October 7  Report to [facility] at 0830 AM.  Call this number if you have problems the morning of surgery: [phone]   Remember:   Do not eat food or liquids:After Midnight.  Take these medicines the morning of surgery with A SIP OF WATER: inhalers,Flonase, cough medication   Do not wear jewelry, make-up or nail polish.  Do not wear lotions, powders, or perfumes. You may wear deodorant.  Do not shave 48 hours prior to surgery. Men may shave face and neck.  Do not bring valuables to the hospital.  Contacts, dentures or bridgework may not be worn into surgery.  Leave suitcase in the car. After surgery it may be brought to your room.  For patients admitted to the hospital, checkout time is 11:00 AM the day of discharge.   Patients discharged the day of surgery will not be allowed to drive home.  Name and phone number of your driver: n/a  Special Instructions: Incentive Spirometry - Practice and bring it with you on the day of surgery. Shower using CHG 2 nights before surgery and the night before surgery.  If you shower the day of surgery use CHG.  Use special wash - you have one bottle of CHG for all showers.  You should use approximately 1/3 of the bottle for each shower.   Please read over the following fact sheets that you were given: Pain Booklet, Coughing and Deep Breathing, Blood Transfusion Information, MRSA Information and Surgical Site Infection Prevention

## 2012-01-02 ENCOUNTER — Other Ambulatory Visit: Payer: Self-pay | Admitting: Critical Care Medicine

## 2012-01-04 MED ORDER — DEXTROSE 5 % IV SOLN
1.5000 g | INTRAVENOUS | Status: AC
  Administered 2012-01-05: 1.5 g via INTRAVENOUS
  Filled 2012-01-04: qty 1.5

## 2012-01-05 ENCOUNTER — Encounter (HOSPITAL_COMMUNITY): Payer: Self-pay | Admitting: Surgery

## 2012-01-05 ENCOUNTER — Telehealth: Payer: Self-pay | Admitting: Pulmonary Disease

## 2012-01-05 ENCOUNTER — Inpatient Hospital Stay (HOSPITAL_COMMUNITY): Payer: PRIVATE HEALTH INSURANCE

## 2012-01-05 ENCOUNTER — Encounter (HOSPITAL_COMMUNITY): Payer: Self-pay | Admitting: Anesthesiology

## 2012-01-05 ENCOUNTER — Ambulatory Visit (HOSPITAL_COMMUNITY): Payer: PRIVATE HEALTH INSURANCE | Admitting: Anesthesiology

## 2012-01-05 ENCOUNTER — Encounter (HOSPITAL_COMMUNITY)
Admission: RE | Disposition: A | Discharge status: Home / Self Care | Payer: Self-pay | Source: Ambulatory Visit | Attending: Thoracic Surgery (Cardiothoracic Vascular Surgery)

## 2012-01-05 ENCOUNTER — Inpatient Hospital Stay (HOSPITAL_COMMUNITY)
Admission: RE | Admit: 2012-01-05 | Discharge: 2012-01-09 | DRG: 168 | Disposition: A | Discharge status: Home / Self Care | Payer: PRIVATE HEALTH INSURANCE | Source: Ambulatory Visit | Attending: Thoracic Surgery (Cardiothoracic Vascular Surgery) | Admitting: Thoracic Surgery (Cardiothoracic Vascular Surgery)

## 2012-01-05 DIAGNOSIS — Z79899 Other long term (current) drug therapy: Secondary | ICD-10-CM

## 2012-01-05 DIAGNOSIS — R599 Enlarged lymph nodes, unspecified: Secondary | ICD-10-CM | POA: Diagnosis present

## 2012-01-05 DIAGNOSIS — K219 Gastro-esophageal reflux disease without esophagitis: Secondary | ICD-10-CM | POA: Diagnosis present

## 2012-01-05 DIAGNOSIS — K5900 Constipation, unspecified: Secondary | ICD-10-CM | POA: Diagnosis not present

## 2012-01-05 DIAGNOSIS — J8410 Pulmonary fibrosis, unspecified: Secondary | ICD-10-CM

## 2012-01-05 DIAGNOSIS — J849 Interstitial pulmonary disease, unspecified: Secondary | ICD-10-CM

## 2012-01-05 DIAGNOSIS — Z860100 Personal history of colon polyps, unspecified: Secondary | ICD-10-CM

## 2012-01-05 DIAGNOSIS — R911 Solitary pulmonary nodule: Secondary | ICD-10-CM | POA: Diagnosis present

## 2012-01-05 DIAGNOSIS — J45909 Unspecified asthma, uncomplicated: Secondary | ICD-10-CM | POA: Diagnosis present

## 2012-01-05 HISTORY — PX: VIDEO ASSISTED THORACOSCOPY (VATS)/ LYMPH NODE SAMPLING: SHX6170

## 2012-01-05 SURGERY — VIDEO ASSISTED THORACOSCOPY (VATS)/ LYMPH NODE SAMPLING
Anesthesia: General | Site: Chest | Laterality: Left | Wound class: Clean

## 2012-01-05 MED ORDER — DIPHENHYDRAMINE HCL 50 MG/ML IJ SOLN: 12.5000 mg | Freq: Four times a day (QID) | INTRAMUSCULAR | Status: DC | PRN

## 2012-01-05 MED ORDER — HYDROMORPHONE HCL PF 1 MG/ML IJ SOLN
INTRAMUSCULAR | Status: AC
  Filled 2012-01-05: qty 1

## 2012-01-05 MED ORDER — NALOXONE HCL 0.4 MG/ML IJ SOLN: 0.4000 mg | INTRAMUSCULAR | Status: DC | PRN

## 2012-01-05 MED ORDER — KETOROLAC TROMETHAMINE 30 MG/ML IJ SOLN
INTRAMUSCULAR | Status: AC
  Filled 2012-01-05: qty 1

## 2012-01-05 MED ORDER — PROPOFOL 10 MG/ML IV BOLUS
INTRAVENOUS | Status: DC | PRN
  Administered 2012-01-05: 50 mg via INTRAVENOUS
  Administered 2012-01-05: 200 mg via INTRAVENOUS
  Administered 2012-01-05: 100 mg via INTRAVENOUS
  Administered 2012-01-05: 20 mg via INTRAVENOUS

## 2012-01-05 MED ORDER — LACTATED RINGERS IV SOLN
INTRAVENOUS | Status: DC
  Administered 2012-01-05: 12:00:00 via INTRAVENOUS

## 2012-01-05 MED ORDER — BISACODYL 5 MG PO TBEC
10.0000 mg | DELAYED_RELEASE_TABLET | Freq: Every day | ORAL | Status: DC
  Administered 2012-01-06 – 2012-01-09 (×4): 10 mg via ORAL
  Filled 2012-01-05 (×4): qty 2

## 2012-01-05 MED ORDER — ACETAMINOPHEN 10 MG/ML IV SOLN: 1000.0000 mg | Freq: Once | INTRAVENOUS | Status: DC | PRN

## 2012-01-05 MED ORDER — ONDANSETRON HCL 4 MG/2ML IJ SOLN: 4.0000 mg | Freq: Once | INTRAMUSCULAR | Status: DC | PRN

## 2012-01-05 MED ORDER — VECURONIUM BROMIDE 10 MG IV SOLR
INTRAVENOUS | Status: DC | PRN
  Administered 2012-01-05: 1 mg via INTRAVENOUS
  Administered 2012-01-05: 3 mg via INTRAVENOUS
  Administered 2012-01-05 (×2): 1 mg via INTRAVENOUS

## 2012-01-05 MED ORDER — CEFUROXIME SODIUM 1.5 G IJ SOLR
1.5000 g | Freq: Two times a day (BID) | INTRAMUSCULAR | Status: AC
  Administered 2012-01-05 – 2012-01-06 (×2): 1.5 g via INTRAVENOUS
  Filled 2012-01-05 (×2): qty 1.5

## 2012-01-05 MED ORDER — MORPHINE SULFATE (PF) 1 MG/ML IV SOLN
INTRAVENOUS | Status: DC
  Administered 2012-01-05: 22 mg via INTRAVENOUS
  Administered 2012-01-05 (×2): via INTRAVENOUS
  Administered 2012-01-05: 1.5 mg via INTRAVENOUS
  Administered 2012-01-06: 25.5 mg via INTRAVENOUS
  Administered 2012-01-06: 16.47 mg via INTRAVENOUS
  Administered 2012-01-06: 02:00:00 via INTRAVENOUS
  Administered 2012-01-06: 7.5 mg via INTRAVENOUS
  Administered 2012-01-06: 12:00:00 via INTRAVENOUS
  Administered 2012-01-06: 6 mg via INTRAVENOUS
  Administered 2012-01-06: 19 mg via INTRAVENOUS
  Administered 2012-01-06: 15 mg via INTRAVENOUS
  Administered 2012-01-07: 16.5 mg via INTRAVENOUS
  Administered 2012-01-07: 4.5 mg via INTRAVENOUS
  Administered 2012-01-07: 8.11 mg via INTRAVENOUS
  Administered 2012-01-07: 9 mg via INTRAVENOUS
  Administered 2012-01-07: 10.5 mg via INTRAVENOUS
  Administered 2012-01-07: 03:00:00 via INTRAVENOUS
  Administered 2012-01-07: 14.4 mg via INTRAVENOUS
  Administered 2012-01-08: 3 mg via INTRAVENOUS
  Administered 2012-01-08: 10.5 mg via INTRAVENOUS
  Administered 2012-01-08 (×2): 4.5 mg via INTRAVENOUS
  Administered 2012-01-08: 8.82 mg via INTRAVENOUS
  Administered 2012-01-08 – 2012-01-09 (×2): 3 mg via INTRAVENOUS
  Filled 2012-01-05 (×8): qty 25

## 2012-01-05 MED ORDER — OXYCODONE-ACETAMINOPHEN 5-325 MG PO TABS
1.0000 | ORAL_TABLET | ORAL | Status: DC | PRN
  Administered 2012-01-09 (×2): 1 via ORAL
  Filled 2012-01-05 (×2): qty 1

## 2012-01-05 MED ORDER — HEMOSTATIC AGENTS (NO CHARGE) OPTIME
TOPICAL | Status: DC | PRN
  Administered 2012-01-05: 1 via TOPICAL

## 2012-01-05 MED ORDER — FLUTICASONE PROPIONATE 50 MCG/ACT NA SUSP
2.0000 | Freq: Every day | NASAL | Status: DC
  Administered 2012-01-06 – 2012-01-09 (×4): 2 via NASAL
  Filled 2012-01-05: qty 16

## 2012-01-05 MED ORDER — LACTATED RINGERS IV SOLN
INTRAVENOUS | Status: DC | PRN
  Administered 2012-01-05 (×2): via INTRAVENOUS

## 2012-01-05 MED ORDER — SODIUM CHLORIDE 0.9 % IJ SOLN: 9.0000 mL | INTRAMUSCULAR | Status: DC | PRN

## 2012-01-05 MED ORDER — DIPHENHYDRAMINE HCL 12.5 MG/5ML PO ELIX
12.5000 mg | ORAL_SOLUTION | Freq: Four times a day (QID) | ORAL | Status: DC | PRN
  Filled 2012-01-05: qty 5

## 2012-01-05 MED ORDER — FENTANYL CITRATE 0.05 MG/ML IJ SOLN
INTRAMUSCULAR | Status: DC | PRN
  Administered 2012-01-05 (×3): 50 ug via INTRAVENOUS
  Administered 2012-01-05: 100 ug via INTRAVENOUS
  Administered 2012-01-05 (×2): 50 ug via INTRAVENOUS
  Administered 2012-01-05: 25 ug via INTRAVENOUS

## 2012-01-05 MED ORDER — HYDROMORPHONE HCL PF 1 MG/ML IJ SOLN
0.2500 mg | INTRAMUSCULAR | Status: DC | PRN
  Administered 2012-01-05 (×4): 0.5 mg via INTRAVENOUS

## 2012-01-05 MED ORDER — GLYCOPYRROLATE 0.2 MG/ML IJ SOLN
INTRAMUSCULAR | Status: DC | PRN
  Administered 2012-01-05: .8 mg via INTRAVENOUS

## 2012-01-05 MED ORDER — ONDANSETRON HCL 4 MG/2ML IJ SOLN
INTRAMUSCULAR | Status: DC | PRN
  Administered 2012-01-05: 4 mg via INTRAVENOUS

## 2012-01-05 MED ORDER — LABETALOL HCL 5 MG/ML IV SOLN
INTRAVENOUS | Status: DC | PRN
  Administered 2012-01-05 (×5): 5 mg via INTRAVENOUS

## 2012-01-05 MED ORDER — ONDANSETRON HCL 4 MG/2ML IJ SOLN: 4.0000 mg | Freq: Four times a day (QID) | INTRAMUSCULAR | Status: DC | PRN

## 2012-01-05 MED ORDER — SENNOSIDES-DOCUSATE SODIUM 8.6-50 MG PO TABS
1.0000 | ORAL_TABLET | Freq: Every evening | ORAL | Status: DC | PRN
  Administered 2012-01-08: 1 via ORAL
  Filled 2012-01-05 (×2): qty 1

## 2012-01-05 MED ORDER — OXYCODONE HCL 5 MG PO TABS: 5.0000 mg | ORAL_TABLET | ORAL | Status: AC | PRN

## 2012-01-05 MED ORDER — POTASSIUM CHLORIDE 10 MEQ/50ML IV SOLN: 10.0000 meq | Freq: Every day | INTRAVENOUS | Status: DC | PRN

## 2012-01-05 MED ORDER — 0.9 % SODIUM CHLORIDE (POUR BTL) OPTIME
TOPICAL | Status: DC | PRN
  Administered 2012-01-05: 3000 mL

## 2012-01-05 MED ORDER — MIDAZOLAM HCL 5 MG/5ML IJ SOLN
INTRAMUSCULAR | Status: DC | PRN
  Administered 2012-01-05: 2 mg via INTRAVENOUS

## 2012-01-05 MED ORDER — TRAMADOL HCL 50 MG PO TABS: 50.0000 mg | ORAL_TABLET | Freq: Four times a day (QID) | ORAL | Status: DC | PRN

## 2012-01-05 MED ORDER — ACETAMINOPHEN 10 MG/ML IV SOLN
1000.0000 mg | Freq: Four times a day (QID) | INTRAVENOUS | Status: AC
  Administered 2012-01-05 – 2012-01-06 (×4): 1000 mg via INTRAVENOUS
  Filled 2012-01-05 (×5): qty 100

## 2012-01-05 MED ORDER — ROCURONIUM BROMIDE 100 MG/10ML IV SOLN
INTRAVENOUS | Status: DC | PRN
  Administered 2012-01-05: 50 mg via INTRAVENOUS

## 2012-01-05 MED ORDER — LIDOCAINE HCL (CARDIAC) 20 MG/ML IV SOLN
INTRAVENOUS | Status: DC | PRN
  Administered 2012-01-05: 50 mg via INTRAVENOUS

## 2012-01-05 MED ORDER — KETOROLAC TROMETHAMINE 30 MG/ML IJ SOLN
30.0000 mg | Freq: Four times a day (QID) | INTRAMUSCULAR | Status: AC | PRN
  Administered 2012-01-05: 30 mg via INTRAVENOUS

## 2012-01-05 MED ORDER — KCL IN DEXTROSE-NACL 20-5-0.9 MEQ/L-%-% IV SOLN
INTRAVENOUS | Status: DC
  Administered 2012-01-05 – 2012-01-06 (×3): via INTRAVENOUS
  Administered 2012-01-07 – 2012-01-08 (×2): 20 mL/h via INTRAVENOUS
  Filled 2012-01-05 (×10): qty 1000

## 2012-01-05 MED ORDER — MORPHINE SULFATE (PF) 1 MG/ML IV SOLN
INTRAVENOUS | Status: AC
  Filled 2012-01-05: qty 25

## 2012-01-05 MED ORDER — NEOSTIGMINE METHYLSULFATE 1 MG/ML IJ SOLN
INTRAMUSCULAR | Status: DC | PRN
  Administered 2012-01-05: 5 mg via INTRAVENOUS

## 2012-01-05 MED ORDER — FLUTICASONE PROPIONATE HFA 44 MCG/ACT IN AERO
2.0000 | INHALATION_SPRAY | Freq: Two times a day (BID) | RESPIRATORY_TRACT | Status: DC
  Administered 2012-01-05 – 2012-01-09 (×8): 2 via RESPIRATORY_TRACT
  Filled 2012-01-05: qty 10.6

## 2012-01-05 SURGICAL SUPPLY — 75 items
APL SRG 22X2 LUM MLBL SLNT (VASCULAR PRODUCTS)
APPLICATOR TIP EXT COSEAL (VASCULAR PRODUCTS) IMPLANT
BAG SPEC RTRVL LRG 6X4 10 (ENDOMECHANICALS)
CANISTER SUCTION 2500CC (MISCELLANEOUS) ×2 IMPLANT
CATH KIT ON Q 5IN SLV (PAIN MANAGEMENT) IMPLANT
CATH THORACIC 28FR (CATHETERS) ×1 IMPLANT
CATH THORACIC 28FR RT ANG (CATHETERS) IMPLANT
CATH THORACIC 36FR (CATHETERS) IMPLANT
CATH THORACIC 36FR RT ANG (CATHETERS) IMPLANT
CLIP TI MEDIUM 6 (CLIP) ×2 IMPLANT
CLOTH BEACON ORANGE TIMEOUT ST (SAFETY) ×2 IMPLANT
CONN ST 1/4X3/8  BEN (MISCELLANEOUS) ×1
CONN ST 1/4X3/8 BEN (MISCELLANEOUS) IMPLANT
CONN Y 3/8X3/8X3/8  BEN (MISCELLANEOUS) ×1
CONN Y 3/8X3/8X3/8 BEN (MISCELLANEOUS) IMPLANT
CONT SPEC 4OZ CLIKSEAL STRL BL (MISCELLANEOUS) ×6 IMPLANT
COVER SURGICAL LIGHT HANDLE (MISCELLANEOUS) ×4 IMPLANT
DRAIN CHANNEL 32F RND 10.7 FF (WOUND CARE) ×1 IMPLANT
DRAPE LAPAROSCOPIC ABDOMINAL (DRAPES) ×2 IMPLANT
DRAPE WARM FLUID 44X44 (DRAPE) ×2 IMPLANT
ELECT REM PT RETURN 9FT ADLT (ELECTROSURGICAL) ×2
ELECTRODE REM PT RTRN 9FT ADLT (ELECTROSURGICAL) ×1 IMPLANT
GLOVE BIO SURGEON STRL SZ 6.5 (GLOVE) ×1 IMPLANT
GLOVE BIOGEL PI IND STRL 6.5 (GLOVE) IMPLANT
GLOVE BIOGEL PI IND STRL 7.0 (GLOVE) IMPLANT
GLOVE BIOGEL PI INDICATOR 6.5 (GLOVE) ×2
GLOVE BIOGEL PI INDICATOR 7.0 (GLOVE) ×2
GLOVE EUDERMIC 7 POWDERFREE (GLOVE) ×4 IMPLANT
GOWN PREVENTION PLUS XLARGE (GOWN DISPOSABLE) ×2 IMPLANT
GOWN STRL NON-REIN LRG LVL3 (GOWN DISPOSABLE) ×5 IMPLANT
HANDLE STAPLE ENDO GIA SHORT (STAPLE) ×1
HEMOSTAT SURGICEL 2X14 (HEMOSTASIS) ×1 IMPLANT
KIT BASIN OR (CUSTOM PROCEDURE TRAY) ×2 IMPLANT
KIT ROOM TURNOVER OR (KITS) ×2 IMPLANT
NS IRRIG 1000ML POUR BTL (IV SOLUTION) ×5 IMPLANT
PACK CHEST (CUSTOM PROCEDURE TRAY) ×2 IMPLANT
PAD ARMBOARD 7.5X6 YLW CONV (MISCELLANEOUS) ×4 IMPLANT
POUCH ENDO CATCH II 15MM (MISCELLANEOUS) IMPLANT
POUCH SPECIMEN RETRIEVAL 10MM (ENDOMECHANICALS) IMPLANT
RELOAD EGIA 45 MED/THCK PURPLE (STAPLE) ×3 IMPLANT
RELOAD EGIA 45 TAN VASC (STAPLE) ×2 IMPLANT
RELOAD EGIA 60 MED/THCK PURPLE (STAPLE) ×2 IMPLANT
RELOAD STAPLE 60 MED/THCK ART (STAPLE) IMPLANT
SEALANT PROGEL (MISCELLANEOUS) IMPLANT
SEALANT SURG COSEAL 4ML (VASCULAR PRODUCTS) IMPLANT
SEALANT SURG COSEAL 8ML (VASCULAR PRODUCTS) IMPLANT
SOLUTION ANTI FOG 6CC (MISCELLANEOUS) ×3 IMPLANT
SPONGE GAUZE 4X4 12PLY (GAUZE/BANDAGES/DRESSINGS) ×2 IMPLANT
SPONGE INTESTINAL PEANUT (DISPOSABLE) ×3 IMPLANT
SPONGE LAP 18X18 X RAY DECT (DISPOSABLE) ×1 IMPLANT
STAPLER ENDO GIA 12 SHRT THIN (STAPLE) IMPLANT
STAPLER ENDO GIA 12MM SHORT (STAPLE) ×1 IMPLANT
SUT PROLENE 4 0 RB 1 (SUTURE)
SUT PROLENE 4-0 RB1 .5 CRCL 36 (SUTURE) IMPLANT
SUT SILK  1 MH (SUTURE) ×2
SUT SILK 1 MH (SUTURE) ×2 IMPLANT
SUT SILK 2 0SH CR/8 30 (SUTURE) ×1 IMPLANT
SUT SILK 3 0SH CR/8 30 (SUTURE) IMPLANT
SUT VIC AB 1 CTX 36 (SUTURE) ×2
SUT VIC AB 1 CTX36XBRD ANBCTR (SUTURE) IMPLANT
SUT VIC AB 2-0 CT1 27 (SUTURE) ×6
SUT VIC AB 2-0 CT1 TAPERPNT 27 (SUTURE) IMPLANT
SUT VIC AB 2-0 CTX 36 (SUTURE) IMPLANT
SUT VIC AB 2-0 UR6 27 (SUTURE) ×1 IMPLANT
SUT VIC AB 3-0 MH 27 (SUTURE) IMPLANT
SUT VIC AB 3-0 X1 27 (SUTURE) ×2 IMPLANT
SUT VICRYL 2 TP 1 (SUTURE) IMPLANT
SYSTEM SAHARA CHEST DRAIN RE-I (WOUND CARE) ×2 IMPLANT
TAPE CLOTH 4X10 WHT NS (GAUZE/BANDAGES/DRESSINGS) ×2 IMPLANT
TAPE CLOTH SURG 6X10 WHT LF (GAUZE/BANDAGES/DRESSINGS) ×1 IMPLANT
TIP APPLICATOR SPRAY EXTEND 16 (VASCULAR PRODUCTS) IMPLANT
TOWEL OR 17X24 6PK STRL BLUE (TOWEL DISPOSABLE) ×4 IMPLANT
TOWEL OR 17X26 10 PK STRL BLUE (TOWEL DISPOSABLE) ×4 IMPLANT
TRAY FOLEY CATH 14FRSI W/METER (CATHETERS) ×2 IMPLANT
WATER STERILE IRR 1000ML POUR (IV SOLUTION) ×4 IMPLANT

## 2012-01-05 NOTE — Interval H&P Note (Signed)
History and Physical Interval Note:  01/05/2012 11:22 AM  Chad [name].  has presented today for surgery, with the diagnosis of INTERSTITIAL LUNG DISEASE, MEDIASTINAL ADENOPATHY  The various methods of treatment have been discussed with the patient and family. After consideration of risks, benefits and other options for treatment, the patient has consented to  Procedure(s) (LRB) with comments: VIDEO ASSISTED THORACOSCOPY (VATS)/ LYMPH NODE SAMPLING (Left) - (L)VATS, LUNG BIOPSY, LYMPH NODE BIOPSY as a surgical intervention .  The patient's history has been reviewed, patient examined, no change in status, stable for surgery.  I have reviewed the patient's chart and labs.  Questions were answered to the patient's satisfaction.     [name] C

## 2012-01-05 NOTE — Anesthesia Preprocedure Evaluation (Addendum)
Anesthesia Evaluation  Patient identified by MRN, date of birth, ID band Patient awake    Reviewed: Allergy & Precautions, H&P , NPO status , Patient's Chart, lab work & pertinent test results  Airway Mallampati: II TM Distance: >3 FB Neck ROM: Full    Dental  (+) Teeth Intact, Dental Advisory Given and Caps   Pulmonary shortness of breath and with exertion, asthma , sleep apnea and Continuous Positive Airway Pressure Ventilation ,  + rhonchi   + decreased breath sounds      Cardiovascular Rhythm:Regular Rate:Tachycardia     Neuro/Psych  Neuromuscular disease    GI/Hepatic GERD-  Controlled,  Endo/Other  Hypothyroidism   Renal/GU      Musculoskeletal   Abdominal (+)  Abdomen: soft.    Peds  Hematology   Anesthesia Other Findings   Reproductive/Obstetrics                          Anesthesia Physical Anesthesia Plan  ASA: III  Anesthesia Plan: General   Post-op Pain Management:    Induction: Intravenous  Airway Management Planned: Double Lumen EBT  Additional Equipment: Arterial line, CVP and Ultrasound Guidance Line Placement  Intra-op Plan:   Post-operative Plan: Extubation in OR and Possible Post-op intubation/ventilation  Informed Consent: I have reviewed the patients History and Physical, chart, labs and discussed the procedure including the risks, benefits and alternatives for the proposed anesthesia with the patient or authorized representative who has indicated his/her understanding and acceptance.   Dental advisory given  Plan Discussed with: CRNA and Anesthesiologist  Anesthesia Plan Comments:         Anesthesia Quick Evaluation

## 2012-01-05 NOTE — Brief Op Note (Addendum)
01/05/2012  2:57 PM  PATIENT:  Chad [name].  61 y.o. male  PRE-OPERATIVE DIAGNOSIS: 1.Multiple ground glass opacities 2. MEDIASTINAL ADENOPATHY  POST-OPERATIVE DIAGNOSIS: 1.Multiple ground glass opacities 2. MEDIASTINAL ADENOPATHY  PROCEDURE:  LEFT VIDEO ASSISTED THORACOSCOPY (VATS), LEFT mini thoracotomy, left lingula biopsy, LLL lung biopsies x 2, and lymph node dissection  SURGEON:  Surgeon(s) and Role:    * Loreli Slot, MD - Primary  PHYSICIAN ASSISTANT: Doree Fudge PA-C   ANESTHESIA:   general  EBL:  Total I/O In: 1000 [I.V.:1000] Out: 90 [Urine:90]  BLOOD ADMINISTERED:none  DRAINS: One 74 French Chest Tube(s) in the left pleural space and (one) Blake drain(s) in the left pleural space   SPECIMEN:  Source of Specimen:  Left lingula, LLL biopsies, and lymph nodes.Frozen section of left lingula biopsy showed probable interstitial lung disease  DISPOSITION OF SPECIMEN:  PATHOLOGY  COUNTS CORRECT:  YES  DICTATION: .Dragon Dictation  PLAN OF CARE: Admit to inpatient   PATIENT DISPOSITION:  PACU - hemodynamically stable.   Delay start of Pharmacological VTE agent (>24hrs) due to surgical blood loss or risk of bleeding: yes   Adhesions and body habitus limited exposure. Unable to remove LUL nodule. Frozen of lingua biopsy showed probable interstitial lung disease

## 2012-01-05 NOTE — Progress Notes (Signed)
[facility] BRIEF SICU PROGRESS NOTE  Day of Surgery  S/P Procedure(s) (LRB): VIDEO ASSISTED THORACOSCOPY (VATS)/ LYMPH NODE SAMPLING (Left)   Awake and alert Pain fairly well controlled Breathing comfortably Chest tube output low, no air leak  Plan: Continue routine early postop  OWEN,CLARENCE H 01/05/2012 6:14 PM

## 2012-01-05 NOTE — Progress Notes (Signed)
Dr. Dorris Fetch notified that CXR done Thursday-he stated this was acceptable.  CXR discontinued for today.//L. Erric Machnik,RN

## 2012-01-05 NOTE — Anesthesia Procedure Notes (Signed)
Procedure Name: Intubation Date/Time: 01/05/2012 12:39 PM Performed by: Gayla Medicus Pre-anesthesia Checklist: Timeout performed, Patient identified, Emergency Drugs available, Suction available and Patient being monitored Patient Re-evaluated:Patient Re-evaluated prior to inductionOxygen Delivery Method: Circle system utilized Preoxygenation: Pre-oxygenation with 100% oxygen Intubation Type: IV induction Ventilation: Two handed mask ventilation required and Oral airway inserted - appropriate to patient size Laryngoscope Size: Mac and 3 Grade View: Grade IV Tube type: Oral Endobronchial tube: Left and EBT position confirmed by fiberoptic bronchoscope and 39 Fr Number of attempts: 2 Placement Confirmation: positive ETCO2 and breath sounds checked- equal and bilateral Tube secured with: Tape Dental Injury: Teeth and Oropharynx as per pre-operative assessment  Difficulty Due To: Difficulty was anticipated, Difficult Airway- due to limited oral opening and Difficult Airway- due to dentition

## 2012-01-05 NOTE — H&P (View-Only) (Signed)
PCP is Robert Yoo, DO Referring Provider is Clance, Keith M, MD  Chief Complaint  Patient presents with  . Pulmonary Infiltrate    eval and treat...not responsive to antibioticx...having ECHO 12/31/11    HPI: 61-year-old male sent for consultation for possible lung biopsy for interstitial lung disease.  Chad [name] is a 61-year-old gentleman with a history of asthma since 2009. At that time he had a pleural effusion and pericardial effusion and mediastinal adenopathy. Mediastinoscopy showed lymphoid hyperplasia. He was eventually treated with prednisone and his symptoms improved, but no definitive diagnosis was ever made.  Over the past couple of months he's had progressively worsening shortness of breath. This is primarily with exertion but has progressed to the point that even walking a few steps will make and short of breath. He is using oxygen at home. His been seen by Dr. Clance and an extensive workup. It has not revealed a underlying cause of his breathing issues. He's had a hypersensitivity workup which was negative. He did have an elevated ANA and ESR. He's not had any unusual exposures, although he's not sure if he may been exposed to mold. A CT scan in September showed significant groundglass opacity throughout both lower lobes and some small lung nodules, the largest being a 10.8 mm nodule in the left upper lobe, his hilar and mediastinal adenopathy changed.  Past Medical History  Diagnosis Date  . Colon polyp   . GERD (gastroesophageal reflux disease)   . Asthma   . Pleural effusion 2009    with neg w/u   . Pericardial effusion 2009    self resolved  . Tendonitis     left shoulder  . Mediastinal lymphadenopathy 2009    Mediastinoscopy : no granuloma or lymphoma, resolved with steroids    Past Surgical History  Procedure Date  . Mediastinoscopy 2009    neg biopsy    Family History  Problem Relation Age of Onset  . Heart attack Father   . Cancer Father     prostate    . Diabetes Daughter     Social History History  Substance Use Topics  . Smoking status: Former Smoker -- 0.2 packs/day for 5 years    Types: Cigarettes, Cigars    Quit date: 04/17/2001  . Smokeless tobacco: Never Used   Comment: 1-2 cigars occasionally x 2-3 yrs  . Alcohol Use: Yes    Current Outpatient Prescriptions  Medication Sig Dispense Refill  . albuterol (PROAIR HFA) 108 (90 BASE) MCG/ACT inhaler Inhale 2 puffs into the lungs every 6 (six) hours as needed for wheezing.  1 Inhaler  3  . cyanocobalamin 2000 MCG tablet Take 2,000 mcg by mouth as needed.      . fluticasone (FLONASE) 50 MCG/ACT nasal spray Place 2 sprays into the nose daily.      . HYDROcodone-homatropine (HYCODAN) 5-1.5 MG/5ML syrup Take by mouth every 6 (six) hours as needed.      . ibuprofen (ADVIL,MOTRIN) 200 MG tablet Take 600 mg by mouth every 6 (six) hours as needed.      . mometasone (ASMANEX 120 METERED DOSES) 220 MCG/INH inhaler Inhale 2 puffs into the lungs daily.  1 Inhaler  12    Allergies  Allergen Reactions  . Ivp Dye (Iodinated Diagnostic Agents) Anaphylaxis    Coma for a day in '09    Review of Systems  Constitutional: Positive for activity change and fatigue. Negative for fever, chills and unexpected weight change.  Eyes: Negative for visual   disturbance.       Wears glasses  Respiratory: Positive for apnea (uses CPAP QHS), shortness of breath and wheezing (asthma since 2009).        Uses home O2  Cardiovascular: Negative for chest pain, palpitations and leg swelling.  Gastrointestinal: Negative for abdominal distention.  Neurological: Negative.   Hematological: Does not bruise/bleed easily.  All other systems reviewed and are negative.    BP 134/84  Pulse 106  Temp 99.1 F (37.3 C) (Oral)  Resp 20  Ht 6' 2" (1.88 m)  Wt 294 lb (133.358 kg)  BMI 37.75 kg/m2  SpO2 90% Physical Exam  Vitals reviewed. Constitutional: He is oriented to person, place, and time. He appears  well-developed and well-nourished.  HENT:  Head: Normocephalic and atraumatic.  Eyes: EOM are normal. Pupils are equal, round, and reactive to light.  Neck: Neck supple. No thyromegaly present.  Cardiovascular: Normal rate, regular rhythm, normal heart sounds and intact distal pulses.  Exam reveals no gallop and no friction rub.   No murmur heard. Pulmonary/Chest: Effort normal. He has no wheezes. He has rales (faint).       Bronchial BS both bases  Abdominal: Soft. There is no tenderness.  Musculoskeletal: He exhibits no edema.  Lymphadenopathy:    He has no cervical adenopathy.  Neurological: He is alert and oriented to person, place, and time. No cranial nerve deficit.  Skin: Skin is warm and dry.     Diagnostic Tests: CT Chest 12/15/11 *RADIOLOGY REPORT*  Clinical Data: Bibasilar pneumonia. Evaluate for interstitial lung  disease.  CT CHEST WITHOUT CONTRAST  Technique: Multidetector CT imaging of the chest was performed  following the standard protocol without IV contrast.  Comparison: Chest CT 42,009.  Findings:  Mediastinum: There are borderline enlarged and enlarged mediastinal  lymph nodes are again noted, the largest of which is in the  anterior mediastinum measuring 17 mm in short axis. Heart size is  borderline enlarged. There is no significant pericardial fluid,  thickening or pericardial calcification. The esophagus is  unremarkable in appearance.  Lungs/Pleura: Throughout the lungs bilaterally there are areas of  ground-glass attenuation with scattered regions of thickening of  the peribronchovascular interstitium and some subpleural  reticulation. No frank honeycombing is noted. Some mild  cylindrical bronchiectasis is present in the lower lobes of the  lungs bilaterally. There is a subsolid 11 x 9 mm left upper lobe  nodule (image 23 of series 60). A 6 mm subpleural nodule is noted  in the medial aspect of the right upper lobe (image 18 of series  6).  Inspiratory and expiratory imaging is unremarkable.  Upper Abdomen: Unremarkable.  Musculoskeletal: There are no aggressive appearing lytic or blastic  lesions noted in the visualized portions of the skeleton.  IMPRESSION:  1. 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  2.Multiple borderline enlarged and mildly enlarged mediastinal and  hilar lymph nodes appear to be chronic in this patient, and are  similar to prior examination 12/15/2011. This is nonspecific, and  may simply be secondary to underlying interstitial lung disease.  3. Two pulmonary nodules are noted, one 6 mm pulmonary nodule in  the medial aspect of the right upper lobe, and a 9 x 11 mm subsolid  nodule the left upper lobe. Initial follow-up by chest CT without  contrast is recommended in 3 months to confirm persistence. This    recommendation follows the consensus statement: Recommendations for  the Management of Subsolid Pulmonary Nodules Detected at CT: A  Statement from the Fleischner Society as published in Radiology  2013; 266:304-317.  PFTS 11/06/11 FEV1 1.96 (57%) FVC 2.25 (50%) FEV1/FVC 114% MVV 156 L per minute Vital capacity 0.51 L  Impression: 61-year-old with progressive dyspnea and new hypoxia requiring home oxygen. Noninvasive workup has not revealed an underlying cause for his lung disease. I recommended that we proceed with a left video-assisted thoracoscopy for lung biopsy and lymph node biopsy.  He understands that this is a diagnostic procedure and not therapeutic in any way. Although we will try to include the left upper lobe nodule in one of the biopsies. I discussed in detail with the patient and his wife the nature of the procedure including the need for general anesthesia, incisions to be used, expected hospital stay, and the overall recovery. I did discuss with them  the indications, risks, benefits, and alternatives. They understand a less invasive biopsy methods such as a needle biopsy or bronchoscopic biopsy are much less likely to result in a diagnosis. They understand the risk of the procedure include but are not limited to death, MI, DVT, PE, bleeding, possible need for transfusion, infection, air leaks, respiratory failure, as well as other unforeseeable complications.  He understands and accepts the risk of the procedure. He wishes to proceed.   Plan: Left VATS, lung biopsy, lymph node biopsy on Monday, October 7. 

## 2012-01-05 NOTE — Preoperative (Signed)
Beta Blockers   Reason not to administer Beta Blockers:Not Applicable 

## 2012-01-05 NOTE — Transfer of Care (Signed)
Immediate Anesthesia Transfer of Care Note  Patient: Chad [name].  Procedure(s) Performed: Procedure(s) (LRB) with comments: VIDEO ASSISTED THORACOSCOPY (VATS)/ LYMPH NODE SAMPLING (Left) - (L)VATS, LUNG BIOPSY, LYMPH NODE BIOPSY  Patient Location: PACU  Anesthesia Type: General  Level of Consciousness: awake  Airway & Oxygen Therapy: Patient Spontanous Breathing and Patient connected to face mask oxygen  Post-op Assessment: Report given to PACU RN, Post -op Vital signs reviewed and stable and Patient moving all extremities  Post vital signs: Reviewed and stable  Complications: No apparent anesthesia complications

## 2012-01-05 NOTE — Telephone Encounter (Signed)
LMOMTCB x 1 

## 2012-01-05 NOTE — Anesthesia Postprocedure Evaluation (Signed)
  Anesthesia Post-op Note  Patient: Chad [name].  Procedure(s) Performed: Procedure(s) (LRB) with comments: VIDEO ASSISTED THORACOSCOPY (VATS)/ LYMPH NODE SAMPLING (Left) - (L)VATS, LUNG BIOPSY, LYMPH NODE BIOPSY  Patient Location: PACU  Anesthesia Type: General  Level of Consciousness: awake, alert  and oriented  Airway and Oxygen Therapy: Patient Spontanous Breathing and Patient connected to face mask oxygen  Post-op Pain: moderate  Post-op Assessment: Post-op Vital signs reviewed and Patient's Cardiovascular Status Stable  Post-op Vital Signs: stable  Complications: No apparent anesthesia complications

## 2012-01-06 ENCOUNTER — Inpatient Hospital Stay (HOSPITAL_COMMUNITY): Payer: PRIVATE HEALTH INSURANCE

## 2012-01-06 ENCOUNTER — Institutional Professional Consult (permissible substitution): Payer: PRIVATE HEALTH INSURANCE | Admitting: Pulmonary Disease

## 2012-01-06 LAB — POCT I-STAT 3, ART BLOOD GAS (G3+)
O2 Saturation: 94 %
Patient temperature: 98
pCO2 arterial: 55.9 mmHg — ABNORMAL HIGH (ref 35.0–45.0)
pH, Arterial: 7.321 — ABNORMAL LOW (ref 7.350–7.450)

## 2012-01-06 LAB — BASIC METABOLIC PANEL
CO2: 25 mEq/L (ref 19–32)
Calcium: 8.8 mg/dL (ref 8.4–10.5)
Creatinine, Ser: 0.83 mg/dL (ref 0.50–1.35)
GFR calc non Af Amer: >90 mL/min (ref >90)
Glucose, Bld: 118 mg/dL — ABNORMAL HIGH (ref 70–99)

## 2012-01-06 LAB — CBC
Hemoglobin: 11.6 g/dL — ABNORMAL LOW (ref 13.0–17.0)
MCH: 28.2 pg (ref 26.0–34.0)
MCHC: 32.7 g/dL (ref 30.0–36.0)
MCV: 86.2 fL (ref 78.0–100.0)
RBC: 4.12 MIL/uL — ABNORMAL LOW (ref 4.22–5.81)

## 2012-01-06 MED ORDER — SODIUM CHLORIDE 0.9 % IJ SOLN
10.0000 mL | INTRAMUSCULAR | Status: DC | PRN
  Filled 2012-01-06: qty 20
  Filled 2012-01-06: qty 10

## 2012-01-06 MED ORDER — HYDROCODONE-HOMATROPINE 5-1.5 MG/5ML PO SYRP: 5.0000 mL | ORAL_SOLUTION | Freq: Four times a day (QID) | ORAL | Status: DC | PRN

## 2012-01-06 MED ORDER — GUAIFENESIN ER 600 MG PO TB12
1200.0000 mg | ORAL_TABLET | Freq: Two times a day (BID) | ORAL | Status: DC
  Administered 2012-01-06 – 2012-01-09 (×7): 1200 mg via ORAL
  Filled 2012-01-06 (×2): qty 2
  Filled 2012-01-06: qty 1
  Filled 2012-01-06 (×3): qty 2
  Filled 2012-01-06: qty 1
  Filled 2012-01-06 (×3): qty 2

## 2012-01-06 MED ORDER — SODIUM CHLORIDE 0.9 % IJ SOLN
10.0000 mL | Freq: Two times a day (BID) | INTRAMUSCULAR | Status: DC
  Administered 2012-01-08 – 2012-01-09 (×3): 10 mL
  Filled 2012-01-06: qty 10

## 2012-01-06 MED ORDER — ALBUTEROL SULFATE HFA 108 (90 BASE) MCG/ACT IN AERS
2.0000 | INHALATION_SPRAY | RESPIRATORY_TRACT | Status: DC | PRN
  Filled 2012-01-06: qty 6.7

## 2012-01-06 MED ORDER — ENOXAPARIN SODIUM 40 MG/0.4ML ~~LOC~~ SOLN
40.0000 mg | SUBCUTANEOUS | Status: DC
  Administered 2012-01-06 – 2012-01-09 (×4): 40 mg via SUBCUTANEOUS
  Filled 2012-01-06 (×5): qty 0.4

## 2012-01-06 NOTE — Progress Notes (Signed)
1 Day Post-Op Procedure(s) (LRB): VIDEO ASSISTED THORACOSCOPY (VATS)/ LYMPH NODE SAMPLING (Left) Subjective: Some incisional discomfort Breathing "is OK" Denies nausea  Objective: Vital signs in last 24 hours: Temp:  [97.2 F (36.2 C)-100.1 F (37.8 C)] 100.1 F (37.8 C) (10/08 0730) Pulse Rate:  [81-117] 99  (10/08 0800) Cardiac Rhythm:  [-] Normal sinus rhythm;Sinus tachycardia (10/08 0800) Resp:  [9-34] 25  (10/08 0800) BP: (92-143)/(58-93) 101/61 mmHg (10/08 0800) SpO2:  [87 %-99 %] 98 % (10/08 0800) Arterial Line BP: (86-150)/(52-84) 89/55 mmHg (10/08 0800) Weight:  [284 lb 6.3 oz (129 kg)] 284 lb 6.3 oz (129 kg) (10/08 0600)  Hemodynamic parameters for last 24 hours:    Intake/Output from previous day: 10/07 0701 - 10/08 0700 In: 2876 [P.O.:120; I.V.:2406; IV Piggyback:350] Out: 1050 [Urine:615; Chest Tube:435] Intake/Output this shift: Total I/O In: 116.5 [I.V.:116.5] Out: 20 [Urine:20]  General appearance: alert and no distress Neurologic: intact Heart: mildly tachycardic Lungs: diminished breath sounds bibasilar and wheezes bilaterally Abdomen: normal findings: soft, non-tender  Lab Results:  Basename 01/06/12 0350  WBC 12.2*  HGB 11.6*  HCT 35.5*  PLT 229   BMET:  Basename 01/06/12 0350  NA 138  K 4.7  CL 105  CO2 25  GLUCOSE 118*  BUN 15  CREATININE 0.83  CALCIUM 8.8    PT/INR: No results found for this basename: LABPROT,INR in the last 72 hours ABG    Component Value Date/Time   PHART 7.321* 01/06/2012 0400   HCO3 29.0* 01/06/2012 0400   TCO2 31 01/06/2012 0400   ACIDBASEDEF 0.1 01/01/2012 1501   O2SAT 94.0 01/06/2012 0400   CBG (last 3)  No results found for this basename: GLUCAP:3 in the last 72 hours  Assessment/Plan: S/P Procedure(s) (LRB): VIDEO ASSISTED THORACOSCOPY (VATS)/ LYMPH NODE SAMPLING (Left) Plan for transfer to step-down: see transfer orders  Doing well POD # 1 Left VATS, lung biopsy  CV- stable  RESP- mild  wheezing, add PRN albuterol, continue pulmonary hygiene  No air leak- will d/c anterior CT  D/C A line  PAS + lovenox for DVT prophylaxis  Advance diet  Ambulate   LOS: 1 day    Juline Sanderford C 01/06/2012

## 2012-01-06 NOTE — Progress Notes (Signed)
Report called to [place], RN and met at bedside.  Patient transferred to 3315 and attached to tele monitor with no complications. Cipap machine and belongings traveled with him.

## 2012-01-06 NOTE — Op Note (Signed)
NAMEHARVIS, MABUS NO.:  [id number]  MEDICAL RECORD NO.:  [id number]  LOCATION:  2301                         FACILITY:  [facility]  PHYSICIAN:  Salvatore Decent. Janiya Millirons, M.D.DATE OF BIRTH:  [birth year]  DATE OF PROCEDURE:  01/05/2012 DATE OF DISCHARGE:                              OPERATIVE REPORT   PREOPERATIVE DIAGNOSIS:  Left upper lobe nodule, interstitial lung disease, mediastinal adenopathy.  POSTOPERATIVE DIAGNOSIS:  Left upper lobe nodule, interstitial lung disease, mediastinal adenopathy.  PROCEDURE:  Left video-assisted thoracoscopy, lung biopsy x2, mediastinal lymph node biopsy.  SURGEON:  Salvatore Decent. Dorris Fetch, MD  ASSISTANT:  Doree Fudge, PA  ANESTHESIA:  General.  FINDINGS:  Difficult procedure due to body habitus and extensive adhesions.  Frozen section of lingular biopsy revealed probable interstitial lung disease.  Definitive diagnosis will await permanent sections, unable to attempt to resect left upper lobe nodule due to degree of adhesions.  CLINICAL NOTE:  Mr. Stefanelli is a 61 year old gentleman with a history of asthma dated 2009.  He previously had mediastinal adenopathy and a mediastinoscopy showed lymphoid hyperplasia.  He has had progressively worsening shortness of breath recently.  Workup to this point has been unrevealing; however, CT scan did show significant ground-glass opacities throughout both lower lobes as well as some small lung nodules.  His hilar and mediastinal adenopathy was still present.  The patient was referred for lung biopsy for diagnostic purposes.  The indications, risks, benefits, and alternatives were discussed in detail with the patient.  He understood and accepted the risks as outlined in the history and physical.  He wished to proceed with the biopsy.  OPERATIVE NOTE:  Mr. Beeks was brought to the preoperative holding area on January 05, 2012, there Anesthesia placed a central line and an arterial  blood pressure monitoring line.  Intravenous antibiotics were administered.  He was taken to the operating room, anesthetized, and intubated initially with a single-lumen tube, which was then changed over to a double-lumen tube.  A Foley catheter was placed.  PAS hose were placed for DVT prophylaxis.  The patient was placed in a right lateral decubitus position and the left chest was prepped and draped in usual sterile fashion.  Single lung ventilation of the right lung was carried out and this was relatively well tolerated throughout the procedure.  An incision was made in approximately the seventh intercostal space in the midaxillary line, it was carried through the skin and subcutaneous tissue.  The chest was entered bluntly using a hemostat.  A port was inserted and the thoracoscope was placed into the chest.  It should be noted that this was very difficult due to the patient's body habitus and the ports barely reached the pleural space.  There was limited visualization throughout the procedure.  There were diffuse adhesions of the visceral to parietal pleura.  On first inserting the scope, the diaphragmatic surface of the lung could be visualized, but no space could be identified between the visceral and parietal pleura.  A small working incision then was made anterolaterally that was carried through the skin and subcutaneous tissue.  The serratus muscle fibers were divided and the intercostal muscle was divided.  A finger  was inserted and initial takedown of adhesions with the finger tip was performed, then a sponge stick was placed to take additional adhesions, which did allow for the scope to be advanced and some visualization to occur.  An additional port incisions were made posteriorly below the tip of the scapula.  There were adhesions of the lingula and lower lobe medially to the pericardium, these were difficult to take down from technical standpoint, but the lingula was  freed up.  A biopsy was performed with Endo-GIA stapler.  The specimen was sent for frozen section to ensure that there was pathology.  The lung did have a very granular texture and there was a lymph node palpable in the specimen.  Frozen section showed probable interstitial lung disease, but final determination will require permanent section on additional study.  While awaiting the frozen section, a second biopsy was taken, this one from the lower lobe and this was technically very difficult due to the adhesions and the relative stiffness of the lung.  The specimen was sent for permanent sections, a small piece also was sent for fungal and AFB cultures.  An attempt was made to free up the adhesions sufficiently to allow palpation of the 1 cm nodule in the upper lobe; however, this could not be approached and we would require a full thoracotomy.  Therefore, the decision was made not to attempt to biopsy that area.  There was extensive fatty tissue in the mediastinum and hilar areas.  There was a significantly enlarged lymph node in the left anterior mediastinum adjacent to the pleura on the CT scan, large fatty covered mass was present in this area.  This was removed.  The pleura and fat were incised with electrocautery.  The what appeared to be a nodal mass then was grasped and dissected out that did come out multiple fragments, all of which were sent as a 4L lymph node for permanent sections on additional testing.  A 28-French chest tube was placed anteriorly and a 36-French Blake drain was placed posteriorly through separate subcostal incisions, was secured with #1 silk sutures.  The lung was reinflated.  The thoracoscope was removed.  The posterior port incision was closed with #1 Vicryl suture and a 3-0 Vicryl subcuticular suture.  The anterior utility incision was closed with a running #1 Vicryl suture followed by 2-0 Vicryl subcutaneous suture and a 3-0 Vicryl subcuticular suture.   All sponge, needle, and instrument counts were correct at the end of the procedure. The patient was taken from the operating room to the surgical intensive care unit in good condition.     Salvatore Decent Dorris Fetch, M.D.     SCH/MEDQ  D:  01/05/2012  T:  01/06/2012  Job:  [id number]

## 2012-01-06 NOTE — Telephone Encounter (Signed)
LMTCBx2. Ayme Short, CMA  

## 2012-01-07 ENCOUNTER — Inpatient Hospital Stay (HOSPITAL_COMMUNITY): Payer: PRIVATE HEALTH INSURANCE

## 2012-01-07 LAB — COMPREHENSIVE METABOLIC PANEL
ALT: 31 U/L (ref 0–53)
AST: 37 U/L (ref 0–37)
Albumin: 2 g/dL — ABNORMAL LOW (ref 3.5–5.2)
Alkaline Phosphatase: 59 U/L (ref 39–117)
Calcium: 8.7 mg/dL (ref 8.4–10.5)
Glucose, Bld: 117 mg/dL — ABNORMAL HIGH (ref 70–99)
Potassium: 4.3 mEq/L (ref 3.5–5.1)
Sodium: 134 mEq/L — ABNORMAL LOW (ref 135–145)
Total Protein: 7.2 g/dL (ref 6.0–8.3)

## 2012-01-07 LAB — CBC
Hemoglobin: 10.6 g/dL — ABNORMAL LOW (ref 13.0–17.0)
MCH: 27.9 pg (ref 26.0–34.0)
MCHC: 32.5 g/dL (ref 30.0–36.0)
Platelets: 201 10*3/uL (ref 150–400)

## 2012-01-07 NOTE — Progress Notes (Addendum)
[address]            [place] [address]          [phone]    2 Days Post-Op Procedure(s) (LRB): VIDEO ASSISTED THORACOSCOPY (VATS)/ LYMPH NODE SAMPLING (Left)  Subjective: Patient states his breathing is "fine", has some incisional/chest tube discomfort on the left.  Objective: Vital signs in last 24 hours: Patient Vitals for the past 24 hrs:  BP Temp Temp src Pulse Resp SpO2 Height  01/07/12 0756 - - - - 22  - -  01/07/12 0320 124/62 mmHg 100.2 F (37.9 C) Oral 109  32  94 % -  01/06/12 2326 - - - - 23  91 % -  01/06/12 2320 122/69 mmHg 99.8 F (37.7 C) Oral 118  23  91 % -  01/06/12 2046 - - - - - 96 % -  01/06/12 2012 115/62 mmHg 98.6 F (37 C) Oral 98  25  97 % 6\' 2"  (1.88 m)  01/06/12 2000 - - - - 18  97 % -  01/06/12 1837 134/79 mmHg 97.9 F (36.6 C) Oral 105  25  96 % -  01/06/12 1700 116/73 mmHg - - 95  13  97 % -  01/06/12 1638 - 98.5 F (36.9 C) Oral - - - -  01/06/12 1600 110/68 mmHg - - 97  12  98 % -  01/06/12 1500 127/79 mmHg - - 90  18  94 % -  01/06/12 1400 110/74 mmHg 98 F (36.7 C) Oral 95  12  96 % -  01/06/12 1300 117/78 mmHg - - 98  19  93 % -  01/06/12 1200 115/67 mmHg - - 96  12  95 % -  01/06/12 1152 - - - - 14  96 % -  01/06/12 1131 - 98.4 F (36.9 C) Oral - - - -  01/06/12 1100 130/70 mmHg - - 106  23  90 % -  01/06/12 1000 - - - 98  17  98 % -  01/06/12 0900 - - - 105  22  93 % -  01/06/12 0800 101/61 mmHg - - 99  25  98 % -      Intake/Output from previous day: 10/08 0701 - 10/09 0700 In: 1449.5 [I.V.:1299.5; IV Piggyback:150] Out: 1490 [Urine:1390; Chest Tube:100]   Physical Exam:  Cardiovascular: RRR Pulmonary: Crackles on left Abdomen: Soft, non tender, bowel sounds present. Wounds: Dressing is clean and dry.   Chest Tube: Minor output last 24 hours; no air leak  Lab Results: CBC: Basename 01/07/12 0428 01/06/12 0350  WBC 13.8* 12.2*  HGB 10.6* 11.6*  HCT 32.6* 35.5*  PLT 201  229   BMET:  Basename 01/07/12 0428 01/06/12 0350  NA 134* 138  K 4.3 4.7  CL 101 105  CO2 28 25  GLUCOSE 117* 118*  BUN 7 15  CREATININE 0.66 0.83  CALCIUM 8.7 8.8    PT/INR: No results found for this basename: LABPROT,INR in the last 72 hours ABG:  INR: Will add last result for INR, ABG once components are confirmed Will add last 4 CBG results once components are confirmed  Assessment/Plan:  1. CV - SR. 2.  Pulmonary - Encourage incentive spirometer. Chest tube with 100 cc of output last 24 hours. CXR this am shows low lung volumes, no ptx, bibasilar atelectasis. Consider  placing remaining chest tube to water seal. Await final path results 3.H and H this am 10.6 and 32.6 4. Heplock IVF  ZIMMERMAN,DONIELLE MPA-C 01/07/2012,7:59 AM   Patient seen and examined. Agree with above Low grade temp- IS, mobilize CT to water seal- can probably remove it tomorrow

## 2012-01-07 NOTE — Telephone Encounter (Signed)
Spoke with patient's wife-states that another MD spoke to her and that she was "good" now; I asked which MD and was hung up on.

## 2012-01-08 ENCOUNTER — Inpatient Hospital Stay (HOSPITAL_COMMUNITY): Payer: PRIVATE HEALTH INSURANCE

## 2012-01-08 LAB — TISSUE CULTURE: Culture: NO GROWTH

## 2012-01-08 MED ORDER — MAGNESIUM HYDROXIDE 400 MG/5ML PO SUSP
30.0000 mL | Freq: Once | ORAL | Status: AC
  Administered 2012-01-08: 30 mL via ORAL
  Filled 2012-01-08: qty 30

## 2012-01-08 MED ORDER — POLYETHYLENE GLYCOL 3350 17 G PO PACK
17.0000 g | PACK | Freq: Once | ORAL | Status: AC
  Administered 2012-01-08: 17 g via ORAL
  Filled 2012-01-08: qty 1

## 2012-01-08 MED ORDER — OXYCODONE-ACETAMINOPHEN 5-325 MG PO TABS
1.0000 | ORAL_TABLET | ORAL | Status: DC | PRN
Start: 2012-01-08 — End: 2012-03-08

## 2012-01-08 MED ORDER — GUAIFENESIN ER 600 MG PO TB12: 1200.0000 mg | ORAL_TABLET | Freq: Two times a day (BID) | ORAL | Status: DC

## 2012-01-08 NOTE — Progress Notes (Addendum)
                   [address]            [place] [address]          [phone]    3 Days Post-Op Procedure(s) (LRB): VIDEO ASSISTED THORACOSCOPY (VATS)/ LYMPH NODE SAMPLING (Left)  Subjective: Patient without complaints this am.  Objective: Vital signs in last 24 hours: Patient Vitals for the past 24 hrs:  BP Temp Temp src Pulse Resp SpO2  01/08/12 0400 108/73 mmHg 98.4 F (36.9 C) Oral 102  36  94 %  01/08/12 0005 114/69 mmHg 98.4 F (36.9 C) Oral 105  29  99 %  01/08/12 0000 - - - - 12  98 %  01/07/12 2000 - - - - 29  98 %  01/07/12 1940 106/69 mmHg 98.1 F (36.7 C) Oral 105  29  95 %  01/07/12 1600 136/84 mmHg 98.9 F (37.2 C) Oral - - -  01/07/12 1528 - - - - 19  -  01/07/12 1200 115/75 mmHg 98.1 F (36.7 C) Oral - - -  01/07/12 1141 - 98.1 F (36.7 C) - - 24  -  01/07/12 0833 - - - - - 92 %  01/07/12 0800 120/77 mmHg 98.8 F (37.1 C) Oral - - -  01/07/12 0756 - - - - 22  -      Intake/Output from previous day: 10/09 0701 - 10/10 0700 In: 830 [P.O.:600; I.V.:230] Out: 910 [Urine:800; Chest Tube:110]   Physical Exam:  Cardiovascular: RRR Pulmonary: Crackles on left;diminished at bases bilaterally Abdomen: Soft, non tender, bowel sounds present. Wounds: Dressing is clean and dry.   Chest Tube: Minor output last 24 hours; no air leak  Lab Results: CBC:  Basename 01/07/12 0428 01/06/12 0350  WBC 13.8* 12.2*  HGB 10.6* 11.6*  HCT 32.6* 35.5*  PLT 201 229   BMET:   Basename 01/07/12 0428 01/06/12 0350  NA 134* 138  K 4.3 4.7  CL 101 105  CO2 28 25  GLUCOSE 117* 118*  BUN 7 15  CREATININE 0.66 0.83  CALCIUM 8.7 8.8    PT/INR: No results found for this basename: LABPROT,INR in the last 72 hours ABG:  INR: Will add last result for INR, ABG once components are confirmed Will add last 4 CBG results once components are confirmed  Assessment/Plan:  1. CV - SR. 2.  Pulmonary - Encourage incentive spirometer. Chest tube with  110 cc of output last 24 hours. CXR this am stable (shows low lung volumes, no ptx, bibasilar atelectasis). Chest tube is to water seal. Likely remove today.Await final pathology. 3.LOC constipation 4.Possible discharge 1-2 days  ZIMMERMAN,DONIELLE MPA-C 01/08/2012,7:48 AM   Patient seen and examined. Agree with above D/C CT Path still pending Still on O2- has home O2 and will likely need to go home with it No BM yet- MOM

## 2012-01-08 NOTE — Discharge Summary (Addendum)
Physician Discharge Summary  Patient ID: Chad [name]. MRN: [id number] DOB/AGE: [birth year] 61 y.o.  Admit date: 01/05/2012 Discharge date: 01/09/2012  Admission Diagnoses: 1.Left upper lobe nodule (possible ILD) 2.Mediastinal adenopathy 3.History of GERD 4.Historyof asthma 5.History of tobacco abuse  Discharge Diagnoses:  1.Left upper lobe nodule (possible ILD) 2.Mediastinal adenopathy 3.History of GERD 4.Historyof asthma 5.History of tobacco abuse   Procedure (s):  Left video-assisted thoracoscopy, lung biopsy x2,  mediastinal lymph node biopsy by Dr. Dorris Fetch on 01/05/2012.  History of Presenting Illness: This is a 61 year old gentleman with a history of asthma since 2009. At that time, he had a pleural effusion, pericardial effusion, and mediastinal adenopathy. Mediastinoscopy showed lymphoid hyperplasia. He was eventually treated with prednisone and his symptoms improved, but no definitive diagnosis was ever made.  Over the past couple of months, he's had progressively worsening shortness of breath. This is primarily with exertion but has progressed to the point that even walking a few steps will make and short of breath. He is using oxygen at home. He has been seen by Dr. Shelle Iron and had an extensive workup. It has not revealed a underlying cause of his breathing issues. He had a hypersensitivity workup which was negative. He did have an elevated ANA and ESR. He's not had any unusual exposures, although he's not sure if he may been exposed to mold. A CT scan in September showed significant groundglass opacity throughout both lower lobes and some small lung nodules, the largest being a 10.8 mm nodule in the left upper lobe, his hilar and mediastinal adenopathy changed. He was seen by Dr. Dorris Fetch for the consideration of left VATS, mediastinal lymph node biopsy, and lung biopsies. Potential risks, complications, and benefits were discussed with the patient and he agreed to  proceed. He was admitted on 01/05/2012 to undergo a left VATS, lung biopsy, and mediastinal lymph node biopsy.  Brief Hospital Course:  He has remained afebrile and hemodynamically stable.  His a line and foley were removed early in his post op course. His chest tubes did not have an air leak. Daily chest x rays were obtained and remained stable. His anterior chest tube was removed on post op day one. He was felt surgically stable for transfer from the ICU to 3300 for further convalescence on 01/06/2012. His remaining chest tube was removed on 01/08/2012. Final pathology results are still pending.He has already been tolerating a diet and has had a bowel movement. Provided he remains afebrile, hemodynamically stable, and morning chest x ray remains stable, he will be discharged on 01/09/2012.   Latest Vital Signs: Blood pressure 108/73, pulse 102, temperature 98.4 F (36.9 C), temperature source Oral, resp. rate 36, height 6\' 2"  (1.88 m), weight 284 lb 6.3 oz (129 kg), SpO2 97.00%.  Physical Exam: Cardiovascular: RRR  Pulmonary: Crackles on left;diminished at bases bilaterally  Abdomen: Soft, non tender, bowel sounds present.  Wounds: Dressing is clean and dry.  Chest Tube: Minor output last 24 hours; no air leak   Discharge Condition:Stable  Recent laboratory studies:  Lab Results  Component Value Date   WBC 13.8* 01/07/2012   HGB 10.6* 01/07/2012   HCT 32.6* 01/07/2012   MCV 85.8 01/07/2012   PLT 201 01/07/2012   Lab Results  Component Value Date   NA 134* 01/07/2012   K 4.3 01/07/2012   CL 101 01/07/2012   CO2 28 01/07/2012   CREATININE 0.66 01/07/2012   GLUCOSE 117* 01/07/2012      Diagnostic Studies:  Ct Chest Wo Contrast  12/15/2011  *RADIOLOGY REPORT*  Clinical Data: Bibasilar pneumonia.  Evaluate for interstitial lung disease.  CT CHEST WITHOUT CONTRAST  Technique:  Multidetector CT imaging of the chest was performed following the standard protocol without IV contrast.   Comparison: Chest CT 42,009.  Findings:  Mediastinum: There are borderline enlarged and enlarged mediastinal lymph nodes are again noted, the largest of which is in the anterior mediastinum measuring 17 mm in short axis. Heart size is borderline enlarged. There is no significant pericardial fluid, thickening or pericardial calcification.  The esophagus is unremarkable in appearance.  Lungs/Pleura: Throughout the lungs bilaterally there are areas of ground-glass attenuation with scattered regions of thickening of the peribronchovascular interstitium and some subpleural reticulation.  No frank honeycombing is noted.  Some mild cylindrical bronchiectasis is present in the lower lobes of the lungs bilaterally.  There is a subsolid 11 x 9 mm left upper lobe nodule (image 23 of series 60).  A 6 mm subpleural nodule is noted in the medial aspect of the right upper lobe (image 18 of series 6).  Inspiratory and expiratory imaging is unremarkable.  Upper Abdomen: Unremarkable.  Musculoskeletal: There are no aggressive appearing lytic or blastic lesions noted in the visualized portions of the skeleton.  IMPRESSION: 1.  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 2.Multiple borderline enlarged and mildly enlarged mediastinal and hilar lymph nodes appear to be chronic in this patient, and are similar to prior examination 12/15/2011.  This is nonspecific, and may simply be secondary to underlying interstitial lung disease. 3.  Two pulmonary nodules are noted, one 6 mm pulmonary nodule in the medial aspect of the right upper lobe, and a 9 x 11 mm subsolid nodule the left upper lobe. Initial follow-up by chest CT without contrast is recommended in 3 months to confirm persistence.   This recommendation follows the consensus statement: Recommendations for the Management of Subsolid Pulmonary Nodules  Detected at CT:  A Statement from the Fleischner Society as published in Radiology 2013; 266:304-317.  These results will be called to the ordering clinician or representative by the Radiologist Assistant, and communication documented in the PACS Dashboard.   Original Report Authenticated By: Florencia Reasons, M.D.    Dg Chest Port 1 View  01/08/2012  *RADIOLOGY REPORT*  Clinical Data: Post lung biopsy.  PORTABLE CHEST - 1 VIEW  Comparison: 01/07/2012  Findings: Left chest tube remains in place.  No visible pneumothorax.  Right central line is unchanged.  Cardiomegaly with vascular congestion and diffuse bilateral airspace disease.  No effusions.  No acute bony abnormality.  IMPRESSION: No significant change.  No visible pneumothorax.   Original Report Authenticated By: Cyndie Chime, M.D.    Discharge Medications:   Medication List     As of 01/08/2012  9:19 AM    STOP taking these medications         acetaminophen 325 MG tablet   Commonly known as: TYLENOL      HYDROcodone-homatropine 5-1.5 MG/5ML syrup   Commonly known as: HYCODAN      ibuprofen 200 MG tablet   Commonly known as: ADVIL,MOTRIN      TAKE these medications         albuterol 108 (90 BASE) MCG/ACT inhaler   Commonly known as: PROVENTIL HFA;VENTOLIN HFA   Inhale 2 puffs into the lungs every 6 (six) hours as  needed for wheezing.      cyanocobalamin 2000 MCG tablet   Take 2,000 mcg by mouth as needed. For energy      fluticasone 50 MCG/ACT nasal spray   Commonly known as: FLONASE   Place 2 sprays into the nose daily.      guaiFENesin 600 MG 12 hr tablet   Commonly known as: MUCINEX   Take 2 tablets (1,200 mg total) by mouth 2 (two) times daily. For coughing.      mometasone 220 MCG/INH inhaler   Commonly known as: ASMANEX   Inhale 2 puffs into the lungs daily.      oxyCODONE-acetaminophen 5-325 MG per tablet   Commonly known as: PERCOCET/ROXICET   Take 1-2 tablets by mouth every 4 (four) hours as needed  for pain.           Follow Up Appointments:     Follow-up Information    Follow up with HENDRICKSON,STEVEN C, MD. (PA/LAT CXR to be taken (at [facility] which is in the same building as Dr. Sunday Corn office) on 01/27/2012  At 11:30 am; Appointment with Dr. Dorris Fetch is on 01/27/2012 at 12:30 pm)    Contact information:   [address] [place] [address] [phone]    Call for a follow up appointment with Dr. Shelle Iron for next week      Signed: ZIMMERMAN,DONIELLE MPA-C 01/08/2012, 8:20 AM

## 2012-01-08 NOTE — Progress Notes (Signed)
Pt not wearing cpap due to wearing the end tidal CO2 nasal canula.

## 2012-01-08 NOTE — Progress Notes (Signed)
Left Chest tube removed without event and intact.  Patient tolerated without problem.  Site unremarkable

## 2012-01-09 ENCOUNTER — Telehealth: Payer: Self-pay | Admitting: Internal Medicine

## 2012-01-09 ENCOUNTER — Telehealth: Payer: Self-pay | Admitting: Pulmonary Disease

## 2012-01-09 ENCOUNTER — Inpatient Hospital Stay (HOSPITAL_COMMUNITY): Payer: PRIVATE HEALTH INSURANCE

## 2012-01-09 NOTE — Telephone Encounter (Signed)
Member #[id number]. Pharmacy has been notified of the Nexium APPROVAL through 01/08/2013 and they will contact the pt to let her know she may pick up her medication.

## 2012-01-09 NOTE — Care Management Note (Signed)
    Page 1 of 1   01/09/2012     2:37:15 PM   CARE MANAGEMENT NOTE 01/09/2012  Patient:  Chad [name], Chad [name]   Account Number:  [id number]  Date Initiated:  01/09/2012  Documentation initiated by:  Donn Pierini  Subjective/Objective Assessment:   Pt admitted s/p VATS     Action/Plan:   PTA pt lived at home with spouse   Anticipated DC Date:  01/09/2012   Anticipated DC Plan:  HOME/SELF CARE      DC Planning Services  CM consult      Choice offered to / List presented to:             Status of service:  Completed, signed off Medicare Important Message given?   (If response is "NO", the following Medicare IM given date fields will be blank) Date Medicare IM given:   Date Additional Medicare IM given:    Discharge Disposition:  HOME/SELF CARE  Per UR Regulation:  Reviewed for med. necessity/level of care/duration of stay  If discussed at Long Length of Stay Meetings, dates discussed:    Comments:  01/09/12- 1230- Donn Pierini RN, BSN [phone] Received referral for CM- HHRN needs for home O2/cpap- spoke with pt and wife at bedside - per conversation pt states that he already has home O2 through [place]- wears 2L when at home and 4L on out on the portable. Wife has portable tank in car for going home. Pt also has CPAP at home through [place]- states that he has the adaptor on it to attach O2 if needed and knows how to connect the O2 to the CPAP- there are no orders to change pt's O2 liter flow at discharge- pt states he is ambulating around unit without difficulty. no HHRN needs identified as pt already set up with home O2 and CPAP with adaption for O2 to attach to CPAP- pt to return home with wife.

## 2012-01-09 NOTE — Progress Notes (Signed)
Patient ambulated 450 feet in halls with no walker, O2 on 2L sats at 93%. Patient tolerated well.Will continue to monitor.

## 2012-01-09 NOTE — Progress Notes (Signed)
Patient being discharged home per MD order. All Iv lines removed. All discharge instructions reviewed with both patient and his wife, understanding voiced about instructions. Patient going home on his own O2 tank.

## 2012-01-09 NOTE — Plan of Care (Signed)
Problem: Discharge Progression Outcomes Goal: Pneumonia & Flu vaccines given if indicated Outcome: Adequate for Discharge Patient had PNA 3 years ago, and per Wife, Dr. Shelle Iron wanted patient to wait to after recovery from this surgery. Goal: Home O2 if necessary Outcome: Completed/Met Date Met:  01/09/12 Patient already uses home O2.

## 2012-01-09 NOTE — Progress Notes (Signed)
4 Days Post-Op Procedure(s) (LRB): VIDEO ASSISTED THORACOSCOPY (VATS)/ LYMPH NODE SAMPLING (Left) Subjective: Wants to go home + BM yesterday Still SOB with exertion  Objective: Vital signs in last 24 hours: Temp:  [98.3 F (36.8 C)-98.5 F (36.9 C)] 98.5 F (36.9 C) (10/11 0800) Pulse Rate:  [97-114] 98  (10/11 0805) Cardiac Rhythm:  [-] Normal sinus rhythm (10/11 0815) Resp:  [19-33] 19  (10/11 0805) BP: (117-133)/(69-75) 118/74 mmHg (10/11 0805) SpO2:  [97 %-100 %] 97 % (10/11 0805)  Hemodynamic parameters for last 24 hours:    Intake/Output from previous day: 10/10 0701 - 10/11 0700 In: 1200 [P.O.:740; I.V.:460] Out: 2325 [Urine:2325] Intake/Output this shift: Total I/O In: 260 [P.O.:240; I.V.:20] Out: -   General appearance: alert and no distress Lungs: diminished breath sounds bibasilar Wound: clean and dry  Lab Results:  Basename 01/07/12 0428  WBC 13.8*  HGB 10.6*  HCT 32.6*  PLT 201   BMET:  Basename 01/07/12 0428  NA 134*  K 4.3  CL 101  CO2 28  GLUCOSE 117*  BUN 7  CREATININE 0.66  CALCIUM 8.7    PT/INR: No results found for this basename: LABPROT,INR in the last 72 hours ABG    Component Value Date/Time   PHART 7.321* 01/06/2012 0400   HCO3 29.0* 01/06/2012 0400   TCO2 31 01/06/2012 0400   ACIDBASEDEF 0.1 01/01/2012 1501   O2SAT 94.0 01/06/2012 0400   CBG (last 3)  No results found for this basename: GLUCAP:3 in the last 72 hours  Assessment/Plan: S/P Procedure(s) (LRB): VIDEO ASSISTED THORACOSCOPY (VATS)/ LYMPH NODE SAMPLING (Left) Plan for discharge: see discharge orders POD # 4 left lung biopsy Path- chronic interstitial pneumonia Still on 3 L [place], will try to wean to 2L or less Ambulate Possibly home later today   LOS: 4 days    Chad [name] C 01/09/2012

## 2012-01-09 NOTE — Telephone Encounter (Signed)
Per [facility], the pt is currently in the hospital but should go home today. He needs an appt next week with KC to go over bx results. KC said he prefers the pt be added late in the afternoon but do not add on Mon., 01/12/12 (not even the held slot at 4:30). Ok to double book per [facility].

## 2012-01-09 NOTE — Telephone Encounter (Signed)
Chad [name], this pt needs an apptm with me next week to go over his lung biopsy and talk about treatment.  Thanks.

## 2012-01-10 LAB — ANAEROBIC CULTURE: Gram Stain: NONE SEEN

## 2012-01-12 ENCOUNTER — Telehealth: Payer: Self-pay | Admitting: Pulmonary Disease

## 2012-01-12 NOTE — Telephone Encounter (Signed)
I spoke with spouse and she was wanting to know if pt can see KC sooner than 01/14/12. I spoke with [facility] and he is booked up and pt needs to keep his appt on 01/14/12. I made spouse aware of this and needed nothing further

## 2012-01-12 NOTE — Telephone Encounter (Signed)
Duplicate message see phone 01/09/12

## 2012-01-12 NOTE — Telephone Encounter (Signed)
Pt scheduled to come in 01/14/12 at 9:15

## 2012-01-14 ENCOUNTER — Encounter: Payer: Self-pay | Admitting: Pulmonary Disease

## 2012-01-14 ENCOUNTER — Ambulatory Visit (INDEPENDENT_AMBULATORY_CARE_PROVIDER_SITE_OTHER): Payer: PRIVATE HEALTH INSURANCE | Admitting: Pulmonary Disease

## 2012-01-14 ENCOUNTER — Telehealth: Payer: Self-pay | Admitting: Pulmonary Disease

## 2012-01-14 VITALS — BP 112/78 | HR 106 | Ht 74.0 in | Wt 284.8 lb

## 2012-01-14 DIAGNOSIS — R918 Other nonspecific abnormal finding of lung field: Secondary | ICD-10-CM

## 2012-01-14 MED ORDER — PREDNISONE 20 MG PO TABS: ORAL_TABLET | ORAL | Status: DC

## 2012-01-14 NOTE — Assessment & Plan Note (Signed)
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  He also has a few areas of hemorrhagic infarct.  The etiology for all of this remains unclear, with a comprehensive autoimmune workup being negative except for an elevated sedimentation rate.  He now has worsening respiratory failure and is on oxygen, and therefore will start on high-dose prednisone to see if we can improve things.  We'll also do a lung scan to rule out chronic thromboembolic disease, though I doubt this.  The patient will return to see me in 2 weeks.

## 2012-01-14 NOTE — Addendum Note (Signed)
Addended by: Ozella Almond R on: 01/14/2012 10:24 AM   Modules accepted: Orders

## 2012-01-14 NOTE — Addendum Note (Signed)
Addended by: Ozella Almond R on: 01/14/2012 10:41 AM   Modules accepted: Orders

## 2012-01-14 NOTE — Telephone Encounter (Signed)
Spoke with patients wife; she had some questions that were not answered or relayed to Chad [name] for review this morning at his visit. Patient states that she relayed some of these questions to his nurse Chad [name] but they were not addressed during the visit.   1) what exactly is the condition or diagnosis that her husband has?  2) what type of diet should he follow...what eating/or drinking habits should the patient obtain? Foods/beverages to avoid?  3) Is there any limitation to exercise? Too much? Too little? What type of exercise should he be doing?  4) Could excessive talking have something to do with his cough--could this be causing his cough? (with his job he does a lot of talking)  5) Should he continue the [employer]? Currently using 1xday since D/C from [name].  6) There was a nodule on his CT Scan...what is your opinion of this? Anything to worry about?   Dr Shelle Iron please advise. Thanks.

## 2012-01-14 NOTE — Patient Instructions (Addendum)
Will start on prednisone 40mg  each am on full stomach. Will schedule for lung scan to look for blood clots. Will have [facility] put you on a liquid oxygen system, but you must be on CONTINUOUS oxygen if you are ambulating. followup with me in 2 weeks.

## 2012-01-14 NOTE — Progress Notes (Signed)
  Subjective:    Patient ID: Chad [name]., male    DOB: 05/30/1950, 61 y.o.   MRN: [id number]  HPI The patient comes in today for followup after his recent thoracoscopic lung biopsy for abnormal interstitial and groundglass opacities.  He was found to have chronic interstitial pneumonitis with features of organizing pneumonia, as well as a few isolated areas of hemorrhagic infarct.  I have had a long discussion with he and his wife about the biopsy results.  His autoimmune workup has been totally unremarkable.  The patient is now oxygen dependent, and is having significant desaturations with activity despite being on 2 L pulse.  I suspect he is going to need continuous oxygen with ambulation.  He denies any significant mucus production or purulence.  He does have coughing episodes at times that are dry in nature.   Review of Systems  Constitutional: Negative for fever and unexpected weight change.  HENT: Negative for ear pain, nosebleeds, congestion, sore throat, rhinorrhea, sneezing, trouble swallowing, dental problem, postnasal drip and sinus pressure.   Eyes: Negative for redness and itching.  Respiratory: Positive for cough and shortness of breath. Negative for chest tightness and wheezing.   Cardiovascular: Negative for palpitations and leg swelling.  Gastrointestinal: Negative for nausea and vomiting.  Genitourinary: Negative for dysuria.  Musculoskeletal: Negative for joint swelling.  Skin: Negative for rash.  Neurological: Negative for headaches.  Hematological: Does not bruise/bleed easily.  Psychiatric/Behavioral: Negative for dysphoric mood. The patient is not nervous/anxious.        Objective:   Physical Exam Obese male in no acute distress Nose without purulent discharge noted Neck without lymphadenopathy or thyromegaly Chest with bilateral crackles approximately 1/3 of the way up bilaterally, no wheezing Cardiac exam with regular rate and rhythm Lower extremities with  mild edema, no cyanosis Alert and oriented, moves all 4 extremities.       Assessment & Plan:

## 2012-01-15 NOTE — Telephone Encounter (Signed)
6)  Part of the overall process until proven otherwise.

## 2012-01-15 NOTE — Telephone Encounter (Signed)
Spoke with patients wife in regards to [facility] recs and answers to their questions. Pts wife expressed understanding and appreciation.

## 2012-01-15 NOTE — Telephone Encounter (Signed)
1) I told them.  Chronic interstitial pneumonia with organizing features. 2) he should not overeat while taking prednisone ( I told them this).  Low salt diet to prevent fluid retention.  3) should be limited in postop period.  Speak with your surgeon.  Once cleared, can do anything.  4) yes.  Talking irritates the upper airway 5) can if he wants to.  If he thinks it makes him feel better.

## 2012-01-15 NOTE — Telephone Encounter (Signed)
Lm w/ pt to have cynthia give [place] a call back

## 2012-01-19 ENCOUNTER — Telehealth: Payer: Self-pay | Admitting: Pulmonary Disease

## 2012-01-19 ENCOUNTER — Ambulatory Visit (HOSPITAL_COMMUNITY)
Admission: RE | Admit: 2012-01-19 | Discharge: 2012-01-19 | Disposition: A | Discharge status: Home / Self Care | Payer: PRIVATE HEALTH INSURANCE | Source: Ambulatory Visit | Attending: Pulmonary Disease | Admitting: Pulmonary Disease

## 2012-01-19 ENCOUNTER — Ambulatory Visit (INDEPENDENT_AMBULATORY_CARE_PROVIDER_SITE_OTHER): Payer: PRIVATE HEALTH INSURANCE

## 2012-01-19 VITALS — BP 139/87 | HR 87 | Temp 98.6°F | Resp 20 | Ht 74.0 in | Wt 294.0 lb

## 2012-01-19 DIAGNOSIS — Z4802 Encounter for removal of sutures: Secondary | ICD-10-CM

## 2012-01-19 DIAGNOSIS — R918 Other nonspecific abnormal finding of lung field: Secondary | ICD-10-CM

## 2012-01-19 DIAGNOSIS — J849 Interstitial pulmonary disease, unspecified: Secondary | ICD-10-CM

## 2012-01-19 DIAGNOSIS — Z9889 Other specified postprocedural states: Secondary | ICD-10-CM | POA: Insufficient documentation

## 2012-01-19 DIAGNOSIS — I2510 Atherosclerotic heart disease of native coronary artery without angina pectoris: Secondary | ICD-10-CM

## 2012-01-19 DIAGNOSIS — Z09 Encounter for follow-up examination after completed treatment for conditions other than malignant neoplasm: Secondary | ICD-10-CM

## 2012-01-19 MED ORDER — TECHNETIUM TO 99M ALBUMIN AGGREGATED
2.5000 | Freq: Once | INTRAVENOUS | Status: AC | PRN
  Administered 2012-01-19: 3 via INTRAVENOUS

## 2012-01-19 MED ORDER — TECHNETIUM TC 99M DIETHYLENETRIAME-PENTAACETIC ACID: 40.0000 | Freq: Once | INTRAVENOUS | Status: DC | PRN

## 2012-01-19 NOTE — Telephone Encounter (Signed)
I spoke with spouse and is aware of KC recs. She voiced her understanding and needed nothing further.  

## 2012-01-19 NOTE — Telephone Encounter (Signed)
Unfortunately, there is nothing stronger for the cough.  Would continue with hycodan, and give the prednisone some time to reduce airway inflammation.  It may take a few weeks.  It may help to sleep more upright.

## 2012-01-19 NOTE — Telephone Encounter (Signed)
Spoke with pt's spouse. She states that the pt's cough is not improving. She states that he barely gets 3 hours of sleep without waking up coughing. She hycodan helps, but only for about 3 hours. Cough is prod with moderate clear sputum. She is requesting something stronger for the cough. Please advise thanks! Allergies  Allergen Reactions  . Ivp Dye (Iodinated Diagnostic Agents) Anaphylaxis    Coma for a day in '09  . Lactose Intolerance (Gi) Other (See Comments)    SX: bloating

## 2012-01-20 NOTE — Progress Notes (Unsigned)
Mr. Rodkey presents with complaints of drainage from one of his port sites for a few days...."it stops and starts again".  On exam, all sites are healing well..2 sutured chest tube sites, the mini-thoracotomy site, and the VATS port sites that have been closed subcutaneously. I cleansed the port site in question with saline and a dried area was removed. An undissolved suture was seen and removed.  The are was cleansed with saline, neosporin and a bandaide applied for today only.  The two chest tube sutures were removed also.  He will return as scheduled.

## 2012-01-22 ENCOUNTER — Other Ambulatory Visit: Payer: Self-pay | Admitting: Thoracic Surgery (Cardiothoracic Vascular Surgery)

## 2012-01-22 DIAGNOSIS — R918 Other nonspecific abnormal finding of lung field: Secondary | ICD-10-CM

## 2012-01-27 ENCOUNTER — Encounter: Payer: Self-pay | Admitting: Thoracic Surgery (Cardiothoracic Vascular Surgery)

## 2012-01-27 ENCOUNTER — Ambulatory Visit (INDEPENDENT_AMBULATORY_CARE_PROVIDER_SITE_OTHER): Payer: Self-pay | Admitting: Thoracic Surgery (Cardiothoracic Vascular Surgery)

## 2012-01-27 VITALS — BP 120/73 | HR 96 | Resp 26 | Ht 74.0 in | Wt 294.0 lb

## 2012-01-27 DIAGNOSIS — Z09 Encounter for follow-up examination after completed treatment for conditions other than malignant neoplasm: Secondary | ICD-10-CM

## 2012-01-27 DIAGNOSIS — J8409 Other alveolar and parieto-alveolar conditions: Secondary | ICD-10-CM

## 2012-01-27 DIAGNOSIS — J849 Interstitial pulmonary disease, unspecified: Secondary | ICD-10-CM

## 2012-01-27 NOTE — Progress Notes (Signed)
HPI:  [name] returns today for postop followup visit. He had a left lung biopsy on 01/05/2012. This was technically difficult due to his size as well as adhesions. We had hoped to resect a small left upper lobe nodule with one biopsy specimens, but that was not possible due to technical limitations. Pathology showed chronic interstitial pneumonia and organizing pneumonia. There also was an area of hemorrhagic infarct. Dr. Shelle Iron did a V/Q scan on 01/19/2012 which showed no evidence of pulmonary emboli.  He says he still has some incisional pain. He has been taking Percocet 2 or 3 times a day for that. His prescription has run out. His breathing is improved since he was started on prednisone.  Past Medical History  Diagnosis Date  . Colon polyp   . GERD (gastroesophageal reflux disease)   . Asthma   . Pleural effusion 2009    with neg w/u   . Pericardial effusion 2009    self resolved  . Tendonitis     left shoulder  . Mediastinal lymphadenopathy 2009    Mediastinoscopy : no granuloma or lymphoma, resolved with steroids  . Sinusitis, chronic   . Sleep apnea     x 15 years; no recent sleep study;wears CPAP  . Shortness of breath   . Oxygen dependent     via [place] 4 l/min  . Hypothyroidism     took meds many years ago      Current Outpatient Prescriptions  Medication Sig Dispense Refill  . cyanocobalamin 2000 MCG tablet Take 2,000 mcg by mouth as needed. For energy      . fluticasone (FLONASE) 50 MCG/ACT nasal spray Place 2 sprays into the nose daily.      Marland Kitchen HYDROcodone-homatropine (HYCODAN) 5-1.5 MG/5ML syrup       . oxyCODONE-acetaminophen (PERCOCET/ROXICET) 5-325 MG per tablet Take 1-2 tablets by mouth every 4 (four) hours as needed for pain.  40 tablet  0  . predniSONE (DELTASONE) 20 MG tablet Take 2 tablets on a full stomach in the morning  60 tablet  3    Physical Exam BP 120/73  Pulse 96  Resp 26  Ht 6\' 2"  (1.88 m)  Wt 294 lb (133.358 kg)  BMI 37.75 kg/m2  SpO65  64% 61 year old male in no acute distress Incisions well-healed Lungs diminished breath sounds bilaterally  Diagnostic Tests: none  Impression: 61 year old male with chronic interstitial lung disease. This turned out to be interstitial pneumonia and organizing pneumonia. He's being treated with prednisone and has seen a significant improvement in his breathing since that was started.  From a surgical standpoint he's doing well. He does still have some incisional pain which is not unexpected. I gave him a prescription for oxycodone 5 mg tablets one to 2-3 times daily as needed for pain, dispense 40 tablets no refills. He may also use nonsteroidal anti-inflammatory such as Aleve or Advil. His activities are unrestricted although he was cautioned not to drive while using the pain medication. He works as a Paediatric nurse, I think he is probably about another month away from being able to go back to work.  He does have a lung nodule in the left upper lobe seen on the CT. This was not accessible at surgery without expanding the operation to a full-blown thoracotomy. In all likelihood this is another area of organizing pneumonia.  Plan: He'll continue to follow with Dr. Marcelyn Bruins  I will be happy to see him back any time if I can be of  any further assistance with his care.

## 2012-02-01 LAB — FUNGUS CULTURE W SMEAR: Fungal Smear: NONE SEEN

## 2012-02-02 ENCOUNTER — Telehealth: Payer: Self-pay | Admitting: Pulmonary Disease

## 2012-02-02 NOTE — Telephone Encounter (Signed)
Pt is scheduled to see [facility] 02/09/12. Nothing further was needed

## 2012-02-02 NOTE — Telephone Encounter (Signed)
lmomtcb x1 for pt 

## 2012-02-02 NOTE — Telephone Encounter (Signed)
Returning call can be reached at [phone].Chad [name]

## 2012-02-02 NOTE — Telephone Encounter (Signed)
i need to see him back this week.  See if a slot in afternoon?? Such as 430?  If not, can add him on thurs am at the end of the morning.

## 2012-02-02 NOTE — Telephone Encounter (Signed)
I spoke with pt and he stated he saw [facility] on 01/14/12 told to f/u in 2 weeks. Per pt he had today's date written on his check out sheet at 10:00 for an appt with KC. I advised pt KC was not in the morning, pt had shown up here for an appt per pt. Next available isn't until 02/18/12. Is this okay for pt to come in since you are booked up Dr. Shelle Iron. Also per tp he will be out of prednisone if he does have to wait until 02/18/12. Please advise thanks.

## 2012-02-09 ENCOUNTER — Ambulatory Visit: Payer: PRIVATE HEALTH INSURANCE | Admitting: Pulmonary Disease

## 2012-02-09 ENCOUNTER — Encounter: Payer: Self-pay | Admitting: Pulmonary Disease

## 2012-02-09 ENCOUNTER — Ambulatory Visit (INDEPENDENT_AMBULATORY_CARE_PROVIDER_SITE_OTHER): Payer: PRIVATE HEALTH INSURANCE | Admitting: Pulmonary Disease

## 2012-02-09 VITALS — BP 122/74 | HR 84 | Temp 98.0°F | Ht 74.0 in | Wt 287.8 lb

## 2012-02-09 DIAGNOSIS — R918 Other nonspecific abnormal finding of lung field: Secondary | ICD-10-CM

## 2012-02-09 MED ORDER — PREDNISONE 20 MG PO TABS: 10.0000 mg | ORAL_TABLET | Freq: Every day | ORAL | Status: DC

## 2012-02-09 MED ORDER — PREDNISONE 10 MG PO TABS: ORAL_TABLET | ORAL | Status: DC

## 2012-02-09 NOTE — Progress Notes (Signed)
  Subjective:    Patient ID: Chad [name]., male    DOB: 08/09/1950, 61 y.o.   MRN: [id number]  HPI The patient comes in today for followup of his known chronic interstitial pneumonia noted on vats lung biopsy.  He has been unrevealing autoimmune workup, and was started on prednisone at the last visit at 40 mg a day.  He has had a dramatic response, with significant improvement in his breathing.  He denies any significant cough, congestion, or mucus.  He has tolerated the prednisone quite well overall.  He has had a followup chest x-ray that showed improvement in his infiltrates.   Review of Systems  Constitutional: Negative for fever and unexpected weight change.  HENT: Negative for ear pain, nosebleeds, congestion, sore throat, rhinorrhea, sneezing, trouble swallowing, dental problem, postnasal drip and sinus pressure.   Eyes: Negative for redness and itching.  Respiratory: Positive for shortness of breath ( upon activity ). Negative for cough, chest tightness and wheezing.   Cardiovascular: Negative for palpitations and leg swelling.  Gastrointestinal: Negative for nausea and vomiting.  Genitourinary: Negative for dysuria.  Musculoskeletal: Negative for joint swelling.  Skin: Negative for rash.  Neurological: Negative for headaches.  Hematological: Does not bruise/bleed easily.  Psychiatric/Behavioral: Negative for dysphoric mood. The patient is not nervous/anxious.        Objective:   Physical Exam Overweight male in no acute distress Nose without purulence or discharge noted Neck without lymphadenopathy or thyromegaly Chest with a few scattered crackles, no wheezing Cardiac exam with regular rate and rhythm Lower extremities no significant edema, no cyanosis Alert and or gait, moves all 4 extremities.       Assessment & Plan:

## 2012-02-09 NOTE — Patient Instructions (Addendum)
Decrease prednisone to 35mg  each day for one week, then 30mg  each day for one week, then 25mg  each day for one week, then go to 20mg  each day until followup visit with me.  You need to stay on oxygen with heavier exertional activity, but hopefully can come off in next 4 weeks.  followup with me in 4 weeks.

## 2012-02-09 NOTE — Assessment & Plan Note (Signed)
The patient has had dramatic improvement since being on prednisone, and feels that his breathing is at least 60-70% improved.  His oxygenation is much better as well.  At this point, I would like to slowly taper his prednisone dosing down to 20 mg a day.  He will let me know if his symptoms worsen during this time.

## 2012-02-09 NOTE — Addendum Note (Signed)
Addended by: Ozella Almond R on: 02/09/2012 04:05 PM   Modules accepted: Orders

## 2012-02-12 ENCOUNTER — Other Ambulatory Visit: Payer: Self-pay | Admitting: Pulmonary Disease

## 2012-02-12 MED ORDER — PREDNISONE 20 MG PO TABS: 10.0000 mg | ORAL_TABLET | Freq: Every day | ORAL | Status: DC

## 2012-02-12 MED ORDER — PREDNISONE 10 MG PO TABS: ORAL_TABLET | ORAL | Status: DC

## 2012-02-17 LAB — AFB CULTURE WITH SMEAR (NOT AT ARMC)

## 2012-03-08 ENCOUNTER — Ambulatory Visit (INDEPENDENT_AMBULATORY_CARE_PROVIDER_SITE_OTHER): Payer: PRIVATE HEALTH INSURANCE | Admitting: Pulmonary Disease

## 2012-03-08 ENCOUNTER — Ambulatory Visit (INDEPENDENT_AMBULATORY_CARE_PROVIDER_SITE_OTHER)
Admission: RE | Admit: 2012-03-08 | Discharge: 2012-03-08 | Disposition: A | Discharge status: Home / Self Care | Payer: PRIVATE HEALTH INSURANCE | Source: Ambulatory Visit | Attending: Pulmonary Disease | Admitting: Pulmonary Disease

## 2012-03-08 ENCOUNTER — Encounter: Payer: Self-pay | Admitting: Pulmonary Disease

## 2012-03-08 ENCOUNTER — Telehealth: Payer: Self-pay | Admitting: Pulmonary Disease

## 2012-03-08 VITALS — BP 114/66 | HR 101 | Temp 97.9°F | Ht 74.0 in | Wt 288.0 lb

## 2012-03-08 DIAGNOSIS — R918 Other nonspecific abnormal finding of lung field: Secondary | ICD-10-CM

## 2012-03-08 DIAGNOSIS — Z23 Encounter for immunization: Secondary | ICD-10-CM

## 2012-03-08 MED ORDER — PREDNISONE 20 MG PO TABS: ORAL_TABLET | ORAL | Status: DC

## 2012-03-08 MED ORDER — PREDNISONE 5 MG PO TABS: 5.0000 mg | ORAL_TABLET | ORAL | Status: DC

## 2012-03-08 NOTE — Patient Instructions (Addendum)
Decrease prednisone to 17.5mg  each day for a week, then 15mg  each day for a week, then 12.5mg  each day for a week, then to 10mg  a day and stay there until next visit.  Let me know if you see a big change in your breathing as the prednisone dose is being tapered. Work on [employer] loss and conditioning program Will check a cxr today, and call you with results. No longer need oxygen with exertion.  Will check your overnight oxygen level while sleeping to see if you still need this.  Will call you with results.  followup with me in 2 mos.

## 2012-03-08 NOTE — Assessment & Plan Note (Signed)
The patient is doing very well from a pulmonary standpoint on tapering prednisone.  He is down to 20 mg a day, and I would like to continue to a maintenance dose of 10 mg per day.  He understands there is no set time frame for treatment with prednisone.  He is to let me know if he sees a decline in his breathing as the prednisone dose is being decreased.  We'll check a chest x-ray today, and we'll also check his ambulatory oximetry.

## 2012-03-08 NOTE — Addendum Note (Signed)
Addended by: Caryl Ada on: 03/08/2012 12:13 PM   Modules accepted: Orders

## 2012-03-08 NOTE — Progress Notes (Signed)
  Subjective:    Patient ID: Chad [name]., male    DOB: 07-26-1950, 61 y.o.   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  He has been on prednisone for this, and has seen a significant improvement in his breathing as well as chest x-ray.  He has been tapered down to 20 mg a day, and may have seen some decline in his exertional tolerance since the last visit.  I have told him he also needs to keep in mind that his weight and deconditioning are contributing to his dyspnea on exertion as well.  He denies any significant cough or mucus production.  He currently is not wearing his oxygen with exertion and rarely at night.   Review of Systems  Constitutional: Negative for fever and unexpected weight change.  HENT: Positive for nosebleeds. Negative for ear pain, congestion, sore throat, rhinorrhea, sneezing, trouble swallowing, dental problem, postnasal drip and sinus pressure.   Eyes: Negative for redness and itching.  Respiratory: Negative for cough, chest tightness, shortness of breath and wheezing.   Cardiovascular: Negative for palpitations and leg swelling.  Gastrointestinal: Negative for nausea and vomiting.  Genitourinary: Negative for dysuria.  Musculoskeletal: Positive for myalgias and arthralgias. Negative for joint swelling.  Skin: Positive for rash.  Neurological: Negative for headaches.  Hematological: Does not bruise/bleed easily.  Psychiatric/Behavioral: Negative for dysphoric mood. The patient is not nervous/anxious.        Objective:   Physical Exam Obese male in no acute distress Nose without purulence or discharge noted Neck without lymphadenopathy or thyromegaly Chest with a few rare crackles, but otherwise totally clear Cardiac exam with regular rate and rhythm Lower extremities with no significant edema, no cyanosis Alert and oriented, moves all 4 extremities.        Assessment & Plan:

## 2012-03-08 NOTE — Telephone Encounter (Signed)
Notes Recorded by Gwynneth Albright, CMA on 03/08/2012 at 2:03 PM LMOMTCB x 2 at home and mobile numbers. ------  Notes Recorded by Barbaraann Share, MD on 03/08/2012 at 1:54 PM Please let pt know that cxr has improved further, but has not totally cleared. Not a surprise.  Pt advised.Pt also states he needs another rx for prednisone. He has 20mg  tablets but also needs rx for 5 mg because he will be taking 17.5mg . Rx sent. Carron Curie, CMA

## 2012-03-09 ENCOUNTER — Telehealth: Payer: Self-pay | Admitting: Pulmonary Disease

## 2012-03-09 NOTE — Telephone Encounter (Signed)
Spoke with patient and informed him of results/recs as listed below per Dr. Shelle Iron.  PAtient verbalized understanding, however would like to have a better idea of how his lungs have improved and is requesting a "percentage" of improvement.  Please advise  Patient also stating that an order was sent for his o2 to be d/c'd.  But since this has been done patient has since started using treadmill and feels he may actually need his o2 tanks "sometimes" and would like to keep them.  Please advise thank you   Notes Recorded by Barbaraann Share, MD on 03/08/2012 at 1:54 PM Please let pt know that cxr has improved further, but has not totally cleared. Not a surprise.

## 2012-03-10 NOTE — Telephone Encounter (Signed)
I spoke with pt and is aware of KC recs. He voiced his understanding and had no further questions.

## 2012-03-10 NOTE — Telephone Encounter (Signed)
Let pt know that by estimate, his cxr is 75% better.  They need to remember (as I have told them many times), this is a slow process and make take months to resolve.  Need to be patient.  There is no magic bullet to make this all better. Regarding the oxygen, if he does not have desats less than 88%, he does not require oxygen from a medical standpoint.  Compare it to a 61 y/o football player who runs 100 yards for a touchdown, but then goes to the bench for oxygen because he is sob.  His sats are 100%, and therefore oxygen does nothing for him.

## 2012-04-09 ENCOUNTER — Encounter: Payer: Self-pay | Admitting: Internal Medicine

## 2012-04-12 ENCOUNTER — Other Ambulatory Visit (INDEPENDENT_AMBULATORY_CARE_PROVIDER_SITE_OTHER): Payer: PRIVATE HEALTH INSURANCE

## 2012-04-12 DIAGNOSIS — R972 Elevated prostate specific antigen [PSA]: Secondary | ICD-10-CM

## 2012-04-12 DIAGNOSIS — D696 Thrombocytopenia, unspecified: Secondary | ICD-10-CM

## 2012-04-12 LAB — CBC WITH DIFFERENTIAL/PLATELET
Basophils Absolute: 0 10*3/uL (ref 0.0–0.1)
Basophils Relative: 0.3 % (ref 0.0–3.0)
Eosinophils Absolute: 0.1 10*3/uL (ref 0.0–0.7)
MCHC: 33.3 g/dL (ref 30.0–36.0)
MCV: 84.6 fl (ref 78.0–100.0)
Monocytes Absolute: 0.9 10*3/uL (ref 0.1–1.0)
Neutro Abs: 9.3 10*3/uL — ABNORMAL HIGH (ref 1.4–7.7)
Neutrophils Relative %: 72.3 % (ref 43.0–77.0)
RBC: 4.61 Mil/uL (ref 4.22–5.81)
RDW: 17.8 % — ABNORMAL HIGH (ref 11.5–14.6)

## 2012-04-12 LAB — PSA: PSA: 6.49 ng/mL — ABNORMAL HIGH (ref 0.10–4.00)

## 2012-04-19 ENCOUNTER — Ambulatory Visit: Payer: PRIVATE HEALTH INSURANCE | Admitting: Internal Medicine

## 2012-04-22 ENCOUNTER — Ambulatory Visit (INDEPENDENT_AMBULATORY_CARE_PROVIDER_SITE_OTHER): Payer: PRIVATE HEALTH INSURANCE | Admitting: Internal Medicine

## 2012-04-22 ENCOUNTER — Encounter: Payer: Self-pay | Admitting: Internal Medicine

## 2012-04-22 VITALS — BP 114/74 | HR 98 | Temp 97.8°F | Wt 293.0 lb

## 2012-04-22 DIAGNOSIS — B370 Candidal stomatitis: Secondary | ICD-10-CM

## 2012-04-22 DIAGNOSIS — E039 Hypothyroidism, unspecified: Secondary | ICD-10-CM

## 2012-04-22 DIAGNOSIS — R918 Other nonspecific abnormal finding of lung field: Secondary | ICD-10-CM

## 2012-04-22 DIAGNOSIS — R7309 Other abnormal glucose: Secondary | ICD-10-CM

## 2012-04-22 DIAGNOSIS — R972 Elevated prostate specific antigen [PSA]: Secondary | ICD-10-CM

## 2012-04-22 HISTORY — DX: Candidal stomatitis: B37.0

## 2012-04-22 LAB — BASIC METABOLIC PANEL
BUN: 16 mg/dL (ref 6–23)
CO2: 26 mEq/L (ref 19–32)
Calcium: 9.2 mg/dL (ref 8.4–10.5)
Chloride: 103 mEq/L (ref 96–112)
Creatinine, Ser: 0.8 mg/dL (ref 0.4–1.5)
Glucose, Bld: 96 mg/dL (ref 70–99)

## 2012-04-22 MED ORDER — BAYER MICROLET LANCETS MISC: 1.0000 | Freq: Every day | Status: DC

## 2012-04-22 MED ORDER — GLUCOSE BLOOD VI STRP: 1.0000 | ORAL_STRIP | Freq: Every day | Status: DC

## 2012-04-22 MED ORDER — NYSTATIN 100000 UNIT/ML MT SUSP: 500000.0000 [IU] | Freq: Four times a day (QID) | OROMUCOSAL | Status: DC

## 2012-04-22 NOTE — Assessment & Plan Note (Signed)
Management as per pulmonary.  Monitor for hyperglycemia while on prednisone.

## 2012-04-22 NOTE — Progress Notes (Signed)
Subjective:    Patient ID: Chad [name]., male    DOB: 05-Nov-1950, 62 y.o.   MRN: [id number]  HPI  62 year old African American male for followup regarding hospitalization for shortness of breath and elevated PSA. Patient admitted on 01/05/2012 secondary to progressive worsening shortness of breath. Patient was evaluated by pulmonary in 2009. Patient found to have pleural effusion, pericardial effusion and mediastinal adenopathy. Mediastinoscopy noted to show lymphoid hyperplasia and he was treated with prednisone but no definitive diagnosis was ever made.  During hospitalization the patient underwent lung biopsy.  Patient diagnosed with interstitial lung disease organizing pneumonia. Patient initially started on 40 mg of prednisone.  He has been able to taper to 10 mg. He notes increased thirst but denies polyuria. He has history of abnormal glucose in the past. He has not been checking his blood sugars.  He was referred to urologist for elevated PSA. Prostate biopsy was planned but deferred due to hospitalization in October. Recent PSA is slightly higher.  Review of Systems Negative for chest pain or shortness of breath He denies having pets at home  Past Medical History  Diagnosis Date  . Colon polyp   . GERD (gastroesophageal reflux disease)   . Asthma   . Pleural effusion 2009    with neg w/u   . Pericardial effusion 2009    self resolved  . Tendonitis     left shoulder  . Mediastinal lymphadenopathy 2009    Mediastinoscopy : no granuloma or lymphoma, resolved with steroids  . Sinusitis, chronic   . Sleep apnea     x 15 years; no recent sleep study;wears CPAP  . Shortness of breath   . Oxygen dependent     via [place] 4 l/min  . Hypothyroidism     took meds many years ago    History   Social History  . Marital Status: Married    Spouse Name: N/A    Number of Children: 1  . Years of Education: N/A   Occupational History  . [employer]     part time   Social History  Main Topics  . Smoking status: Former Smoker -- 0.2 packs/day for 5 years    Types: Cigarettes, Cigars    Quit date: 04/17/2001  . Smokeless tobacco: Never Used     Comment: 1-2 cigars occasionally x 2-3 yrs  . Alcohol Use: Yes  . Drug Use: No  . Sexually Active: Not on file   Other Topics Concern  . Not on file   Social History Narrative   Patient states former smokerRetired from post officePart time barberMarried (2nd)Daughter from 1st marriage    Past Surgical History  Procedure Date  . Mediastinoscopy 2009    neg biopsy  . Nasal sinus surgery     Family History  Problem Relation Age of Onset  . Heart attack Father   . Cancer Father     prostate  . Diabetes Daughter     Allergies  Allergen Reactions  . Ivp Dye (Iodinated Diagnostic Agents) Anaphylaxis    Coma for a day in '09  . Lactose Intolerance (Gi) Other (See Comments)    SX: bloating    No current outpatient prescriptions on file prior to visit.    BP 114/74  Pulse 98  Temp 97.8 F (36.6 C) (Oral)  Wt 293 lb (132.904 kg)       Objective:   Physical Exam  Constitutional: He is oriented to person, place, and time. He appears well-developed  and well-nourished.  HENT:  Head: Normocephalic and atraumatic.       Whitish film on tongue  Eyes: EOM are normal. Pupils are equal, round, and reactive to light.  Neck: Neck supple.       No carotid bruit  Cardiovascular: Normal rate, regular rhythm and normal heart sounds.   Pulmonary/Chest: Effort normal and breath sounds normal. He has no wheezes.  Musculoskeletal:       Trace lower extremity edema bilaterally  Neurological: He is alert and oriented to person, place, and time. No cranial nerve deficit.  Psychiatric: He has a normal mood and affect. His behavior is normal.          Assessment & Plan:

## 2012-04-22 NOTE — Assessment & Plan Note (Signed)
Monitor A1c. Pateint encouraged to avoid sweets and follow low carb diet.

## 2012-04-22 NOTE — Assessment & Plan Note (Signed)
PSA trending higher. Patient advised to followup with urologist for prostate biopsy.

## 2012-04-22 NOTE — Patient Instructions (Addendum)
Follow up with urologist re: elevated PSA We will contact you re: blood test results Please avoid sweets and reduce your carbohydrate intake to 30 grams per meal. Check you blood sugar as directed

## 2012-04-29 ENCOUNTER — Other Ambulatory Visit (INDEPENDENT_AMBULATORY_CARE_PROVIDER_SITE_OTHER): Payer: PRIVATE HEALTH INSURANCE

## 2012-04-29 DIAGNOSIS — R82998 Other abnormal findings in urine: Secondary | ICD-10-CM

## 2012-04-29 DIAGNOSIS — R8290 Unspecified abnormal findings in urine: Secondary | ICD-10-CM

## 2012-04-29 LAB — POCT URINALYSIS DIPSTICK
Bilirubin, UA: NEGATIVE
Blood, UA: NEGATIVE
Glucose, UA: NEGATIVE
Ketones, UA: NEGATIVE
Nitrite, UA: NEGATIVE
Spec Grav, UA: 1.01
pH, UA: 6.5

## 2012-05-03 ENCOUNTER — Telehealth: Payer: Self-pay | Admitting: *Deleted

## 2012-05-03 NOTE — Telephone Encounter (Signed)
Pt aware.

## 2012-05-03 NOTE — Telephone Encounter (Signed)
Yes , he can stop checking

## 2012-05-03 NOTE — Telephone Encounter (Signed)
Dr Artist Pais asked pt to check his blood sugar for a week and it has not gone over 99 all week.  He was wondering if he could stop checking it?

## 2012-05-10 ENCOUNTER — Ambulatory Visit (INDEPENDENT_AMBULATORY_CARE_PROVIDER_SITE_OTHER): Payer: PRIVATE HEALTH INSURANCE | Admitting: Pulmonary Disease

## 2012-05-10 ENCOUNTER — Encounter: Payer: Self-pay | Admitting: Pulmonary Disease

## 2012-05-10 VITALS — BP 118/82 | HR 99 | Temp 98.2°F | Ht 74.0 in | Wt 292.8 lb

## 2012-05-10 DIAGNOSIS — R918 Other nonspecific abnormal finding of lung field: Secondary | ICD-10-CM

## 2012-05-10 NOTE — Assessment & Plan Note (Signed)
The patient has had a significant improvement in his clinical status and chest x-ray while on steroids.  He is now down to 10 mg a day, and I would like to reimage his chest with a CT before deciding about tapering further.  I will call him once the CT scan is done to discuss his prednisone dosing.  I have also stressed to him the importance of aggressive weight loss and some type of conditioning program.

## 2012-05-10 NOTE — Progress Notes (Signed)
  Subjective:    Patient ID: Chad Osgood., male    DOB: April 24, 1950, 62 y.o.   MRN: [id number]  HPI The patient comes in today for followup of his pulmonary infiltrates, felt secondary to chronic interstitial pneumonia.  He has been treated with prednisone, and weaned to 10 mg a day from the last visit.  He did see a small decline in his breathing from the 20 mg a day, but has since stabilized.  He does not feel that his breathing overly worsened from the last visit.  He continues to have dyspnea on exertion, but I also explained that his weight and deconditioning playing a significant role in this as well.   Review of Systems  Constitutional: Negative for fever and unexpected weight change.  HENT: Negative for ear pain, nosebleeds, congestion, sore throat, rhinorrhea, sneezing, trouble swallowing, dental problem, postnasal drip and sinus pressure.   Eyes: Negative for redness and itching.  Respiratory: Negative for cough, chest tightness, shortness of breath and wheezing.   Cardiovascular: Negative for palpitations and leg swelling.  Gastrointestinal: Negative for nausea and vomiting.  Genitourinary: Negative for dysuria.  Musculoskeletal: Negative for joint swelling.  Skin: Negative for rash.  Neurological: Negative for headaches.  Hematological: Does not bruise/bleed easily.  Psychiatric/Behavioral: Negative for dysphoric mood. The patient is not nervous/anxious.        Objective:   Physical Exam Obese male in no acute distress Nose without purulence or discharge noted Neck without lymphadenopathy or thyromegaly Chest with a rare crackle, no wheezes or rhonchi.  Excellent airflow bilaterally. Lower extremities with minimal edema, no cyanosis Alert and oriented, moves all 4 extremities.       Assessment & Plan:

## 2012-05-10 NOTE — Patient Instructions (Addendum)
Will leave you on 10mg  of prednisone a day until I see the scan of your chest.  Will call you with results. Work on weight loss and conditioning. Will make a decision about next followup visit after your chest scan.

## 2012-05-13 ENCOUNTER — Other Ambulatory Visit: Payer: PRIVATE HEALTH INSURANCE

## 2012-05-17 ENCOUNTER — Ambulatory Visit (INDEPENDENT_AMBULATORY_CARE_PROVIDER_SITE_OTHER)
Admission: RE | Admit: 2012-05-17 | Discharge: 2012-05-17 | Disposition: A | Discharge status: Home / Self Care | Payer: PRIVATE HEALTH INSURANCE | Source: Ambulatory Visit | Attending: Pulmonary Disease | Admitting: Pulmonary Disease

## 2012-05-17 ENCOUNTER — Telehealth: Payer: Self-pay | Admitting: *Deleted

## 2012-05-17 DIAGNOSIS — R918 Other nonspecific abnormal finding of lung field: Secondary | ICD-10-CM

## 2012-05-17 NOTE — Telephone Encounter (Signed)
ONO results are in Jaci Desanto folder for review---I found these in your pink folder. I could not find in chart where these results have been explained to patient.

## 2012-05-17 NOTE — Telephone Encounter (Signed)
Patient aware of results and recs per KC. 

## 2012-05-17 NOTE — Telephone Encounter (Signed)
Let pt know that his ONO on cpap and no oxygen shows an adequate oxygen level.  He does not need oxygen while sleeping as long as he is wearing his cpap.

## 2012-05-19 ENCOUNTER — Telehealth: Payer: Self-pay | Admitting: Pulmonary Disease

## 2012-05-19 NOTE — Telephone Encounter (Signed)
Pt is requesting CT results from 05-17-12. Pt does not want to wait until [facility] return. I spoke with MR and he stated he would review the CT. Please advise. Carron Curie, CMA

## 2012-05-19 NOTE — Telephone Encounter (Signed)
triage,  As best I can say CT scan of the chest on 05/17/2012 shows that the left upper lobe nodule is now resolved. In addition there is some mild improvement in the overall inflammation in the lung. Patient has to Dr. Dr Marcelyn Bruins directly upon the return of Dr Marcelyn Bruins about what to do with prednisone dosing but up until then patient should continue the same prednisone dosing  After communicating with the patient please forward this phone note the Dr Marcelyn Bruins for his records  Thanks  MR    From 05/17/2012 IMPRESSION:  1. Mild improvement in overall aeration bilaterally with slight  decrease in the amount of patchy pulmonary parenchymal ground-  glass. 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  2. Previously seen left upper lobe nodule is no longer identified.  Original Report Authenticated By: Leanna Battles, Judie Petit

## 2012-05-20 NOTE — Telephone Encounter (Signed)
I spoke with pt and is aware of MR response regarding CT results. Pt verbalized his understanding and had no questions. Will forward message over to [facility] for when he returns

## 2012-05-27 NOTE — Telephone Encounter (Signed)
Dr. Shelle Iron can we sign off this message? Please advise thanks

## 2012-06-11 HISTORY — PX: OTHER SURGICAL HISTORY: SHX169

## 2012-07-27 ENCOUNTER — Encounter: Payer: Self-pay | Admitting: *Deleted

## 2012-07-27 ENCOUNTER — Encounter: Payer: Self-pay | Admitting: Radiation Oncology

## 2012-07-27 DIAGNOSIS — C61 Malignant neoplasm of prostate: Secondary | ICD-10-CM | POA: Insufficient documentation

## 2012-07-27 NOTE — Progress Notes (Signed)
Radiation Oncology         [phone]) [phone] ________________________________  Initial outpatient Consultation  Name: Chad [name]. MRN: [id number]  Date: 07/28/2012  DOB: 1950-11-09  WJ:[id number] Artist Pais, DO  Marcine Matar, MD   REFERRING PHYSICIAN: Marcine Matar, MD  DIAGNOSIS: 62 y.o. gentleman with stage T1c adenocarcinoma of the prostate with a Gleason's score of 3+4 and a PSA of 6.49  HISTORY OF PRESENT ILLNESS::Chad [name]. is a 62 y.o. gentleman.  He was noted to have an elevated PSA of 5.69 on 08/15/11 by his primary care physician, Dr. Artist Pais.  He was referred to Dr. Retta Diones on 10/20/11 and digital rectal examination was performed at that time revealed a 2+ gland with no nodules.  His PSA remained elevated at 4.84 on 10/20/11 .  Accordingly, he was he was recommended for biopsy, but delayed this procedure initially due to pulmonary issues.  A repeat PSA was performed in January which remained elevated to 6.49  The patient proceeded to transrectal ultrasound with 12 biopsies of the prostate on 06/09/12.  The prostate volume measured 31.14 cc.  Out of 12 core biopsies, 5 were positive.  The maximum Gleason score was 3+4, and this was seen in 20% of the right base.  Gleason's 3+3 was seen in the right lateral base, right lateral mid, left lateral mid, and left lateral apex specimens ranging from 5-30%.  The patient reviewed the biopsy results with his urologist and he has kindly been referred today for discussion of potential radiation treatment options.   PREVIOUS RADIATION THERAPY: No  PAST MEDICAL HISTORY:  has a past medical history of Colon polyp; GERD (gastroesophageal reflux disease); Asthma; Pleural effusion (2009); Pericardial effusion (2009); Tendonitis; Mediastinal lymphadenopathy (2009); Sinusitis, chronic; Sleep apnea; Shortness of breath; Oxygen dependent; Hypothyroidism; Prostate cancer (06/09/12 bx); Allergy; and Arthritis.    PAST SURGICAL HISTORY: Past Surgical  History  Procedure Laterality Date  . Mediastinoscopy  2009    neg biopsy  . Nasal sinus surgery    . Prostate biopsy  06/11/12    Adenocarcinoma  . Lung biopsy Left 12/2011    "inflammation"    FAMILY HISTORY: family history includes Cancer in his paternal uncle; Diabetes in his daughter; Heart attack in his father; Nephrolithiasis in his father; and Prostate cancer in his father.  SOCIAL HISTORY:  reports that he quit smoking about 26 years ago. His smoking use included Cigarettes and Cigars. He has a 2 pack-year smoking history. He has never used smokeless tobacco. He reports that he drinks about 1.2 ounces of alcohol per week. He reports that he does not use illicit drugs.  ALLERGIES: Ivp dye and Lactose intolerance (gi)  MEDICATIONS:  Current Outpatient Prescriptions  Medication Sig Dispense Refill  . predniSONE (DELTASONE) 10 MG tablet Take 10 mg by mouth daily.       No current facility-administered medications for this encounter.    REVIEW OF SYSTEMS:  A 15 point review of systems is documented in the electronic medical record. This was obtained by the nursing staff. However, I reviewed this with the patient to discuss relevant findings and make appropriate changes.  A comprehensive review of systems was negative..  The patient completed an IPSS and IIEF questionnaire.  His IPSS score was 2 indicating mild urinary outflow obstructive symptoms.  He indicated that his erectile function is always able to complete sexual activity.   PHYSICAL EXAM: This patient is in no acute distress.  He is alert and oriented.   height  is 6\' 2"  (1.88 m) and weight is 294 lb 1.6 oz (133.403 kg). His oral temperature is 98.4 F (36.9 C). His blood pressure is 112/77 and his pulse is 85. His respiration is 20.  He exhibits no respiratory distress or labored breathing.  He appears neurologically intact.  His mood is pleasant.  His affect is appropriate.  Please note the digital rectal exam findings  described above.  LABORATORY DATA:  Lab Results  Component Value Date   WBC 12.9* 04/12/2012   HGB 13.0 04/12/2012   HCT 39.0 04/12/2012   MCV 84.6 04/12/2012   PLT 176.0 04/12/2012   Lab Results  Component Value Date   NA 136 04/22/2012   K 3.5 04/22/2012   CL 103 04/22/2012   CO2 26 04/22/2012   Lab Results  Component Value Date   ALT 31 01/07/2012   AST 37 01/07/2012   ALKPHOS 59 01/07/2012   BILITOT 0.9 01/07/2012     RADIOGRAPHY: No results found.    IMPRESSION: This gentleman is a 62 y.o. gentleman with stage T1c adenocarcinoma of the prostate with a Gleason's score of 3+4 and a PSA of 5.69.  His T-Stage, Gleason's Score, and PSA put him into the intermediate risk group. His primary Gleason's grade 3 and low-volume involvement would potentially allow the patient to achieve excellent outcomes with prostate brachytherapy as monotherapy. Accordingly he is eligible for a variety of potential treatment options including robotic-assisted laparoscopic radical prostatectomy, external beam radiation therapy with IM RT, or prostate seed implant as monotherapy.  PLAN:Today I reviewed the findings and workup thus far.  We discussed the natural history of prostate cancer.  We reviewed the the implications of T-stage, Gleason's Score, and PSA on decision-making and outcomes in prostate cancer.  We discussed radiation treatment in the management of prostate cancer with regard to the logistics and delivery of external beam radiation treatment as well as the logistics and delivery of prostate brachytherapy.  We compared and contrasted each of these approaches and also compared these against prostatectomy.  The patient expressed interest in prostate brachytherapy.  I filled out a patient counseling form for him with relevant treatment diagrams and we retained a copy for our records.   The patient would like to proceed with prostate brachytherapy, but, would like to delay the procedure until later in the  Summer.  I will share my findings with Dr. Retta Diones.  I did suggest to the patient that he not delay too long (beyond 6 months) with intermediate risk disease and that he be vigilant about monitoring PSA during the delay (perhaps every 3 months).  I enjoyed meeting with him today, and will look forward to participating in the care of this very nice gentleman.    I spent 60 minutes face to face with the patient and more than 50% of that time was spent in counseling and/or coordination of care.   ------------------------------------------------  Artist Pais. Kathrynn Running, M.D.

## 2012-07-27 NOTE — Progress Notes (Signed)
GU Location of Tumor / Histology: Prostate  bx 06/09/12 If Prostate Cancer, Gleason Score is (3 + 3 & 3+4) and PSA is (4.84)  Patient presented Elevated PSA'S May 2013 PSA 5.69, July 2013 PSA 4.84  Biopsies of Prostate   06/09/12 revealed: Adenocarcinoma, 5/12 cores positive, Volume 31cc  Past/Anticipated interventions by urology, if any: Peyronies disease, mild, active surveillance  Past/Anticipated interventions by medical oncology, if any: no Weight changes, if any: no  Bowel/Bladder complaints, if any: IPSS 2  Nausea/Vomiting, if any: no Pain issues, if any: no SAFETY ISSUES:  Prior radiation? no  Pacemaker/ICD? no  Possible current pregnancy? N/A  Is the patient on methotrexate? no  Current Complaints / other details: Father tx for prostatae cancer w/brachytherapy 10-15 yr ago , patient has nocturia x 1, penile curvature. Married, retired from [employer] in 2006, 1 daughter

## 2012-07-28 ENCOUNTER — Ambulatory Visit
Admission: RE | Admit: 2012-07-28 | Discharge: 2012-07-28 | Disposition: A | Discharge status: Home / Self Care | Payer: PRIVATE HEALTH INSURANCE | Source: Ambulatory Visit | Attending: Radiation Oncology | Admitting: Radiation Oncology

## 2012-07-28 ENCOUNTER — Encounter: Payer: Self-pay | Admitting: Radiation Oncology

## 2012-07-28 VITALS — BP 112/77 | HR 85 | Temp 98.4°F | Resp 20 | Ht 74.0 in | Wt 294.1 lb

## 2012-07-28 DIAGNOSIS — C61 Malignant neoplasm of prostate: Secondary | ICD-10-CM

## 2012-07-28 NOTE — Progress Notes (Signed)
Please see the Nurse Progress Note in the MD Initial Consult Encounter for this patient. 

## 2012-08-06 ENCOUNTER — Telehealth: Payer: Self-pay | Admitting: *Deleted

## 2012-08-06 ENCOUNTER — Other Ambulatory Visit: Payer: Self-pay | Admitting: Urology

## 2012-08-06 NOTE — Telephone Encounter (Signed)
CALLED PATIENT TO INFORM OF IMPLANT DATE, LVM FOR A RETURN CALL 

## 2012-08-10 ENCOUNTER — Telehealth: Payer: Self-pay | Admitting: *Deleted

## 2012-08-10 NOTE — Telephone Encounter (Signed)
CALLED PATIENT TO INFORM OF PRE-SEED PLANNING APPT. AND HIS IMPLANT, SPOKE WITH PATIENT AND HE IS AWARE OF THESE PROCEDURES, DAY AND TIMES

## 2012-08-25 ENCOUNTER — Ambulatory Visit (INDEPENDENT_AMBULATORY_CARE_PROVIDER_SITE_OTHER): Payer: PRIVATE HEALTH INSURANCE | Admitting: Pulmonary Disease

## 2012-08-25 ENCOUNTER — Encounter: Payer: Self-pay | Admitting: Pulmonary Disease

## 2012-08-25 VITALS — BP 124/80 | HR 87 | Temp 97.1°F | Ht 73.0 in | Wt 295.4 lb

## 2012-08-25 DIAGNOSIS — R918 Other nonspecific abnormal finding of lung field: Secondary | ICD-10-CM

## 2012-08-25 DIAGNOSIS — G4730 Sleep apnea, unspecified: Secondary | ICD-10-CM

## 2012-08-25 DIAGNOSIS — G4733 Obstructive sleep apnea (adult) (pediatric): Secondary | ICD-10-CM

## 2012-08-25 MED ORDER — PREDNISONE 10 MG PO TABS: 10.0000 mg | ORAL_TABLET | Freq: Every day | ORAL | Status: DC

## 2012-08-25 NOTE — Patient Instructions (Addendum)
Stay on prednisone at 10mg  each day Will get new mask cushions for you, and have [facility]ria re-optimize your cpap pressure. Work on weight loss and conditioning. Would like to see you back in 4mos with chest xray and breathing tests the same day.

## 2012-08-25 NOTE — Addendum Note (Signed)
Addended by: Gweneth Dimitri D on: 08/25/2012 10:09 AM   Modules accepted: Orders

## 2012-08-25 NOTE — Assessment & Plan Note (Signed)
The patient appears to be a reasonable baseline from a pulmonary standpoint, and remains on 10 mg of prednisone a day for his chronic interstitial pneumonia.  He has had significant improvement in his x-rays and clinical status.  I suspect his residual dyspnea on exertion is mostly related to his obesity and deconditioning.  I have asked him to continue on prednisone at his current dose, and to work on weight loss.  Will check pulmonary function studies and chest x-ray at next visit.

## 2012-08-25 NOTE — Progress Notes (Signed)
  Subjective:    Patient ID: Chad [name]., male    DOB: [birth year], 62 y.o.   MRN: [id number]  HPI The patient comes in today for followup of his known pulmonary infiltrate/interstitial lung disease.  He has been diagnosed with chronic interstitial pneumonia by lung biopsy, and has been on chronic prednisone which has been weaned to 10 mg a day.  His last CT chest showed significant improvement, and subjectively he is improved as well.  He comes in today where his breathing is within his normal baseline, but he continues to have good and bad days.  Complicating all of this is his day sitting and deconditioning.  The patient also has a history of sleep apnea which has not been followed, and has not kept up with his mask changes or supplies.  He has also gained significant weight since his sleep study, and has not had his pressure optimized recently.   Review of Systems  Constitutional: Negative for fever and unexpected weight change.  HENT: Positive for congestion, rhinorrhea and postnasal drip. Negative for ear pain, nosebleeds, sore throat, sneezing, trouble swallowing, dental problem and sinus pressure.   Eyes: Negative for redness and itching.  Respiratory: Negative for cough, chest tightness, shortness of breath and wheezing.   Cardiovascular: Negative for palpitations and leg swelling.  Gastrointestinal: Negative for nausea and vomiting.  Genitourinary: Negative for dysuria.  Musculoskeletal: Negative for joint swelling.  Skin: Negative for rash.  Neurological: Negative for headaches.  Hematological: Does not bruise/bleed easily.  Psychiatric/Behavioral: Negative for dysphoric mood. The patient is not nervous/anxious.        Objective:   Physical Exam Morbidly obese male in no acute distress Nose without purulence or discharge noted No skin break and to pressure necrosis from the CPAP mask Neck without lymphadenopathy or thyromegaly Chest with no crackles or wheezes noted,  excellent airflow Cardiac exam is regular rate and rhythm Lower extremities with minimal ankle edema, no cyanosis Alert and oriented, moves all 4 extremities.       Assessment & Plan:

## 2012-08-25 NOTE — Assessment & Plan Note (Signed)
The patient is having a lot of daytime sleepiness and fatigue, but is wearing CPAP compliantly.  He has not been keeping up with his mask changes, and is having a lot of leaks.  At this point, I would like to get his equipment up to speed, and also optimize his pressure again on the automatic setting.

## 2012-08-27 ENCOUNTER — Telehealth: Payer: Self-pay | Admitting: *Deleted

## 2012-08-27 NOTE — Telephone Encounter (Signed)
Called patient to ask question, lvm for a return call 

## 2012-09-03 ENCOUNTER — Telehealth: Payer: Self-pay | Admitting: *Deleted

## 2012-09-03 NOTE — Telephone Encounter (Signed)
Called patient to inform of implant being moved from 12-03-12 to 12-23-12, spoke with patient and he is aware of this change and is good with it.

## 2012-09-23 ENCOUNTER — Encounter: Payer: Self-pay | Admitting: Pulmonary Disease

## 2012-09-23 ENCOUNTER — Telehealth: Payer: Self-pay | Admitting: Pulmonary Disease

## 2012-09-23 MED ORDER — LEVOFLOXACIN 750 MG PO TABS: 750.0000 mg | ORAL_TABLET | Freq: Every day | ORAL | Status: DC

## 2012-09-23 NOTE — Telephone Encounter (Signed)
Spoke with the pt He is c/o prod cough x 4 days with moderate brown sputum  No changes in breathing Had low grade temp 100.2 this am  Wants abx called in  No appts open today Please advise thanks! Allergies  Allergen Reactions  . Ivp Dye (Iodinated Diagnostic Agents) Anaphylaxis    Coma for a day in '09  . Lactose Intolerance (Gi) Other (See Comments)    SX: bloating

## 2012-09-23 NOTE — Telephone Encounter (Signed)
Pt aware of recs. rx called in 

## 2012-09-23 NOTE — Telephone Encounter (Signed)
Ok to call in levaquin 750 one qd for 7 days.  Call us if not getting better.

## 2012-09-27 ENCOUNTER — Other Ambulatory Visit: Payer: Self-pay | Admitting: Adult Health

## 2012-09-29 ENCOUNTER — Other Ambulatory Visit: Payer: Self-pay | Admitting: Pulmonary Disease

## 2012-09-29 NOTE — Telephone Encounter (Signed)
Electronic refill request received for Hycodan cough syrup Rx was given by TP at 10.3.13 acute visit Per 6.26.14 phone note, pt called with acute symptoms: Barbaraann Share, MD at 09/23/2012 1:06 PM     Ok to call in levaquin 750 one qd for 7 days. Call us if not getting better.         Christen Butter, CMA at 09/23/2012 11:28 AM     Spoke with the pt  He is c/o prod cough x 4 days with moderate brown sputum  No changes in breathing  Had low grade temp 100.2 this am  Wants abx called in  No appts open today  Please advise thanks!      Allergies      Allergen  Reactions      .  Ivp Dye (Iodinated Diagnostic Agents)  Anaphylaxis        Coma for a day in '09      .  Lactose Intolerance (Gi)  Other (See Comments)        SX: bloating     Dr Shelle Iron, TP is not in the office today or tomorrow.  May pt have a refill on the hycodan?  Thank you.

## 2012-09-29 NOTE — Telephone Encounter (Signed)
Ok to have 4 ounces only with no fills.

## 2012-09-29 NOTE — Telephone Encounter (Signed)
Please take this message out of my box.  I am not able to.

## 2012-09-29 NOTE — Telephone Encounter (Signed)
Rx has been called in per KC. Nothing further was needed. 

## 2012-10-09 ENCOUNTER — Other Ambulatory Visit: Payer: Self-pay | Admitting: Pulmonary Disease

## 2012-10-09 DIAGNOSIS — G4733 Obstructive sleep apnea (adult) (pediatric): Secondary | ICD-10-CM

## 2012-10-26 ENCOUNTER — Telehealth: Payer: Self-pay | Admitting: *Deleted

## 2012-10-26 NOTE — Telephone Encounter (Signed)
CALLED PATIENT TO INFORM THAT I NEED TO MOVE HIS PRE-SEED APPT. TO 11-02-12 AT 10 AM , DUE TO DR. MANNING BEING ON VACATION,LVM FOR A RETURN CALL

## 2012-10-28 ENCOUNTER — Telehealth: Payer: Self-pay | Admitting: *Deleted

## 2012-10-28 NOTE — Telephone Encounter (Signed)
Called patient to inform that pre-seed planning CT has been moved to August 5 at 1 pm, spoke with patient and he confirmed that he would be able to come for this appt.

## 2012-10-29 ENCOUNTER — Ambulatory Visit: Payer: PRIVATE HEALTH INSURANCE | Admitting: Radiation Oncology

## 2012-11-01 NOTE — Progress Notes (Signed)
  Radiation Oncology         [phone]) [phone] ________________________________  Name: Jonetta Osgood. MRN: [id number]  Date: 11/02/2012  DOB: [birth year]  SIMULATION AND TREATMENT PLANNING NOTE PUBIC ARCH STUDY  WG:[id number] Artist Pais, DO  Yoo, Doe-Hyun R, DO  DIAGNOSIS: 62 y.o. gentleman with stage T1c adenocarcinoma of the prostate with a Gleason's score of 3+4 and a PSA of 6.49  COMPLEX SIMULATION:  The patient presented today for evaluation for possible prostate seed implant. He was brought to the radiation planning suite and placed supine on the CT couch. A 3-dimensional image study set was obtained in upload to the planning computer. There, on each axial slice, I contoured the prostate gland. Then, using three-dimensional radiation planning tools I reconstructed the prostate in view of the structures from the transperineal needle pathway to assess for possible pubic arch interference. In doing so, I did not appreciate any pubic arch interference. Also, the patient's prostate volume was estimated based on the drawn structure. The volume was 29 cc.  Given the pubic arch appearance and prostate volume, patient remains a good candidate to proceed with prostate seed implant. Today, he freely provided informed written consent to proceed.    PLAN: The patient will undergo prostate seed implant.   ________________________________  Artist Pais. Kathrynn Running, M.D.

## 2012-11-02 ENCOUNTER — Ambulatory Visit
Admission: RE | Admit: 2012-11-02 | Discharge: 2012-11-02 | Disposition: A | Discharge status: Home / Self Care | Payer: PRIVATE HEALTH INSURANCE | Source: Ambulatory Visit | Attending: Radiation Oncology | Admitting: Radiation Oncology

## 2012-11-02 DIAGNOSIS — C61 Malignant neoplasm of prostate: Secondary | ICD-10-CM | POA: Insufficient documentation

## 2012-11-05 ENCOUNTER — Other Ambulatory Visit: Payer: Self-pay | Admitting: Radiation Oncology

## 2012-11-29 ENCOUNTER — Encounter: Payer: Self-pay | Admitting: Pulmonary Disease

## 2012-11-30 MED ORDER — PREDNISONE 10 MG PO TABS: 10.0000 mg | ORAL_TABLET | Freq: Every day | ORAL | Status: DC

## 2012-11-30 NOTE — Telephone Encounter (Signed)
Refill sent. Ledell Codrington, CMA  

## 2012-12-14 ENCOUNTER — Encounter (HOSPITAL_BASED_OUTPATIENT_CLINIC_OR_DEPARTMENT_OTHER): Payer: Self-pay | Admitting: *Deleted

## 2012-12-16 ENCOUNTER — Encounter (HOSPITAL_BASED_OUTPATIENT_CLINIC_OR_DEPARTMENT_OTHER): Payer: Self-pay | Admitting: *Deleted

## 2012-12-16 ENCOUNTER — Telehealth: Payer: Self-pay | Admitting: *Deleted

## 2012-12-16 LAB — COMPREHENSIVE METABOLIC PANEL
ALT: 18 U/L (ref 0–53)
AST: 18 U/L (ref 0–37)
Albumin: 3.3 g/dL — ABNORMAL LOW (ref 3.5–5.2)
CO2: 27 mEq/L (ref 19–32)
Chloride: 102 mEq/L (ref 96–112)
Creatinine, Ser: 0.77 mg/dL (ref 0.50–1.35)
Potassium: 3.8 mEq/L (ref 3.5–5.1)
Sodium: 138 mEq/L (ref 135–145)
Total Bilirubin: 0.3 mg/dL (ref 0.3–1.2)

## 2012-12-16 LAB — CBC
MCV: 87.4 fL (ref 78.0–100.0)
Platelets: 170 10*3/uL (ref 150–400)
RBC: 4.54 MIL/uL (ref 4.22–5.81)
RDW: 15.7 % — ABNORMAL HIGH (ref 11.5–15.5)
WBC: 11.6 10*3/uL — ABNORMAL HIGH (ref 4.0–10.5)

## 2012-12-16 LAB — PROTIME-INR: INR: 1.06 (ref 0.00–1.49)

## 2012-12-16 LAB — APTT: aPTT: 27 seconds (ref 24–37)

## 2012-12-16 NOTE — Telephone Encounter (Signed)
Called patient to inform of appt., spoke with patient and he is aware of this appt.

## 2012-12-16 NOTE — Progress Notes (Addendum)
NPO AFTER MN. ARRIVES AT 0600. CURRENT CHEST CT, EKG , AND LAB RESULTS IN EPIC AND CHART. WILL DO FLEET ENEMA AM OF SURG.  WILL BRING CPAP.  REVIEWED CHART W/ DR GERMEROTH MDA INCLUDING LAST MDA NOTE OF DR Noreene Larsson FROM PROCEDURE DONE IN OCT 2013 ABOUT DIFFICULT VISUAL AIRWAY, OK TO PROCEED.

## 2012-12-17 ENCOUNTER — Encounter (HOSPITAL_BASED_OUTPATIENT_CLINIC_OR_DEPARTMENT_OTHER): Payer: Self-pay | Admitting: *Deleted

## 2012-12-22 ENCOUNTER — Telehealth: Payer: Self-pay | Admitting: *Deleted

## 2012-12-22 NOTE — Telephone Encounter (Signed)
CALLED PATIENT TO  REMIND OF PROCEDURE FOR 12-23-12, CONFIRMED PROCEDURE WITH PATIENT.

## 2012-12-22 NOTE — H&P (Signed)
Urology History and Physical Exam  CC: Prostate cancer  HPI: 62 year old male presents for I-125 brachytherapy. His history is below:   Because of an elevated PSA (5.69 in May, 2013 and 4.84 in July, 2013) as well as a family history of prostate cancer and he underwent ultrasound and biopsy of his prostate on 06/09/2012. Prostatic volume was 31 cc. 5/12 cores were positive, as follows:  Right base medial, Gleason 3+4, 20% of core Right base lateral, Gleason 3+3, 30% of core Right mid lateral, Gleason 3+3, 20% of core Left mid lateral, Gleason 3+3, 10% of core Left apex lateral, Gleason 3+3, 5% of core.  HE has decided on brachytherapy as his primary treatment strategy.  PMH: Past Medical History  Diagnosis Date  . Prostate cancer 06/09/12 bx    Adenocarcinoma  . Arthritis   . OSA on CPAP   . Chronic interstitial lung disease     PULMONARY INFILTRATE  --  PULMOLOGIST-  DR CLANCE  . Chronic steroid use     INTERSTITIAL LUNG DISEASE  . Dyspnea on exertion   . History of anal fissures   . History of hypothyroidism   . Complication of anesthesia     EMERGENCE COMBATIVENESS---  PLEASE REFER TO VATS 01-05-1012 PROCEDURE ,  DR Noreene Larsson DOCUMENTED GRADE IV DIFFICULT VISUAL AIRWAY    PSH: Past Surgical History  Procedure Laterality Date  . Mediastinoscopy  07-12-2007    BILATERAL PLEURAL EFFUSIONS  . Nasal sinus surgery    . Prostate biopsy  06/11/12    Adenocarcinoma  . Video assisted thoracoscopy (vats)/ lymph node sampling Left 01-05-2012    LUNG AND LYMPH NODE BX'S (CHRONIC INTERSTITIAL PNEUMONIA)  . Cardiovascular stress test  01-10-2008    NORMAL EXERCISE STRESS TEST AT GOOD WORKLOAD  . Transthoracic echocardiogram  12-31-2011    NORMAL LVF/   EF  55-60%    Allergies: Allergies  Allergen Reactions  . Ivp Dye [Iodinated Diagnostic Agents] Anaphylaxis    Coma for a day in '09  . Lactose Intolerance (Gi) Other (See Comments)    Medications: No prescriptions prior to  admission     Social History: History   Social History  . Marital Status: Married    Spouse Name: N/A    Number of Children: 1  . Years of Education: N/A   Occupational History  . [employer]     part time   Social History Main Topics  . Smoking status: Former Smoker -- 1.00 packs/day for 2 years    Types: Cigarettes, Cigars    Quit date: 04/17/1986  . Smokeless tobacco: Never Used  . Alcohol Use: 1.2 oz/week    2 Cans of beer per week  . Drug Use: No  . Sexual Activity: Not on file   Other Topics Concern  . 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 Heart attack Father   . Prostate cancer Father     seed implant  . Nephrolithiasis Father   . Diabetes Daughter   . Cancer Paternal Uncle     Review of Systems: Positive: N/A Negative: A further 10 point review of systems was negative except what is listed in the HPI.  Physical Exam: @VITALS2 @ General: No acute distress.  Awake. Head:  Normocephalic.  Atraumatic. ENT:  EOMI.  Mucous membranes moist Neck:  Supple.  No lymphadenopathy. CV:  S1 present. S2 present. Regular rate. Pulmonary: Equal effort bilaterally.  Clear to auscultation bilaterally. Abdomen: Soft.  Non tender to palpation. Skin:  Normal turgor.  No visible rash. Extremity: No gross deformity of bilateral upper extremities.  No gross deformity of    bilateral lower extremities. Neurologic: Alert. Appropriate mood.    Studies:  No results found for this basename: HGB, WBC, PLT,  in the last 72 hours  No results found for this basename: NA, K, CL, CO2, BUN, CREATININE, CALCIUM, MAGNESIUM, GFRNONAA, GFRAA,  in the last 72 hours   No results found for this basename: PT, INR, APTT,  in the last 72 hours   No components found with this basename: ABG,      Assessment:  Stage T1c adenocarcinoma of the prostate, gleason 3+4  Plan: I-125 brachytherapy

## 2012-12-22 NOTE — Op Note (Signed)
Preoperative diagnosis: Clinical stage TI C adenocarcinoma the prostate   Postoperative diagnosis: Same   Procedure: I-125 prostate seed implantation with Nucletron robotic implanter   Surgeon: Bertram Millard. Azar South M.D.  Radiation Oncologist:Manning  Anesthesia: Gen.   Indications: Patient  was diagnosed with clinical stage TI C prostate cancer. We had extensive discussion with him about treatment options versus. He elected to proceed with seed implantation. He underwent consultation my office as well as with Dr. Kathrynn Running. He appeared to understand the advantages disadvantages potential risks of this treatment option. Full informed consent has been obtained.   Technique and findings: Patient was brought the operating room where he had successful induction of general anesthesia. He was placed in dorso-lithotomy position and prepped and draped in usual manner. Appropriate surgical timeout was performed. Radiation oncology department placed a transrectal ultrasound probe anchoring stand. Foley catheter with contrast in the balloon was inserted without difficulty. Anchoring needles were placed within the prostate. Rectal tube was placed. Real-time contouring of the urethra prostate and rectum were performed and the dosing parameters were established. Targeted dose was 145 gray.  I was then called  to the operating suite suite for placement of the needles. A second timeout was performed. All needle passage was done with real-time transrectal ultrasound guidance with the sagittal plane. A total of 23 needles were placed. The implantation itself was done with the robotic implanter. 82 active seeds were implanted. A Foley catheter was removed and flexible cystoscopy failed to show any seeds outside the prostate.  The patient was brought to recovery room in stable condition.

## 2012-12-23 ENCOUNTER — Encounter (HOSPITAL_BASED_OUTPATIENT_CLINIC_OR_DEPARTMENT_OTHER): Payer: Self-pay | Admitting: *Deleted

## 2012-12-23 ENCOUNTER — Ambulatory Visit (HOSPITAL_COMMUNITY): Payer: PRIVATE HEALTH INSURANCE

## 2012-12-23 ENCOUNTER — Encounter (HOSPITAL_BASED_OUTPATIENT_CLINIC_OR_DEPARTMENT_OTHER)
Admission: RE | Disposition: A | Discharge status: Home / Self Care | Payer: Self-pay | Source: Ambulatory Visit | Attending: Urology

## 2012-12-23 ENCOUNTER — Ambulatory Visit (HOSPITAL_BASED_OUTPATIENT_CLINIC_OR_DEPARTMENT_OTHER): Payer: PRIVATE HEALTH INSURANCE | Admitting: Anesthesiology

## 2012-12-23 ENCOUNTER — Encounter (HOSPITAL_BASED_OUTPATIENT_CLINIC_OR_DEPARTMENT_OTHER): Payer: Self-pay | Admitting: Anesthesiology

## 2012-12-23 ENCOUNTER — Ambulatory Visit (HOSPITAL_BASED_OUTPATIENT_CLINIC_OR_DEPARTMENT_OTHER)
Admission: RE | Admit: 2012-12-23 | Discharge: 2012-12-23 | Disposition: A | Discharge status: Home / Self Care | Payer: PRIVATE HEALTH INSURANCE | Source: Ambulatory Visit | Attending: Urology | Admitting: Urology

## 2012-12-23 DIAGNOSIS — G4733 Obstructive sleep apnea (adult) (pediatric): Secondary | ICD-10-CM | POA: Insufficient documentation

## 2012-12-23 DIAGNOSIS — C61 Malignant neoplasm of prostate: Secondary | ICD-10-CM | POA: Insufficient documentation

## 2012-12-23 DIAGNOSIS — J8410 Pulmonary fibrosis, unspecified: Secondary | ICD-10-CM | POA: Insufficient documentation

## 2012-12-23 DIAGNOSIS — Z8042 Family history of malignant neoplasm of prostate: Secondary | ICD-10-CM | POA: Insufficient documentation

## 2012-12-23 HISTORY — DX: Dyspnea, unspecified: R06.00

## 2012-12-23 HISTORY — DX: Interstitial pulmonary disease, unspecified: J84.9

## 2012-12-23 HISTORY — DX: Personal history of other endocrine, nutritional and metabolic disease: Z86.39

## 2012-12-23 HISTORY — PX: RADIOACTIVE SEED IMPLANT: SHX5150

## 2012-12-23 HISTORY — DX: Personal history of other diseases of the digestive system: Z87.19

## 2012-12-23 HISTORY — DX: Other forms of dyspnea: R06.09

## 2012-12-23 HISTORY — DX: Other complications of anesthesia, initial encounter: T88.59XA

## 2012-12-23 HISTORY — DX: Reserved for concepts with insufficient information to code with codable children: IMO0002

## 2012-12-23 HISTORY — DX: Adverse effect of unspecified anesthetic, initial encounter: T41.45XA

## 2012-12-23 SURGERY — INSERTION, RADIATION SOURCE, PROSTATE
Anesthesia: General | Site: Prostate | Wound class: Clean Contaminated

## 2012-12-23 MED ORDER — ACETAMINOPHEN 650 MG RE SUPP
650.0000 mg | RECTAL | Status: DC | PRN
  Filled 2012-12-23: qty 1

## 2012-12-23 MED ORDER — FENTANYL CITRATE 0.05 MG/ML IJ SOLN
INTRAMUSCULAR | Status: DC | PRN
Start: 2012-12-23 — End: 2012-12-23
  Administered 2012-12-23: 25 ug via INTRAVENOUS
  Administered 2012-12-23: 50 ug via INTRAVENOUS
  Administered 2012-12-23 (×3): 25 ug via INTRAVENOUS
  Administered 2012-12-23: 50 ug via INTRAVENOUS

## 2012-12-23 MED ORDER — IOHEXOL 350 MG/ML SOLN
INTRAVENOUS | Status: DC | PRN
  Administered 2012-12-23: 7 mL

## 2012-12-23 MED ORDER — HYDROMORPHONE HCL PF 1 MG/ML IJ SOLN
0.2500 mg | INTRAMUSCULAR | Status: DC | PRN
  Administered 2012-12-23: 0.5 mg via INTRAVENOUS
  Filled 2012-12-23: qty 1

## 2012-12-23 MED ORDER — LIDOCAINE HCL (CARDIAC) 20 MG/ML IV SOLN
INTRAVENOUS | Status: DC | PRN
  Administered 2012-12-23: 100 mg via INTRAVENOUS

## 2012-12-23 MED ORDER — CIPROFLOXACIN HCL 250 MG PO TABS: 250.0000 mg | ORAL_TABLET | Freq: Two times a day (BID) | ORAL | Status: DC

## 2012-12-23 MED ORDER — CIPROFLOXACIN IN D5W 400 MG/200ML IV SOLN
400.0000 mg | INTRAVENOUS | Status: AC
  Administered 2012-12-23: 400 mg via INTRAVENOUS
  Filled 2012-12-23: qty 200

## 2012-12-23 MED ORDER — ACETAMINOPHEN 10 MG/ML IV SOLN
INTRAVENOUS | Status: DC | PRN
  Administered 2012-12-23: 1000 mg via INTRAVENOUS

## 2012-12-23 MED ORDER — PROMETHAZINE HCL 25 MG/ML IJ SOLN
6.2500 mg | INTRAMUSCULAR | Status: DC | PRN
  Filled 2012-12-23: qty 1

## 2012-12-23 MED ORDER — OXYCODONE HCL 5 MG PO TABS
5.0000 mg | ORAL_TABLET | Freq: Once | ORAL | Status: AC | PRN
  Administered 2012-12-23: 5 mg via ORAL
  Filled 2012-12-23: qty 1

## 2012-12-23 MED ORDER — OXYCODONE HCL 5 MG/5ML PO SOLN
5.0000 mg | Freq: Once | ORAL | Status: AC | PRN
  Filled 2012-12-23: qty 5

## 2012-12-23 MED ORDER — LACTATED RINGERS IV SOLN
INTRAVENOUS | Status: DC
  Administered 2012-12-23 (×2): via INTRAVENOUS
  Filled 2012-12-23: qty 1000

## 2012-12-23 MED ORDER — SODIUM CHLORIDE 0.9 % IJ SOLN
3.0000 mL | INTRAMUSCULAR | Status: DC | PRN
  Filled 2012-12-23: qty 3

## 2012-12-23 MED ORDER — ACETAMINOPHEN 325 MG PO TABS
650.0000 mg | ORAL_TABLET | ORAL | Status: DC | PRN
  Filled 2012-12-23: qty 2

## 2012-12-23 MED ORDER — PROPOFOL 10 MG/ML IV BOLUS
INTRAVENOUS | Status: DC | PRN
  Administered 2012-12-23: 250 mg via INTRAVENOUS
  Administered 2012-12-23: 50 mg via INTRAVENOUS

## 2012-12-23 MED ORDER — FLEET ENEMA 7-19 GM/118ML RE ENEM
1.0000 | ENEMA | Freq: Once | RECTAL | Status: DC
  Filled 2012-12-23: qty 1

## 2012-12-23 MED ORDER — ONDANSETRON HCL 4 MG/2ML IJ SOLN
4.0000 mg | Freq: Four times a day (QID) | INTRAMUSCULAR | Status: DC | PRN
  Filled 2012-12-23: qty 2

## 2012-12-23 MED ORDER — SODIUM CHLORIDE 0.9 % IJ SOLN
3.0000 mL | Freq: Two times a day (BID) | INTRAMUSCULAR | Status: DC
  Filled 2012-12-23: qty 3

## 2012-12-23 MED ORDER — KETOROLAC TROMETHAMINE 30 MG/ML IJ SOLN
INTRAMUSCULAR | Status: DC | PRN
  Administered 2012-12-23: 30 mg via INTRAVENOUS

## 2012-12-23 MED ORDER — METOCLOPRAMIDE HCL 5 MG/ML IJ SOLN
INTRAMUSCULAR | Status: DC | PRN
  Administered 2012-12-23: 10 mg via INTRAVENOUS

## 2012-12-23 MED ORDER — SODIUM CHLORIDE 0.9 % IV SOLN
250.0000 mL | INTRAVENOUS | Status: DC | PRN
  Filled 2012-12-23: qty 250

## 2012-12-23 MED ORDER — HYDROCODONE-ACETAMINOPHEN 5-325 MG PO TABS: 1.0000 | ORAL_TABLET | ORAL | Status: DC | PRN

## 2012-12-23 MED ORDER — MEPERIDINE HCL 25 MG/ML IJ SOLN
6.2500 mg | INTRAMUSCULAR | Status: DC | PRN
  Filled 2012-12-23: qty 1

## 2012-12-23 MED ORDER — ONDANSETRON HCL 4 MG/2ML IJ SOLN
INTRAMUSCULAR | Status: DC | PRN
  Administered 2012-12-23: 4 mg via INTRAVENOUS

## 2012-12-23 MED ORDER — STERILE WATER FOR IRRIGATION IR SOLN
Status: DC | PRN
  Administered 2012-12-23: 3000 mL

## 2012-12-23 MED ORDER — DEXAMETHASONE SODIUM PHOSPHATE 4 MG/ML IJ SOLN
INTRAMUSCULAR | Status: DC | PRN
  Administered 2012-12-23: 10 mg via INTRAVENOUS

## 2012-12-23 MED ORDER — OXYCODONE HCL 5 MG PO TABS
5.0000 mg | ORAL_TABLET | ORAL | Status: DC | PRN
  Filled 2012-12-23: qty 2

## 2012-12-23 SURGICAL SUPPLY — 24 items
BAG URINE DRAINAGE (UROLOGICAL SUPPLIES) ×2 IMPLANT
BLADE SURG ROTATE 9660 (MISCELLANEOUS) ×2 IMPLANT
CATH FOLEY 2WAY SLVR  5CC 16FR (CATHETERS) ×1
CATH FOLEY 2WAY SLVR 5CC 16FR (CATHETERS) ×1 IMPLANT
CATH ROBINSON RED A/P 20FR (CATHETERS) ×2 IMPLANT
CLOTH BEACON ORANGE TIMEOUT ST (SAFETY) ×2 IMPLANT
COVER MAYO STAND STRL (DRAPES) ×2 IMPLANT
COVER TABLE BACK 60X90 (DRAPES) ×2 IMPLANT
DRSG TEGADERM 4X4.75 (GAUZE/BANDAGES/DRESSINGS) ×2 IMPLANT
DRSG TEGADERM 8X12 (GAUZE/BANDAGES/DRESSINGS) ×2 IMPLANT
GLOVE BIO SURGEON STRL SZ7.5 (GLOVE) IMPLANT
GLOVE BIO SURGEON STRL SZ8 (GLOVE) ×4 IMPLANT
GLOVE BIOGEL PI IND STRL 7.5 (GLOVE) IMPLANT
GLOVE BIOGEL PI INDICATOR 7.5 (GLOVE) ×3
GLOVE ECLIPSE 8.0 STRL XLNG CF (GLOVE) ×4 IMPLANT
GOWN PREVENTION PLUS LG XLONG (DISPOSABLE) ×3 IMPLANT
GOWN STRL REIN XL XLG (GOWN DISPOSABLE) ×2 IMPLANT
HOLDER FOLEY CATH W/STRAP (MISCELLANEOUS) ×2 IMPLANT
PACK CYSTOSCOPY (CUSTOM PROCEDURE TRAY) ×2 IMPLANT
SYRINGE 10CC LL (SYRINGE) ×2 IMPLANT
UNDERPAD 30X30 INCONTINENT (UNDERPADS AND DIAPERS) ×4 IMPLANT
WATER STERILE IRR 3000ML UROMA (IV SOLUTION) ×2 IMPLANT
WATER STERILE IRR 500ML POUR (IV SOLUTION) ×2 IMPLANT
nucletron selectseed ×1 IMPLANT

## 2012-12-23 NOTE — Transfer of Care (Signed)
Immediate Anesthesia Transfer of Care Note  Patient: Chad [name].  Procedure(s) Performed: Procedure(s) (LRB): RADIOACTIVE SEED IMPLANT (N/A)  Patient Location: PACU  Anesthesia Type: General  Level of Consciousness: awake, alert  and oriented  Airway & Oxygen Therapy: Patient Spontanous Breathing and Patient connected to face mask oxygen  Post-op Assessment: Report given to PACU RN and Post -op Vital signs reviewed and stable  Post vital signs: Reviewed and stable  Complications: No apparent anesthesia complications

## 2012-12-23 NOTE — Anesthesia Preprocedure Evaluation (Addendum)
Anesthesia Evaluation  Patient identified by MRN, date of birth, ID band Patient awake    Reviewed: Allergy & Precautions, H&P , NPO status , Patient's Chart, lab work & pertinent test results  History of Anesthesia Complications (+) Emergence Delirium  Airway Mallampati: I TM Distance: >3 FB Neck ROM: Full    Dental  (+) Teeth Intact, Dental Advisory Given and Caps   Pulmonary shortness of breath and with exertion, asthma , sleep apnea and Continuous Positive Airway Pressure Ventilation , former smoker,  - rhonchi  + decreased breath sounds      Cardiovascular negative cardio ROS  Rhythm:Regular Rate:Normal     Neuro/Psych  Neuromuscular disease negative neurological ROS  negative psych ROS   GI/Hepatic Neg liver ROS, GERD-  Controlled,  Endo/Other  Hypothyroidism Morbid obesity  Renal/GU negative Renal ROS     Musculoskeletal negative musculoskeletal ROS (+)   Abdominal (+)  Abdomen: soft.    Peds negative pediatric ROS (+)  Hematology negative hematology ROS (+)   Anesthesia Other Findings   Reproductive/Obstetrics                          Anesthesia Physical  Anesthesia Plan  ASA: III  Anesthesia Plan: General   Post-op Pain Management:    Induction: Intravenous  Airway Management Planned: LMA and Oral ETT  Additional Equipment:   Intra-op Plan:   Post-operative Plan: Extubation in OR  Informed Consent: I have reviewed the patients History and Physical, chart, labs and discussed the procedure including the risks, benefits and alternatives for the proposed anesthesia with the patient or authorized representative who has indicated his/her understanding and acceptance.   Dental advisory given  Plan Discussed with: CRNA  Anesthesia Plan Comments:         Anesthesia Quick Evaluation

## 2012-12-23 NOTE — Anesthesia Postprocedure Evaluation (Signed)
Anesthesia Post Note  Patient: Chad [name].  Procedure(s) Performed: Procedure(s) (LRB): RADIOACTIVE SEED IMPLANT (N/A)  Anesthesia type: General  Patient location: PACU  Post pain: Pain level controlled  Post assessment: Post-op Vital signs reviewed  Last Vitals: BP 138/90  Pulse 77  Temp(Src) 36.4 C (Oral)  Resp 10  Ht 6\' 1"  (1.854 m)  Wt 294 lb (133.358 kg)  BMI 38.8 kg/m2  SpO2 97%  Post vital signs: Reviewed  Level of consciousness: sedated  Complications: No apparent anesthesia complications

## 2012-12-23 NOTE — Procedures (Signed)
  Radiation Oncology         [phone]) [phone] ________________________________  Name: Chad [name]. MRN: [id number]  Date: 12/23/2012  DOB: April 02, 1950       Prostate Seed Implant  WJ:[id number] Artist Pais, DO  No ref. provider found  DIAGNOSIS: 62 y.o. gentleman with stage T1c adenocarcinoma of the prostate with a Gleason's score of 3+4 and a PSA of 6.49  PROCEDURE: Insertion of radioactive I-125 seeds into the prostate gland.  RADIATION DOSE: 145 Gy, definitive therapy.  TECHNIQUE: Chad [name]. was brought to the operating room with the urologist. He was placed in the dorsolithotomy position. He was catheterized and a rectal tube was inserted. The perineum was shaved, prepped and draped. The ultrasound probe was then introduced into the rectum to see the prostate gland.  TREATMENT DEVICE: A needle grid was attached to the ultrasound probe stand and anchor needles were placed.  3D PLANNING: The prostate was imaged in 3D using a sagittal sweep of the prostate probe. These images were transferred to the planning computer. There, the prostate, urethra and rectum were defined on each axial reconstructed image. Then, the software created an optimized plan and a few seed positions were adjusted. The quality of the plan was evaluated in terms of dose volume histograms and isodose reconstructions.  Then the accepted plan was uploaded to the seed Selectron afterloading unit.  SPECIAL TREATMENT PROCEDURE/SUPERVISION AND HANDLING: The Nucletron FIRST system was used to place the needles under sagittal guidance. A total of 25 needles were used to deposit 82 seeds in the prostate gland. The individual seed activity was 0.364 mCi for a total implant activity of 29.848 mCi.  COMPLEX SIMULATION: At the end of the procedure, an anterior radiograph of the pelvis was obtained to document seed positioning and count. Cystoscopy was performed to check the urethra and bladder.  MICRODOSIMETRY: At the end of the  procedure, the patient was emitting 0.04 mrem/hr at 1 meter. Accordingly, he was considered safe for hospital discharge.  PLAN: The patient will return to the radiation oncology clinic for post implant CT dosimetry in three weeks.   ________________________________  Artist Pais Kathrynn Running, M.D.

## 2012-12-23 NOTE — Anesthesia Procedure Notes (Signed)
Procedure Name: LMA Insertion Date/Time: 12/23/2012 7:53 AM Performed by: Norva Pavlov Pre-anesthesia Checklist: Patient identified, Emergency Drugs available, Suction available and Patient being monitored Patient Re-evaluated:Patient Re-evaluated prior to inductionOxygen Delivery Method: Circle System Utilized Preoxygenation: Pre-oxygenation with 100% oxygen Intubation Type: IV induction Ventilation: Mask ventilation without difficulty LMA: LMA inserted LMA Size: 5.0 Number of attempts: 1 Airway Equipment and Method: bite block Placement Confirmation: positive ETCO2 Tube secured with: Tape Dental Injury: Teeth and Oropharynx as per pre-operative assessment

## 2012-12-24 ENCOUNTER — Encounter (HOSPITAL_BASED_OUTPATIENT_CLINIC_OR_DEPARTMENT_OTHER): Payer: Self-pay | Admitting: Urology

## 2012-12-28 ENCOUNTER — Ambulatory Visit (INDEPENDENT_AMBULATORY_CARE_PROVIDER_SITE_OTHER)
Admission: RE | Admit: 2012-12-28 | Discharge: 2012-12-28 | Disposition: A | Discharge status: Home / Self Care | Payer: PRIVATE HEALTH INSURANCE | Source: Ambulatory Visit | Attending: Pulmonary Disease | Admitting: Pulmonary Disease

## 2012-12-28 ENCOUNTER — Encounter: Payer: Self-pay | Admitting: Pulmonary Disease

## 2012-12-28 ENCOUNTER — Ambulatory Visit (INDEPENDENT_AMBULATORY_CARE_PROVIDER_SITE_OTHER): Payer: PRIVATE HEALTH INSURANCE | Admitting: Pulmonary Disease

## 2012-12-28 VITALS — BP 132/80 | HR 84 | Temp 97.9°F | Ht 73.0 in | Wt 300.0 lb

## 2012-12-28 DIAGNOSIS — R918 Other nonspecific abnormal finding of lung field: Secondary | ICD-10-CM

## 2012-12-28 DIAGNOSIS — Z23 Encounter for immunization: Secondary | ICD-10-CM

## 2012-12-28 DIAGNOSIS — G4733 Obstructive sleep apnea (adult) (pediatric): Secondary | ICD-10-CM

## 2012-12-28 MED ORDER — PREDNISONE 5 MG PO TABS: 5.0000 mg | ORAL_TABLET | Freq: Every day | ORAL | Status: DC

## 2012-12-28 NOTE — Patient Instructions (Addendum)
Will have my nurse get your download on the auto setting so I can adjust your pressure. Will give you the flu shot today Refer to pulmonary rehab and nutrition at [facility]. Work on [employer] loss and conditioning program.  Will schedule for breathing studies asap.  Will call you with results. Decrease prednisone to 7.5mg  each day for one week, then 5mg  each day for one week, then 2.5mg  each day for one week, then discontinue.  Put this on a calendar so you can keep up with the doses each day.  Please call if you see worsening shortness of breathing during this weaning process.  followup with me in 3mos

## 2012-12-28 NOTE — Addendum Note (Signed)
Addended by: Maisie Fus on: 12/28/2012 11:12 AM   Modules accepted: Orders

## 2012-12-28 NOTE — Progress Notes (Signed)
  Subjective:    Patient ID: Chad [name]., male    DOB: [birth year], 62 y.o.   MRN: [id number]  HPI Patient comes in today for followup of his known interstitial lung disease, with chronic interstitial pneumonia or lung biopsy.  He also has obstructive sleep apnea for which he is on CPAP.  He has been maintained on chronic prednisone at 10 mg a day, and feels that his breathing has been up and down.  He certainly hasn't seen any major worsening.  He has no significant cough or mucus production.  He is trying to work on weight loss, but is not participating in any consistent exercise program.  He had a chest x-ray today that shows no acute change, but unfortunately never got scheduled for his PFTs.  He is wearing CPAP religiously, however his home care company never got his download on the automatic settings.  He is having no issues with his mask fit or pressure.   Review of Systems  Constitutional: Negative for fever and unexpected weight change.  HENT: Negative for ear pain, nosebleeds, congestion, sore throat, rhinorrhea, sneezing, trouble swallowing, dental problem, postnasal drip and sinus pressure.   Eyes: Negative for redness and itching.  Respiratory: Negative for cough, chest tightness, shortness of breath and wheezing.   Cardiovascular: Negative for palpitations and leg swelling.  Gastrointestinal: Negative for nausea and vomiting.  Genitourinary: Negative for dysuria.  Musculoskeletal: Negative for joint swelling.  Skin: Negative for rash.  Neurological: Negative for headaches.  Hematological: Does not bruise/bleed easily.  Psychiatric/Behavioral: Negative for dysphoric mood. The patient is not nervous/anxious.        Objective:   Physical Exam Obese male in no acute distress Nose without purulence or discharge noted No skin breakdown or pressure necrosis from the CPAP mask Neck without lymphadenopathy or thyromegaly Chest with a few crackles in the bases, otherwise  clear Cardiac exam with regular rate and rhythm Lower extremities with mild edema, cyanosis Alert and oriented, moves all 4 extremities.       Assessment & Plan:

## 2012-12-28 NOTE — Assessment & Plan Note (Signed)
The patient has been stable overall from a pulmonary standpoint, but still has chronic dyspnea on exertion.  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  The goal will be to keep the inflammatory process in his lung well-controlled.  He has been on steroids for almost a year now, and I think it is time to try and wean him off of this, and monitor his pulmonary symptoms closely.  He is to let me if he has worsening of his breathing symptoms.

## 2012-12-28 NOTE — Assessment & Plan Note (Signed)
The patient's home care company never got his download off the automatic settings, and we will do this today in order to optimize his pressure.  I've encouraged him to keep up with his supplies and mask cushion changes, and to work aggressively on weight loss.

## 2012-12-30 ENCOUNTER — Telehealth: Payer: Self-pay | Admitting: Pulmonary Disease

## 2012-12-30 ENCOUNTER — Ambulatory Visit (INDEPENDENT_AMBULATORY_CARE_PROVIDER_SITE_OTHER): Payer: PRIVATE HEALTH INSURANCE | Admitting: Pulmonary Disease

## 2012-12-30 DIAGNOSIS — J45909 Unspecified asthma, uncomplicated: Secondary | ICD-10-CM

## 2012-12-30 LAB — PULMONARY FUNCTION TEST

## 2012-12-30 NOTE — Telephone Encounter (Signed)
Spoke with Christoper Allegra-- D/L requested. Will be faxed to Triage fax#

## 2012-12-30 NOTE — Progress Notes (Signed)
PFT done today. 

## 2012-12-30 NOTE — Telephone Encounter (Addendum)
Spoke with Christoper Allegra-- RT's are out of office today for a few hours today and will be made aware of this request upon their return.

## 2013-01-03 ENCOUNTER — Telehealth: Payer: Self-pay | Admitting: Pulmonary Disease

## 2013-01-03 ENCOUNTER — Encounter: Payer: Self-pay | Admitting: Pulmonary Disease

## 2013-01-03 NOTE — Telephone Encounter (Signed)
Let the pt know that his breathing studies show a restriction of his lung capacity that is due to his fibrosis and weight.  There is no obstruction like what we would see in copd or asthma.  Now we have something to compare to once he gets off steroids.

## 2013-01-04 NOTE — Telephone Encounter (Signed)
Results have been explained to patient, pt expressed understanding. Nothing further needed.  

## 2013-01-05 ENCOUNTER — Telehealth: Payer: Self-pay | Admitting: *Deleted

## 2013-01-05 NOTE — Telephone Encounter (Signed)
CALLED PATIENT TO REMIND OF APPTS. FOR 01-06-13, SPOKE WITH PATIENT AND HE IS AWARE OF THESE APPTS.

## 2013-01-06 ENCOUNTER — Ambulatory Visit
Admission: RE | Admit: 2013-01-06 | Discharge: 2013-01-06 | Disposition: A | Discharge status: Home / Self Care | Payer: PRIVATE HEALTH INSURANCE | Source: Ambulatory Visit | Attending: Radiation Oncology | Admitting: Radiation Oncology

## 2013-01-06 VITALS — BP 119/77 | HR 94 | Temp 98.4°F | Ht 73.0 in | Wt 297.2 lb

## 2013-01-06 DIAGNOSIS — C61 Malignant neoplasm of prostate: Secondary | ICD-10-CM

## 2013-01-06 NOTE — Progress Notes (Signed)
Chad [name] here for post seed follow up.  He denies pain.  His IPSS score today is 11.  He has noticed urinary frequency and hesitancy.  He has occasional blood in his urine that he says is usually in the first few drops of the stream.  He has also had an urge to urinate and have a bowel movement at the same time.  He gets up 3-4 times per night to urinate.

## 2013-01-06 NOTE — Progress Notes (Signed)
  Radiation Oncology         [phone]) [phone] ________________________________  Name: Chad [name]. MRN: [id number]  Date: 01/06/2013  DOB: Sep 18, 1950  COMPLEX SIMULATION NOTE  NARRATIVE:  The patient was brought to the CT Simulation planning suite today following prostate seed implantation approximately one month ago.  Identity was confirmed.  All relevant records and images related to the planned course of therapy were reviewed.  Then, the patient was set-up supine.  CT images were obtained.  The CT images were loaded into the planning software.  Then the prostate and rectum were contoured.  Treatment planning then occurred.  The implanted iodine 125 seeds were identified by the physics staff for projection of radiation distribution  I have requested : 3D Simulation  I have requested a DVH of the following structures: Prostate and rectum.    ________________________________  Artist Pais Kathrynn Running, M.D.

## 2013-01-07 ENCOUNTER — Telehealth: Payer: Self-pay | Admitting: *Deleted

## 2013-01-07 ENCOUNTER — Encounter: Payer: Self-pay | Admitting: Radiation Oncology

## 2013-01-07 NOTE — Progress Notes (Signed)
  Radiation Oncology         [phone]) [phone] ________________________________  Name: Chad [name]. MRN: [id number]  Date: 01/06/2013  DOB: [birth year]  Follow-Up Visit Note  CC: Thomos Lemons, DO  Marcine Matar, MD  Diagnosis:   62 y.o. gentleman with stage T1c adenocarcinoma of the prostate with a Gleason's score of 3+4 and a PSA of 6.49  Interval Since Last Radiation:  3  weeks  Narrative:  The patient returns today for routine follow-up.  He is complaining of increased urinary frequency and urinary hesitation symptoms. He filled out a questionnaire regarding urinary function today providing and overall IPSS score of 11 characterizing his symptoms as moderate.  His pre-implant score was 2. He denies any bowel symptoms.  ALLERGIES:  is allergic to ivp dye and lactose intolerance (gi).  Meds: Current Outpatient Prescriptions  Medication Sig Dispense Refill  . Ibuprofen (ADVIL) 200 MG CAPS Take by mouth as needed.      . predniSONE (DELTASONE) 5 MG tablet Take 1 tablet (5 mg total) by mouth daily.  30 tablet  0  . ciprofloxacin (CIPRO) 250 MG tablet Take 1 tablet (250 mg total) by mouth 2 (two) times daily.  10 tablet  0  . HYDROcodone-acetaminophen (NORCO) 5-325 MG per tablet Take 1-2 tablets by mouth every 4 (four) hours as needed for pain.  20 tablet  0   No current facility-administered medications for this encounter.    Physical Findings: The patient is in no acute distress. Patient is alert and oriented.  height is 6\' 1"  (1.854 m) and weight is 297 lb 3.2 oz (134.809 kg). His temperature is 98.4 F (36.9 C). His blood pressure is 119/77 and his pulse is 94. .  No significant changes.  Lab Findings: Lab Results  Component Value Date   WBC 11.6* 12/16/2012   HGB 13.8 12/16/2012   HCT 39.7 12/16/2012   MCV 87.4 12/16/2012   PLT 170 12/16/2012    Radiographic Findings:  Patient underwent CT imaging in our clinic for post implant dosimetry. The CT appears to demonstrate an  adequate distribution of radioactive seeds throughout the prostate gland. There no seeds in her near the rectum. I suspect the final radiation plan and dosimetry will show appropriate coverage of the prostate gland.   Impression: The patient is recovering from the effects of radiation. His urinary symptoms should gradually improve over the next 4-6 months. We talked about this today. He is encouraged by his improvement already and is otherwise please with his outcome.   Plan: Today, I spent time talking to the patient about his prostate seed implant and resolving urinary symptoms. We also talked about long-term follow-up for prostate cancer following seed implant. He understands that ongoing PSA determinations and digital rectal exams will help perform surveillance to rule out disease recurrence. He understands what to expect with his PSA measures. Patient was also educated today about some of the long-term effects from radiation including a small risk for rectal bleeding and possibly erectile dysfunction. We talked about some of the general management approaches to these potential complications. However, I did encourage the patient to contact our office or return at any point if he has questions or concerns related to his previous radiation and prostate cancer.  _____________________________________  Artist Pais. Kathrynn Running, M.D.

## 2013-01-07 NOTE — Telephone Encounter (Signed)
AUTO set Download received from Christoper Allegra has been placed in Chad [name] folder.  Pt awaiting pressure to be changed.

## 2013-01-10 ENCOUNTER — Telehealth: Payer: Self-pay | Admitting: Pulmonary Disease

## 2013-01-10 NOTE — Telephone Encounter (Signed)
Please let pt know that I did receive his download on cpap recently, and he appears to be doing well with the device.  He is currently on auto setting, and we can leave there if he prefers.  Other option is to put him on a fixed pressure of 11cm.   Let me know what he decides.

## 2013-01-11 NOTE — Telephone Encounter (Signed)
Noted  

## 2013-01-11 NOTE — Telephone Encounter (Signed)
Spoke with patient-- Pt would like to stay on AUTO setting for now. Pt feels that this works well with his tolerance level.

## 2013-01-27 ENCOUNTER — Ambulatory Visit: Payer: PRIVATE HEALTH INSURANCE | Admitting: Dietician

## 2013-01-28 ENCOUNTER — Encounter: Payer: Self-pay | Admitting: Pulmonary Disease

## 2013-02-02 ENCOUNTER — Ambulatory Visit (HOSPITAL_COMMUNITY): Payer: PRIVATE HEALTH INSURANCE

## 2013-02-02 ENCOUNTER — Ambulatory Visit: Payer: PRIVATE HEALTH INSURANCE | Admitting: *Deleted

## 2013-02-07 ENCOUNTER — Telehealth: Payer: Self-pay | Admitting: Pulmonary Disease

## 2013-02-07 NOTE — Telephone Encounter (Signed)
Spoke to [email or web address]  she said no precert required pt is aware of this Tobe Sos

## 2013-02-07 NOTE — Telephone Encounter (Signed)
I spoke with pt. He reports he has an appt with pulm rehab wed. He was told by his insurance this will need a pre cert. He reports if this can;t be done before wed to let him know. Please advise PCC's thanks

## 2013-02-09 ENCOUNTER — Encounter (HOSPITAL_COMMUNITY): Payer: Self-pay

## 2013-02-09 ENCOUNTER — Encounter (HOSPITAL_COMMUNITY)
Admission: RE | Admit: 2013-02-09 | Discharge: 2013-02-09 | Disposition: A | Discharge status: Home / Self Care | Payer: PRIVATE HEALTH INSURANCE | Source: Ambulatory Visit | Attending: Pulmonary Disease | Admitting: Pulmonary Disease

## 2013-02-09 DIAGNOSIS — Z5189 Encounter for other specified aftercare: Secondary | ICD-10-CM | POA: Insufficient documentation

## 2013-02-09 DIAGNOSIS — J45909 Unspecified asthma, uncomplicated: Secondary | ICD-10-CM | POA: Insufficient documentation

## 2013-02-09 DIAGNOSIS — J8410 Pulmonary fibrosis, unspecified: Secondary | ICD-10-CM | POA: Insufficient documentation

## 2013-02-09 NOTE — Progress Notes (Signed)
Mr. Campi is here today for orientation to Pulmonary Rehab.  Well dressed obese gentleman accompanied by his wife.  Color good, no difficulty breathing while at rest, skin warm and dry.  Very pleasant, oriented to time and place.  Denies any pain or discomfort at this time . Nurse assesment done, balance good, he does have some shoulder and knee pain intermittantly.  Has not been seen by MD for this.  Heart rate is regular, breath sounds clear throughout. Mild distal peripheral edema, +1 bilateral.  Faint pedal pulses.  Demonstration and practice of PLB using pulse oximeter.  Patient able to return demonstration satisfactorily. Safety and hand hygiene in the exercise area reviewed with patient.  Patient voices understanding.  Department expectations reviewed  and obtainable goals set by patient. He plans to return for 6 min walk test on 02/10/13 and to begin exercise on 02/15/13.  We look forward to helping this nice gentleman.

## 2013-02-10 ENCOUNTER — Encounter (HOSPITAL_COMMUNITY)
Admission: RE | Admit: 2013-02-10 | Discharge: 2013-02-10 | Disposition: A | Discharge status: Home / Self Care | Payer: PRIVATE HEALTH INSURANCE | Source: Ambulatory Visit | Attending: Pulmonary Disease | Admitting: Pulmonary Disease

## 2013-02-11 ENCOUNTER — Encounter (HOSPITAL_COMMUNITY): Payer: Self-pay

## 2013-02-11 NOTE — Progress Notes (Signed)
Chad [name] completed a on 02/10/13.  Patient walked 1,400 feet in 6 minutes on room air with no rest breaks.  The highest heart rate was 152 bpm and the lowest oxygen saturation was 96%.  The patients highest blood pressure was 140/80.  The patient stated that overall fatigue hindered his walk test.

## 2013-02-14 ENCOUNTER — Encounter: Payer: Self-pay | Admitting: Radiation Oncology

## 2013-02-14 ENCOUNTER — Ambulatory Visit
Admission: RE | Admit: 2013-02-14 | Discharge: 2013-02-14 | Disposition: A | Discharge status: Home / Self Care | Payer: PRIVATE HEALTH INSURANCE | Source: Ambulatory Visit | Attending: Radiation Oncology | Admitting: Radiation Oncology

## 2013-02-14 DIAGNOSIS — C61 Malignant neoplasm of prostate: Secondary | ICD-10-CM | POA: Insufficient documentation

## 2013-02-15 ENCOUNTER — Encounter (HOSPITAL_COMMUNITY)
Admission: RE | Admit: 2013-02-15 | Discharge: 2013-02-15 | Disposition: A | Discharge status: Home / Self Care | Payer: PRIVATE HEALTH INSURANCE | Source: Ambulatory Visit | Attending: Pulmonary Disease | Admitting: Pulmonary Disease

## 2013-02-16 ENCOUNTER — Encounter: Payer: Self-pay | Admitting: Radiation Oncology

## 2013-02-16 NOTE — Progress Notes (Signed)
Radiation Oncology         [phone]) [phone] ________________________________  Name: Jonetta Osgood. MRN: [id number]  Date: 02/14/2013  DOB: Mar 29, 1951  3-D Planning Note Prostate Brachytherapy  Diagnosis: 62 y.o. gentleman with stage T1c adenocarcinoma of the prostate with a Gleason's score of 3+4 and a PSA of 6.49  Narrative: On a previous date, Jceon Alverio. returned following prostate seed implantation for post implant planning. He underwent CT scan complex simulation to delineate the three-dimensional structures of the pelvis and demonstrate the radiation distribution.  Since that time, the seed localization, and 3D planning with dose volume histograms have now been completed.  Results:   Prostate Coverage - The dose of radiation delivered to the 90% or more of the prostate gland (D90) was 114.56% of the prescription dose. This exceeds our goal of greater than 90%. Rectal Sparing - The volume of rectal tissue receiving the prescription dose or higher was 0.0 cc. This falls under our thresholds tolerance of 1.0 cc.  Impression: The prostate seed implant appears to show adequate target coverage and appropriate rectal sparing.  Plan:  The patient will continue to follow with urology for ongoing PSA determinations. I would anticipate a high likelihood for local tumor control with minimal risk for rectal morbidity.   ------------------------------------------------  Artist Pais. Kathrynn Running, M.D.

## 2013-02-16 NOTE — Progress Notes (Signed)
  Radiation Oncology         [phone]) [phone] ________________________________  Name: Chad [name]. MRN: [id number]  Date: 02/16/2013  DOB: 08/06/1950  3-D Planning Note Prostate Brachytherapy  Diagnosis: 62 y.o. gentleman with stage T1c adenocarcinoma of the prostate with a Gleason's score of 3+4 and a PSA of 6.49  Narrative: On a previous date, Chad Ehly. returned following prostate seed implantation for post implant planning. He underwent CT scan complex simulation to delineate the three-dimensional structures of the pelvis and demonstrate the radiation distribution.  Since that time, the seed localization, and 3D planning with dose volume histograms have now been completed.  Results:   Prostate Coverage - The dose of radiation delivered to the 90% or more of the prostate gland (D90) was 114.56% of the prescription dose. This exceeds our goal of greater than 90%. Rectal Sparing - The volume of rectal tissue receiving the prescription dose or higher was 0.0 cc. This falls under our thresholds tolerance of 1.0 cc.  Impression: The prostate seed implant appears to show adequate target coverage and appropriate rectal sparing.  Plan:  The patient will continue to follow with urology for ongoing PSA determinations. I would anticipate a high likelihood for local tumor control with minimal risk for rectal morbidity.  ________________________________  Artist Pais Kathrynn Running, M.D.

## 2013-02-17 ENCOUNTER — Encounter (HOSPITAL_COMMUNITY)
Admission: RE | Admit: 2013-02-17 | Discharge: 2013-02-17 | Disposition: A | Discharge status: Home / Self Care | Payer: PRIVATE HEALTH INSURANCE | Source: Ambulatory Visit | Attending: Pulmonary Disease | Admitting: Pulmonary Disease

## 2013-02-22 ENCOUNTER — Encounter (HOSPITAL_COMMUNITY)
Admission: RE | Admit: 2013-02-22 | Discharge: 2013-02-22 | Disposition: A | Discharge status: Home / Self Care | Payer: PRIVATE HEALTH INSURANCE | Source: Ambulatory Visit | Attending: Pulmonary Disease | Admitting: Pulmonary Disease

## 2013-02-24 ENCOUNTER — Encounter (HOSPITAL_COMMUNITY): Payer: PRIVATE HEALTH INSURANCE

## 2013-03-01 ENCOUNTER — Encounter (HOSPITAL_COMMUNITY)
Admission: RE | Admit: 2013-03-01 | Discharge: 2013-03-01 | Disposition: A | Discharge status: Home / Self Care | Payer: PRIVATE HEALTH INSURANCE | Source: Ambulatory Visit | Attending: Pulmonary Disease | Admitting: Pulmonary Disease

## 2013-03-01 DIAGNOSIS — J8410 Pulmonary fibrosis, unspecified: Secondary | ICD-10-CM | POA: Insufficient documentation

## 2013-03-01 DIAGNOSIS — Z5189 Encounter for other specified aftercare: Secondary | ICD-10-CM | POA: Insufficient documentation

## 2013-03-01 DIAGNOSIS — J45909 Unspecified asthma, uncomplicated: Secondary | ICD-10-CM | POA: Insufficient documentation

## 2013-03-03 ENCOUNTER — Encounter (HOSPITAL_COMMUNITY)
Admission: RE | Admit: 2013-03-03 | Discharge: 2013-03-03 | Disposition: A | Discharge status: Home / Self Care | Payer: PRIVATE HEALTH INSURANCE | Source: Ambulatory Visit | Attending: Pulmonary Disease | Admitting: Pulmonary Disease

## 2013-03-08 ENCOUNTER — Encounter (HOSPITAL_COMMUNITY): Payer: Self-pay

## 2013-03-08 ENCOUNTER — Encounter (HOSPITAL_COMMUNITY)
Admission: RE | Admit: 2013-03-08 | Discharge: 2013-03-08 | Disposition: A | Discharge status: Home / Self Care | Payer: PRIVATE HEALTH INSURANCE | Source: Ambulatory Visit | Attending: Pulmonary Disease | Admitting: Pulmonary Disease

## 2013-03-08 NOTE — Progress Notes (Signed)
I have reviewed a Home Exercise Prescription with the [facility] . Eiden is not currently exercising at home.  The patient was advised to walk 3 days a week for 25 minutes.  [place] and I discussed how to progress their exercise prescription.  The patient stated that their goals were to lose weight, improve breathing, and feel better overall.  The patient stated that they understand the exercise prescription.  We reviewed exercise guidelines, target heart rate during exercise, oxygen use, weather, home pulse oximeter, endpoints for exercise, and goals.  Patient is encouraged to come to me with any questions. I will continue to follow up with the patient to assist them with progression and safety.

## 2013-03-10 ENCOUNTER — Encounter (HOSPITAL_COMMUNITY)
Admission: RE | Admit: 2013-03-10 | Discharge: 2013-03-10 | Disposition: A | Discharge status: Home / Self Care | Payer: PRIVATE HEALTH INSURANCE | Source: Ambulatory Visit | Attending: Pulmonary Disease | Admitting: Pulmonary Disease

## 2013-03-10 NOTE — Progress Notes (Signed)
Chad [name]. 62 y.o. male Nutrition Note Spoke with pt. Pt is obese. Pt wants to lose wt. Pt reports wt loss "isn't going very well." Per discussion, pt cutting back frequency and amount of eating for a few days and then "I eat a lot the next few days." Wt loss tips discussed. There are some ways the pt can make his eating habits healthier.  Pt's Rate Your Plate results reviewed with pt.  Pt expressed understanding. Pt avoids some salty foods; eats out 4 meals/week. Healthier ways to eat out discussed. Pt adds salt to food.  The role of sodium in lung disease reviewed with pt.  Pt is diabetic according to EMR. Pt denies DM diagnosis. Pt was taking prednisone "for about a year" and has been off of prednisone "4-5 months." Pt currently not taking DM medication. Diabetes discussed. Pt encouraged to talk with his PCP re: DM. Pt reports he has his annual appointment "coming up soon" and pt plans to discuss DM with his PCP at that time.   Nutrition Diagnosis   Excessive sodium intake related to over consumption of processed food as evidenced by pt eating out frequently.   Food-and nutrition-related knowledge deficit related to lack of exposure to information as related to diagnosis of pulmonary disease   Obesity related to excessive energy intake as evidenced by a BMI of 39.0  Nutrition Rx/Est. Daily Nutrition Needs for: ? wt loss 2100-2600 Kcal  110-135 gm protein   2000 mg or less sodium      Nutrition Intervention   Pt's individual nutrition plan and goals reviewed with pt.   Benefits of adopting healthy eating habits discussed when pt's Rate Your Plate reviewed.   Handout given for 2200 kcal, 5-day menu ideas   Pt to attend the Nutrition and Lung Disease class   Continual client-centered nutrition education by RD, as part of interdisciplinary care. Goal(s) 1. Pt to identify and limit food sources of sodium. 2. Identify food quantities necessary to achieve wt loss of  -2# per week to a goal wt  of 276-294 lb (125.8-134 kg) at graduation from pulmonary rehab. 3. Describe the benefit of including fruits, vegetables, whole grains, and low-fat dairy products in a healthy meal plan. Monitor and Evaluate progress toward nutrition goal with team.   Mickle Plumb, M.Ed, RD, LDN, CDE 03/10/2013 12:15 PM

## 2013-03-15 ENCOUNTER — Encounter (HOSPITAL_COMMUNITY)
Admission: RE | Admit: 2013-03-15 | Discharge: 2013-03-15 | Disposition: A | Discharge status: Home / Self Care | Payer: PRIVATE HEALTH INSURANCE | Source: Ambulatory Visit | Attending: Pulmonary Disease | Admitting: Pulmonary Disease

## 2013-03-17 ENCOUNTER — Encounter (HOSPITAL_COMMUNITY)
Admission: RE | Admit: 2013-03-17 | Discharge: 2013-03-17 | Disposition: A | Discharge status: Home / Self Care | Payer: PRIVATE HEALTH INSURANCE | Source: Ambulatory Visit | Attending: Pulmonary Disease | Admitting: Pulmonary Disease

## 2013-03-21 ENCOUNTER — Encounter: Payer: Self-pay | Admitting: Pulmonary Disease

## 2013-03-21 ENCOUNTER — Ambulatory Visit (INDEPENDENT_AMBULATORY_CARE_PROVIDER_SITE_OTHER): Payer: PRIVATE HEALTH INSURANCE | Admitting: Pulmonary Disease

## 2013-03-21 VITALS — BP 116/86 | HR 91 | Temp 98.4°F | Ht 74.0 in | Wt 294.4 lb

## 2013-03-21 DIAGNOSIS — R918 Other nonspecific abnormal finding of lung field: Secondary | ICD-10-CM

## 2013-03-21 DIAGNOSIS — G4733 Obstructive sleep apnea (adult) (pediatric): Secondary | ICD-10-CM

## 2013-03-21 NOTE — Assessment & Plan Note (Signed)
The patient is doing well off prednisone from a breathing standpoint. He is also participating in pulmonary rehabilitation, and is working on weight loss. I've asked him to stay off prednisone, and will see how he does over time. He understands that he can have a recurrence of his inflammatory process, and will let us know if he has increased symptoms.

## 2013-03-21 NOTE — Patient Instructions (Signed)
Continue with exercise program and weight loss Stay on cpap followup with me in 4mos, but call if having issues with your breathing

## 2013-03-21 NOTE — Assessment & Plan Note (Signed)
The pt is doing well with cpap on the auto setting.

## 2013-03-21 NOTE — Progress Notes (Signed)
   Subjective:    Patient ID: Chad [name]., male    DOB: 1950-09-24, 62 y.o.   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 The patient has been treated with prednisone with an excellent response, and has recently been tapered completely off steroids. He feels that his breathing has remained stable, and he is getting stronger from participating in pulmonary rehabilitation. He has had a recent viral upper respiratory infection which is slowly resolving. He is continuing on CPAP on the automatic setting.   Review of Systems  Constitutional: Negative for fever and unexpected weight change.  HENT: Positive for congestion, postnasal drip, sinus pressure and sneezing. Negative for dental problem, ear pain, nosebleeds, rhinorrhea, sore throat and trouble swallowing.   Eyes: Negative for redness and itching.  Respiratory: Positive for cough and wheezing. Negative for chest tightness and shortness of breath.   Cardiovascular: Negative for palpitations and leg swelling.  Gastrointestinal: Negative for nausea and vomiting.  Genitourinary: Negative for dysuria.  Musculoskeletal: Negative for joint swelling.  Skin: Negative for rash.  Neurological: Negative for headaches.  Hematological: Does not bruise/bleed easily.  Psychiatric/Behavioral: Negative for dysphoric mood. The patient is not nervous/anxious.        Objective:   Physical Exam Overweight male in no acute distress Nose without purulence or discharge noted No skin breakdown or pressure necrosis from the CPAP mask Chest with bibasilar crackles, no wheezes Cardiac exam with regular rate and rhythm Lower extremities without edema, no cyanosis Alert and oriented, does not appear to be sleepy, moves all 4 extremities.       Assessment & Plan:

## 2013-03-22 ENCOUNTER — Encounter (HOSPITAL_COMMUNITY)
Admission: RE | Admit: 2013-03-22 | Discharge: 2013-03-22 | Disposition: A | Discharge status: Home / Self Care | Payer: PRIVATE HEALTH INSURANCE | Source: Ambulatory Visit | Attending: Pulmonary Disease | Admitting: Pulmonary Disease

## 2013-03-24 ENCOUNTER — Encounter (HOSPITAL_COMMUNITY): Payer: PRIVATE HEALTH INSURANCE

## 2013-03-29 ENCOUNTER — Encounter: Payer: Self-pay | Admitting: Pulmonary Disease

## 2013-03-29 ENCOUNTER — Encounter (HOSPITAL_COMMUNITY): Payer: PRIVATE HEALTH INSURANCE

## 2013-03-29 ENCOUNTER — Telehealth (HOSPITAL_COMMUNITY): Payer: Self-pay | Admitting: Internal Medicine

## 2013-03-29 ENCOUNTER — Ambulatory Visit: Payer: PRIVATE HEALTH INSURANCE | Admitting: Pulmonary Disease

## 2013-03-29 MED ORDER — LEVOFLOXACIN 500 MG PO TABS: 500.0000 mg | ORAL_TABLET | Freq: Every day | ORAL | Status: DC

## 2013-03-29 NOTE — Telephone Encounter (Signed)
Spoke with pt. He c/o prod cough w/ brown-yellow phlem, chest congestion (but not too bad), nasal congestion, PND, wheezing, SOB is worse at night x 2 weeks. No fever/chills/sweats. He took therflu last night. Please advise Dr. Kriste Basque as Jonathon Bellows is off. Thanks  Allergies  Allergen Reactions  . Ivp Dye [Iodinated Diagnostic Agents] Anaphylaxis    Coma for a day in '09  . Lactose Intolerance (Gi) Other (See Comments)     Current Outpatient Prescriptions on File Prior to Visit  Medication Sig Dispense Refill  . Ibuprofen (ADVIL) 200 MG CAPS Take by mouth as needed.      . silodosin (RAPAFLO) 8 MG CAPS capsule Take 8 mg by mouth daily with breakfast.       No current facility-administered medications on file prior to visit.

## 2013-03-29 NOTE — Telephone Encounter (Signed)
Per SN---    levaquin 500 mg  #7  1 daily mucinex 1-2 po bid Align once daily Increase fluids Nasal saline  i have sent med to the pharmacy and i sent email  Back to the pt to make him aware. Nothing further is needed.

## 2013-03-31 ENCOUNTER — Encounter (HOSPITAL_COMMUNITY): Payer: PRIVATE HEALTH INSURANCE

## 2013-03-31 DIAGNOSIS — J8410 Pulmonary fibrosis, unspecified: Secondary | ICD-10-CM | POA: Insufficient documentation

## 2013-03-31 DIAGNOSIS — Z5189 Encounter for other specified aftercare: Secondary | ICD-10-CM | POA: Insufficient documentation

## 2013-03-31 DIAGNOSIS — J45909 Unspecified asthma, uncomplicated: Secondary | ICD-10-CM | POA: Insufficient documentation

## 2013-04-05 ENCOUNTER — Encounter (HOSPITAL_COMMUNITY)
Admission: RE | Admit: 2013-04-05 | Discharge: 2013-04-05 | Disposition: A | Discharge status: Home / Self Care | Payer: PRIVATE HEALTH INSURANCE | Source: Ambulatory Visit | Attending: Pulmonary Disease | Admitting: Pulmonary Disease

## 2013-04-07 ENCOUNTER — Encounter (HOSPITAL_COMMUNITY)
Admission: RE | Admit: 2013-04-07 | Discharge: 2013-04-07 | Disposition: A | Discharge status: Home / Self Care | Payer: PRIVATE HEALTH INSURANCE | Source: Ambulatory Visit | Attending: Pulmonary Disease | Admitting: Pulmonary Disease

## 2013-04-11 ENCOUNTER — Ambulatory Visit: Payer: PRIVATE HEALTH INSURANCE | Admitting: *Deleted

## 2013-04-12 ENCOUNTER — Encounter (HOSPITAL_COMMUNITY)
Admission: RE | Admit: 2013-04-12 | Discharge: 2013-04-12 | Disposition: A | Discharge status: Home / Self Care | Payer: PRIVATE HEALTH INSURANCE | Source: Ambulatory Visit | Attending: Pulmonary Disease | Admitting: Pulmonary Disease

## 2013-04-14 ENCOUNTER — Encounter (HOSPITAL_COMMUNITY)
Admission: RE | Admit: 2013-04-14 | Discharge: 2013-04-14 | Disposition: A | Discharge status: Home / Self Care | Payer: PRIVATE HEALTH INSURANCE | Source: Ambulatory Visit | Attending: Pulmonary Disease | Admitting: Pulmonary Disease

## 2013-04-19 ENCOUNTER — Encounter (HOSPITAL_COMMUNITY)
Admission: RE | Admit: 2013-04-19 | Discharge: 2013-04-19 | Disposition: A | Discharge status: Home / Self Care | Payer: PRIVATE HEALTH INSURANCE | Source: Ambulatory Visit | Attending: Pulmonary Disease | Admitting: Pulmonary Disease

## 2013-04-21 ENCOUNTER — Encounter (HOSPITAL_COMMUNITY)
Admission: RE | Admit: 2013-04-21 | Discharge: 2013-04-21 | Disposition: A | Discharge status: Home / Self Care | Payer: PRIVATE HEALTH INSURANCE | Source: Ambulatory Visit | Attending: Pulmonary Disease | Admitting: Pulmonary Disease

## 2013-04-26 ENCOUNTER — Encounter (HOSPITAL_COMMUNITY)
Admission: RE | Admit: 2013-04-26 | Discharge: 2013-04-26 | Disposition: A | Discharge status: Home / Self Care | Payer: PRIVATE HEALTH INSURANCE | Source: Ambulatory Visit | Attending: Pulmonary Disease | Admitting: Pulmonary Disease

## 2013-04-28 ENCOUNTER — Encounter (HOSPITAL_COMMUNITY)
Admission: RE | Admit: 2013-04-28 | Discharge: 2013-04-28 | Disposition: A | Discharge status: Home / Self Care | Payer: PRIVATE HEALTH INSURANCE | Source: Ambulatory Visit | Attending: Pulmonary Disease | Admitting: Pulmonary Disease

## 2013-05-03 ENCOUNTER — Encounter (HOSPITAL_COMMUNITY)
Admission: RE | Admit: 2013-05-03 | Discharge: 2013-05-03 | Disposition: A | Discharge status: Home / Self Care | Payer: PRIVATE HEALTH INSURANCE | Source: Ambulatory Visit | Attending: Pulmonary Disease | Admitting: Pulmonary Disease

## 2013-05-03 DIAGNOSIS — J8410 Pulmonary fibrosis, unspecified: Secondary | ICD-10-CM | POA: Insufficient documentation

## 2013-05-03 DIAGNOSIS — Z5189 Encounter for other specified aftercare: Secondary | ICD-10-CM | POA: Insufficient documentation

## 2013-05-03 DIAGNOSIS — J45909 Unspecified asthma, uncomplicated: Secondary | ICD-10-CM | POA: Insufficient documentation

## 2013-05-04 ENCOUNTER — Telehealth: Payer: Self-pay | Admitting: Internal Medicine

## 2013-05-04 NOTE — Telephone Encounter (Signed)
Patient Information:  Caller Name: Honorio  Phone: [phone]  Patient: Chad [name], Chad [name]  Gender: Male  DOB: 1950/04/15  Age: 62 Years  PCP: Shawna Orleans Doe-Hyun Herbie [place]) (Adults only)  Office Follow Up:  Does the office need to follow up with this patient?: No  Instructions For The Office: N/A   Symptoms  Reason For Call & Symptoms: 05/01/13 onset congestion.   05/04/13 stuffy nose and head congestion, taking Mucinex D but didn't help, scratchy throat.  Afebrile.  No cough.  Blood tinged discharge from nose.  Pt request abx, advised pt of need for appt for abx.  Care advice given per protocol, pt states he will follow care advice and call back with any worsening sxs or questions.  Reviewed Health History In EMR: Yes  Reviewed Medications In EMR: Yes  Reviewed Allergies In EMR: Yes  Reviewed Surgeries / Procedures: Yes  Date of Onset of Symptoms: 05/01/2013  Guideline(s) Used:  Colds  Sinus Pain and Congestion  Disposition Per Guideline:   Home Care  Reason For Disposition Reached:   Sinus congestion as part of a cold, present < 10 days  Advice Given:  For a Runny Nose With Profuse Discharge:  Nasal mucus and discharge helps to wash viruses and bacteria out of the nose and sinuses.  Blowing the nose is all that is needed.  For a Stuffy Nose - Use Nasal Washes:  Introduction: Saline (salt water) nasal irrigation (nasal wash) is an effective and simple home remedy for treating stuffy nose and sinus congestion. The nose can be irrigated by pouring, spraying, or squirting salt water into the nose and then letting it run back out.  How it Helps: The salt water rinses out excess mucus, washes out any irritants (dust, allergens) that might be present, and moistens the nasal cavity.  Methods: There are several ways to perform nasal irrigation. You can use a saline nasal spray bottle (available over-the-counter), a rubber ear syringe, a medical syringe without the needle, or a Neti Pot.  Medicines for a Stuffy or Runny Nose:  Most cold medicines that are available over-the-counter (OTC) are not helpful.  Antihistamines: Are only helpful if you also have nasal allergies.  Caution - Nasal Decongestants:  Do not take these medications if you have high blood pressure, heart disease, prostate problems, or an overactive thyroid.  Pain and Fever Medicines:  For pain or fever relief, take either acetaminophen or ibuprofen.  Hydration:  Drink plenty of liquids (6-8 glasses of water daily). If the air in your home is dry, use a cool mist humidifier  Expected Course:  Sinus congestion from viral upper respiratory infections (colds) usually lasts 5-10 days.  Occasionally a cold can worsen and turn into bacterial sinusitis. Clues to this are sinus symptoms lasting longer than 10 days, fever lasting longer than 3 days, and worsening pain. Bacterial sinusitis may need antibiotic treatment.  Call Back If:   Sinus pain lasts longer than 1 day after starting treatment using nasal washes  Sinus congestion (fullness) lasts longer than 10 days  You become worse.  Patient Will Follow Care Advice:  YES

## 2013-05-05 ENCOUNTER — Encounter (HOSPITAL_COMMUNITY)
Admission: RE | Admit: 2013-05-05 | Discharge: 2013-05-05 | Disposition: A | Discharge status: Home / Self Care | Payer: PRIVATE HEALTH INSURANCE | Source: Ambulatory Visit | Attending: Pulmonary Disease | Admitting: Pulmonary Disease

## 2013-05-10 ENCOUNTER — Encounter (HOSPITAL_COMMUNITY)
Admission: RE | Admit: 2013-05-10 | Discharge: 2013-05-10 | Disposition: A | Discharge status: Home / Self Care | Payer: PRIVATE HEALTH INSURANCE | Source: Ambulatory Visit | Attending: Pulmonary Disease | Admitting: Pulmonary Disease

## 2013-05-12 ENCOUNTER — Encounter (HOSPITAL_COMMUNITY)
Admission: RE | Admit: 2013-05-12 | Discharge: 2013-05-12 | Disposition: A | Discharge status: Home / Self Care | Payer: PRIVATE HEALTH INSURANCE | Source: Ambulatory Visit | Attending: Pulmonary Disease | Admitting: Pulmonary Disease

## 2013-05-17 ENCOUNTER — Encounter (HOSPITAL_COMMUNITY): Payer: PRIVATE HEALTH INSURANCE

## 2013-05-19 ENCOUNTER — Encounter (HOSPITAL_COMMUNITY)
Admission: RE | Admit: 2013-05-19 | Discharge: 2013-05-19 | Disposition: A | Discharge status: Home / Self Care | Payer: PRIVATE HEALTH INSURANCE | Source: Ambulatory Visit | Attending: Pulmonary Disease | Admitting: Pulmonary Disease

## 2013-05-24 ENCOUNTER — Encounter (HOSPITAL_COMMUNITY)
Admission: RE | Admit: 2013-05-24 | Discharge: 2013-05-24 | Disposition: A | Discharge status: Home / Self Care | Payer: PRIVATE HEALTH INSURANCE | Source: Ambulatory Visit | Attending: Pulmonary Disease | Admitting: Pulmonary Disease

## 2013-05-26 ENCOUNTER — Encounter (HOSPITAL_COMMUNITY): Payer: PRIVATE HEALTH INSURANCE

## 2013-05-31 ENCOUNTER — Encounter (HOSPITAL_COMMUNITY)
Admission: RE | Admit: 2013-05-31 | Discharge: 2013-05-31 | Disposition: A | Discharge status: Home / Self Care | Payer: PRIVATE HEALTH INSURANCE | Source: Ambulatory Visit | Attending: Pulmonary Disease | Admitting: Pulmonary Disease

## 2013-05-31 DIAGNOSIS — Z5189 Encounter for other specified aftercare: Secondary | ICD-10-CM | POA: Insufficient documentation

## 2013-05-31 DIAGNOSIS — J45909 Unspecified asthma, uncomplicated: Secondary | ICD-10-CM | POA: Insufficient documentation

## 2013-05-31 DIAGNOSIS — J8410 Pulmonary fibrosis, unspecified: Secondary | ICD-10-CM | POA: Insufficient documentation

## 2013-06-02 ENCOUNTER — Encounter (HOSPITAL_COMMUNITY)
Admission: RE | Admit: 2013-06-02 | Discharge: 2013-06-02 | Disposition: A | Discharge status: Home / Self Care | Payer: PRIVATE HEALTH INSURANCE | Source: Ambulatory Visit | Attending: Pulmonary Disease | Admitting: Pulmonary Disease

## 2013-07-20 ENCOUNTER — Ambulatory Visit (INDEPENDENT_AMBULATORY_CARE_PROVIDER_SITE_OTHER)
Admission: RE | Admit: 2013-07-20 | Discharge: 2013-07-20 | Disposition: A | Discharge status: Home / Self Care | Payer: PRIVATE HEALTH INSURANCE | Source: Ambulatory Visit | Attending: Pulmonary Disease | Admitting: Pulmonary Disease

## 2013-07-20 ENCOUNTER — Ambulatory Visit (INDEPENDENT_AMBULATORY_CARE_PROVIDER_SITE_OTHER): Payer: PRIVATE HEALTH INSURANCE | Admitting: Pulmonary Disease

## 2013-07-20 ENCOUNTER — Encounter: Payer: Self-pay | Admitting: Pulmonary Disease

## 2013-07-20 VITALS — BP 112/78 | HR 80 | Temp 97.0°F | Ht 74.0 in | Wt 279.0 lb

## 2013-07-20 DIAGNOSIS — R918 Other nonspecific abnormal finding of lung field: Secondary | ICD-10-CM

## 2013-07-20 DIAGNOSIS — G4733 Obstructive sleep apnea (adult) (pediatric): Secondary | ICD-10-CM

## 2013-07-20 NOTE — Assessment & Plan Note (Signed)
The patient is doing very well with his CPAP device, and has also been working on weight loss successfully. I've asked him to continue doing this, and to keep up with his mask changes and supplies.

## 2013-07-20 NOTE — Progress Notes (Signed)
   Subjective:    Patient ID: Chad [name]., male    DOB: 1950-09-19, 63 y.o.   MRN: [id number]  HPI    Review of Systems  Constitutional: Negative for fever and unexpected weight change.  HENT: Negative for congestion, dental problem, ear pain, nosebleeds, postnasal drip, rhinorrhea, sinus pressure, sneezing, sore throat and trouble swallowing.   Eyes: Negative for redness and itching.  Respiratory: Negative for cough, chest tightness, shortness of breath and wheezing.   Cardiovascular: Negative for palpitations and leg swelling.  Gastrointestinal: Negative for nausea and vomiting.  Genitourinary: Negative for dysuria.  Musculoskeletal: Negative for joint swelling.  Skin: Negative for rash.  Neurological: Negative for headaches.  Hematological: Does not bruise/bleed easily.  Psychiatric/Behavioral: Negative for dysphoric mood. The patient is not nervous/anxious.        Objective:   Physical Exam        Assessment & Plan:

## 2013-07-20 NOTE — Patient Instructions (Signed)
Will check chest xray today, and will call with results. Keep working on weight loss.  You are doing well Stay on cpap, and keep up with mask cushion changes and supplies. followup with me again in 42mos.

## 2013-07-20 NOTE — Assessment & Plan Note (Signed)
The patient is doing very well from a pulmonary standpoint, and feels that he is totally back to his usual baseline. He denies any significant shortness of breath or cough. I suspect that we will never know what caused all of this, but we'll keep an eye on him going forward.

## 2013-07-28 ENCOUNTER — Telehealth: Payer: Self-pay | Admitting: Radiation Oncology

## 2013-07-28 NOTE — Telephone Encounter (Signed)
Returned message left requesting "authorization for 11/17" from [place]. No answer left message.

## 2013-11-17 ENCOUNTER — Encounter: Payer: Self-pay | Admitting: Gastroenterology

## 2013-12-20 ENCOUNTER — Ambulatory Visit (INDEPENDENT_AMBULATORY_CARE_PROVIDER_SITE_OTHER): Payer: PRIVATE HEALTH INSURANCE | Admitting: Family Medicine

## 2013-12-20 ENCOUNTER — Encounter: Payer: Self-pay | Admitting: Family Medicine

## 2013-12-20 VITALS — BP 145/85 | HR 86 | Temp 98.1°F | Ht 74.0 in | Wt 275.0 lb

## 2013-12-20 DIAGNOSIS — R42 Dizziness and giddiness: Secondary | ICD-10-CM

## 2013-12-20 LAB — POCT URINALYSIS DIPSTICK
Bilirubin, UA: NEGATIVE
Blood, UA: NEGATIVE
GLUCOSE UA: NEGATIVE
KETONES UA: NEGATIVE
Leukocytes, UA: NEGATIVE
Nitrite, UA: NEGATIVE
PROTEIN UA: NEGATIVE
Urobilinogen, UA: 0.2
pH, UA: 5.5

## 2013-12-20 LAB — BASIC METABOLIC PANEL
BUN: 13 mg/dL (ref 6–23)
CHLORIDE: 105 meq/L (ref 96–112)
CO2: 26 mEq/L (ref 19–32)
CREATININE: 0.7 mg/dL (ref 0.4–1.5)
Calcium: 9 mg/dL (ref 8.4–10.5)
GFR: 136.67 mL/min (ref >60.00)
Glucose, Bld: 81 mg/dL (ref 70–99)
Potassium: 3.9 mEq/L (ref 3.5–5.1)
Sodium: 136 mEq/L (ref 135–145)

## 2013-12-20 LAB — CBC WITH DIFFERENTIAL/PLATELET
Basophils Absolute: 0 10*3/uL (ref 0.0–0.1)
Basophils Relative: 0.5 % (ref 0.0–3.0)
EOS PCT: 3.8 % (ref 0.0–5.0)
Eosinophils Absolute: 0.3 10*3/uL (ref 0.0–0.7)
HCT: 38.3 % — ABNORMAL LOW (ref 39.0–52.0)
HEMOGLOBIN: 12.8 g/dL — AB (ref 13.0–17.0)
LYMPHS ABS: 2.9 10*3/uL (ref 0.7–4.0)
Lymphocytes Relative: 36.7 % (ref 12.0–46.0)
MCHC: 33.5 g/dL (ref 30.0–36.0)
MCV: 88.9 fl (ref 78.0–100.0)
MONOS PCT: 6.9 % (ref 3.0–12.0)
Monocytes Absolute: 0.5 10*3/uL (ref 0.1–1.0)
NEUTROS ABS: 4.1 10*3/uL (ref 1.4–7.7)
Neutrophils Relative %: 52.1 % (ref 43.0–77.0)
Platelets: 141 10*3/uL — ABNORMAL LOW (ref 150.0–400.0)
RBC: 4.3 Mil/uL (ref 4.22–5.81)
RDW: 16.1 % — ABNORMAL HIGH (ref 11.5–15.5)
WBC: 7.9 10*3/uL (ref 4.0–10.5)

## 2013-12-20 LAB — HEPATIC FUNCTION PANEL
ALT: 17 U/L (ref 0–53)
AST: 24 U/L (ref 0–37)
Albumin: 3.7 g/dL (ref 3.5–5.2)
Alkaline Phosphatase: 64 U/L (ref 39–117)
BILIRUBIN DIRECT: 0 mg/dL (ref 0.0–0.3)
Total Bilirubin: 0.4 mg/dL (ref 0.2–1.2)
Total Protein: 8 g/dL (ref 6.0–8.3)

## 2013-12-20 LAB — TSH: TSH: 0.92 u[IU]/mL (ref 0.35–4.50)

## 2013-12-20 NOTE — Progress Notes (Signed)
Pre visit review using our clinic review tool, if applicable. No additional management support is needed unless otherwise documented below in the visit note. 

## 2013-12-20 NOTE — Progress Notes (Signed)
   Subjective:    Patient ID: Chad [name]., male    DOB: [birth year], 63 y.o.   MRN: [id number]  HPI Here to discuss 3 episodes of lightheadedness he has experienced in the past 6 weeks. He had never felt anything like this before that. Each time he was standing on his feet and suddenly felt very lightheaded like he might pass out. He never actually lost consciousness but worried that he might. These spells were brief, lasting only 15 to 30 seconds each. He denies any SOB or chest pains or palpitations. No blurred vision or HAs. He is not taking any medication at this time (he stopped taking Rapaflo 4 months ago). He does have some idiopathic pulmonary fibrosis.    Review of Systems  Constitutional: Negative.   HENT: Negative.   Eyes: Negative.   Respiratory: Negative.   Cardiovascular: Negative.   Neurological: Positive for light-headedness. Negative for dizziness, tremors, seizures, syncope, facial asymmetry, speech difficulty, weakness, numbness and headaches.       Objective:   Physical Exam  Constitutional: He is oriented to person, place, and time. He appears well-developed and well-nourished. No distress.  HENT:  Head: Normocephalic and atraumatic.  Right Ear: External ear normal.  Left Ear: External ear normal.  Mouth/Throat: Oropharynx is clear and moist.  Eyes: Conjunctivae and EOM are normal. Pupils are equal, round, and reactive to light.  Neck: Neck supple. No thyromegaly present.  Cardiovascular: Normal rate, regular rhythm, normal heart sounds and intact distal pulses.   EKG normal   Lymphadenopathy:    He has no cervical adenopathy.  Neurological: He is alert and oriented to person, place, and time. He has normal reflexes. No cranial nerve deficit. He exhibits normal muscle tone. Coordination normal.          Assessment & Plan:  We ill get labs today, also set him up for a cardiac event monitor and carotid dopplers soon.

## 2013-12-22 ENCOUNTER — Ambulatory Visit (HOSPITAL_COMMUNITY): Payer: PRIVATE HEALTH INSURANCE | Attending: Internal Medicine | Admitting: Radiology

## 2013-12-22 ENCOUNTER — Encounter: Payer: Self-pay | Admitting: *Deleted

## 2013-12-22 ENCOUNTER — Encounter (INDEPENDENT_AMBULATORY_CARE_PROVIDER_SITE_OTHER): Payer: PRIVATE HEALTH INSURANCE

## 2013-12-22 DIAGNOSIS — R42 Dizziness and giddiness: Secondary | ICD-10-CM | POA: Insufficient documentation

## 2013-12-22 DIAGNOSIS — R55 Syncope and collapse: Secondary | ICD-10-CM

## 2013-12-22 DIAGNOSIS — I6529 Occlusion and stenosis of unspecified carotid artery: Secondary | ICD-10-CM

## 2013-12-22 NOTE — Progress Notes (Signed)
Patient ID: Chad Sorg., male   DOB: 29-Aug-1950, 63 y.o.   MRN: [id number] [employer] verite 30 day cardiac event monitor applied to patient.

## 2013-12-22 NOTE — Progress Notes (Signed)
Carotid Duplex performed. 

## 2014-01-09 ENCOUNTER — Telehealth: Payer: Self-pay | Admitting: *Deleted

## 2014-01-09 ENCOUNTER — Telehealth: Payer: Self-pay | Admitting: Internal Medicine

## 2014-01-09 NOTE — Telephone Encounter (Signed)
Agreed. We will await the report on this.

## 2014-01-09 NOTE — Telephone Encounter (Signed)
FYI: Pt put on heart monitor by dr fry. Pt states the monitor scratches him and is uncomfortable. Pt can not take it any longer and is going to call and have it returned.

## 2014-01-09 NOTE — Telephone Encounter (Signed)
Spoke with patient regarding his desire to discontinue monitor due to redness and soreness from electrodes.  Explained to patient that his insurance would be billed for a 30 day cardiac event monitor regardless of him wearing the monitor for 12 days or 30.  To date, we have not recorded anything significant that would explain his dizziness and presyncope.   I will leave samples of Kendall cloth electrodes at the front desk for the patient to try.  Try to wear the monitor with this different type of electrode in hopes of extending his monitor wear time.   Patient was also told to take a break from the electrodes as needed if causing redness, soreness.  Contact [employer] if he will not be wearing his monitor for 48 hours or longer.

## 2014-01-25 ENCOUNTER — Ambulatory Visit (INDEPENDENT_AMBULATORY_CARE_PROVIDER_SITE_OTHER): Payer: PRIVATE HEALTH INSURANCE | Admitting: Pulmonary Disease

## 2014-01-25 ENCOUNTER — Ambulatory Visit (INDEPENDENT_AMBULATORY_CARE_PROVIDER_SITE_OTHER)
Admission: RE | Admit: 2014-01-25 | Discharge: 2014-01-25 | Disposition: A | Discharge status: Home / Self Care | Payer: PRIVATE HEALTH INSURANCE | Source: Ambulatory Visit | Attending: Pulmonary Disease | Admitting: Pulmonary Disease

## 2014-01-25 ENCOUNTER — Encounter: Payer: Self-pay | Admitting: Gastroenterology

## 2014-01-25 ENCOUNTER — Encounter: Payer: Self-pay | Admitting: Pulmonary Disease

## 2014-01-25 VITALS — BP 138/68 | HR 82 | Temp 98.4°F | Ht 74.0 in | Wt 284.0 lb

## 2014-01-25 DIAGNOSIS — R918 Other nonspecific abnormal finding of lung field: Secondary | ICD-10-CM

## 2014-01-25 DIAGNOSIS — G4733 Obstructive sleep apnea (adult) (pediatric): Secondary | ICD-10-CM

## 2014-01-25 DIAGNOSIS — Z23 Encounter for immunization: Secondary | ICD-10-CM

## 2014-01-25 NOTE — Patient Instructions (Signed)
Will give you the flu shot today. Stay on cpap, and keep up with mask changes and supplies Work on weight loss Will check one final chest xray to make sure everything looks good. followup with me again in one year.

## 2014-01-25 NOTE — Assessment & Plan Note (Signed)
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 Is resolved radiographically on prednisone, and have not returned even off the medication. He feels that he is doing well from a pulmonary standpoint, and we'll check a chest x-ray today for one last time.

## 2014-01-25 NOTE — Assessment & Plan Note (Signed)
The patient is doing well on C Pap, and feels that it is continuing to help his sleep and daytime alertness. He is having no issues with his mask fit or pressure. I have encouraged him to work aggressively on weight loss, and we'll see him back in one year to check on progress.

## 2014-01-25 NOTE — Progress Notes (Signed)
   Subjective:    Patient ID: Chad [name]., male    DOB: [birth year], 63 y.o.   MRN: [id number]  HPI The patient comes in today for follow-up of his known obstructive sleep apnea. He also has a history of steroid responsive infiltrates that have resolved on treatment. He denies any issues with shortness of breath, cough, mucus production. He is wearing C Pap compliantly, and feels that he is sleeping well with the device. He also feels that his alertness during the day is adequate. He is in need of new supplies, and an order will be sent.   Review of Systems  Constitutional: Negative for fever and unexpected weight change.  HENT: Negative for congestion, dental problem, ear pain, nosebleeds, postnasal drip, rhinorrhea, sinus pressure, sneezing, sore throat and trouble swallowing.   Eyes: Negative for redness and itching.  Respiratory: Negative for cough, chest tightness, shortness of breath and wheezing.   Cardiovascular: Negative for palpitations and leg swelling.  Gastrointestinal: Negative for nausea and vomiting.  Genitourinary: Negative for dysuria.  Musculoskeletal: Negative for joint swelling.  Skin: Negative for rash.  Neurological: Negative for headaches.  Hematological: Does not bruise/bleed easily.  Psychiatric/Behavioral: Negative for dysphoric mood. The patient is not nervous/anxious.        Objective:   Physical Exam Overweight male in no acute distress Nose without purulence or discharge noted No skin breakdown or pressure necrosis C Pap mask Neck without lymphadenopathy or thyromegaly Chest totally clear to auscultation, no wheezing Cardiac exam with regular rate and rhythm, no MRG Lower extremities with minimal edema, no cyanosis Alert, does not appear to be sleepy, moves all 4 extremities.       Assessment & Plan:

## 2014-02-21 ENCOUNTER — Telehealth: Payer: Self-pay | Admitting: *Deleted

## 2014-02-21 ENCOUNTER — Ambulatory Visit (AMBULATORY_SURGERY_CENTER): Payer: Self-pay | Admitting: *Deleted

## 2014-02-21 VITALS — Ht 74.0 in | Wt 280.6 lb

## 2014-02-21 DIAGNOSIS — Z86010 Personal history of colonic polyps: Secondary | ICD-10-CM

## 2014-02-21 MED ORDER — MOVIPREP 100 G PO SOLR: 1.0000 | Freq: Once | ORAL | Status: DC

## 2014-02-21 NOTE — Progress Notes (Signed)
No egg or soy allergy. ewm No home 02 use. ewm No blood thinners, no diet pills. ewm Pt states no issues with past sedation. 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 Will TE john nulty about this. Last colon here was 01-15-2009 with moderate sedation. Explained to pt this may change him to a hospital case. ewm

## 2014-02-21 NOTE — Telephone Encounter (Signed)
Chad [name],  I saw this pt today in pre visit and under history there was a complication of anesthesia. 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 Please look into this for me. I do however assume this procedure will need to be moved to the hospital due to this reason. He also has interstitial lung disease but no home 02 use and he uses c pap. He has no cardiac conditions. He is scheduled with Dr Ardis Hughs 12-23 Wednesday at 1100 am. He states if he has to change to hospital he wants to wait until January 2016 for his colon.   Please advise.  Thanks very much in advance.  Lelan Pons PV

## 2014-02-21 NOTE — Telephone Encounter (Signed)
Lelan Pons,  Thank you for your note regarding this pt.  I have reviewed his chart and he is cleared for care at [facility].  Best regards,  Jenny Reichmann

## 2014-02-21 NOTE — Telephone Encounter (Signed)
Pt made aware ok to proceed per Osvaldo Angst CRNA. As scheduled. Pt verbalized understanding of this  Marijean Niemann

## 2014-03-22 ENCOUNTER — Encounter: Payer: PRIVATE HEALTH INSURANCE | Admitting: Gastroenterology

## 2014-04-05 DIAGNOSIS — R300 Dysuria: Secondary | ICD-10-CM | POA: Diagnosis not present

## 2014-05-04 IMAGING — CR DG CHEST 2V
2 series · 2 of 2 positions shown · non-contrast
Comparison: 01/02/2011

CLINICAL DATA: Cough

CHEST - 2 VIEW

[w chest pa]
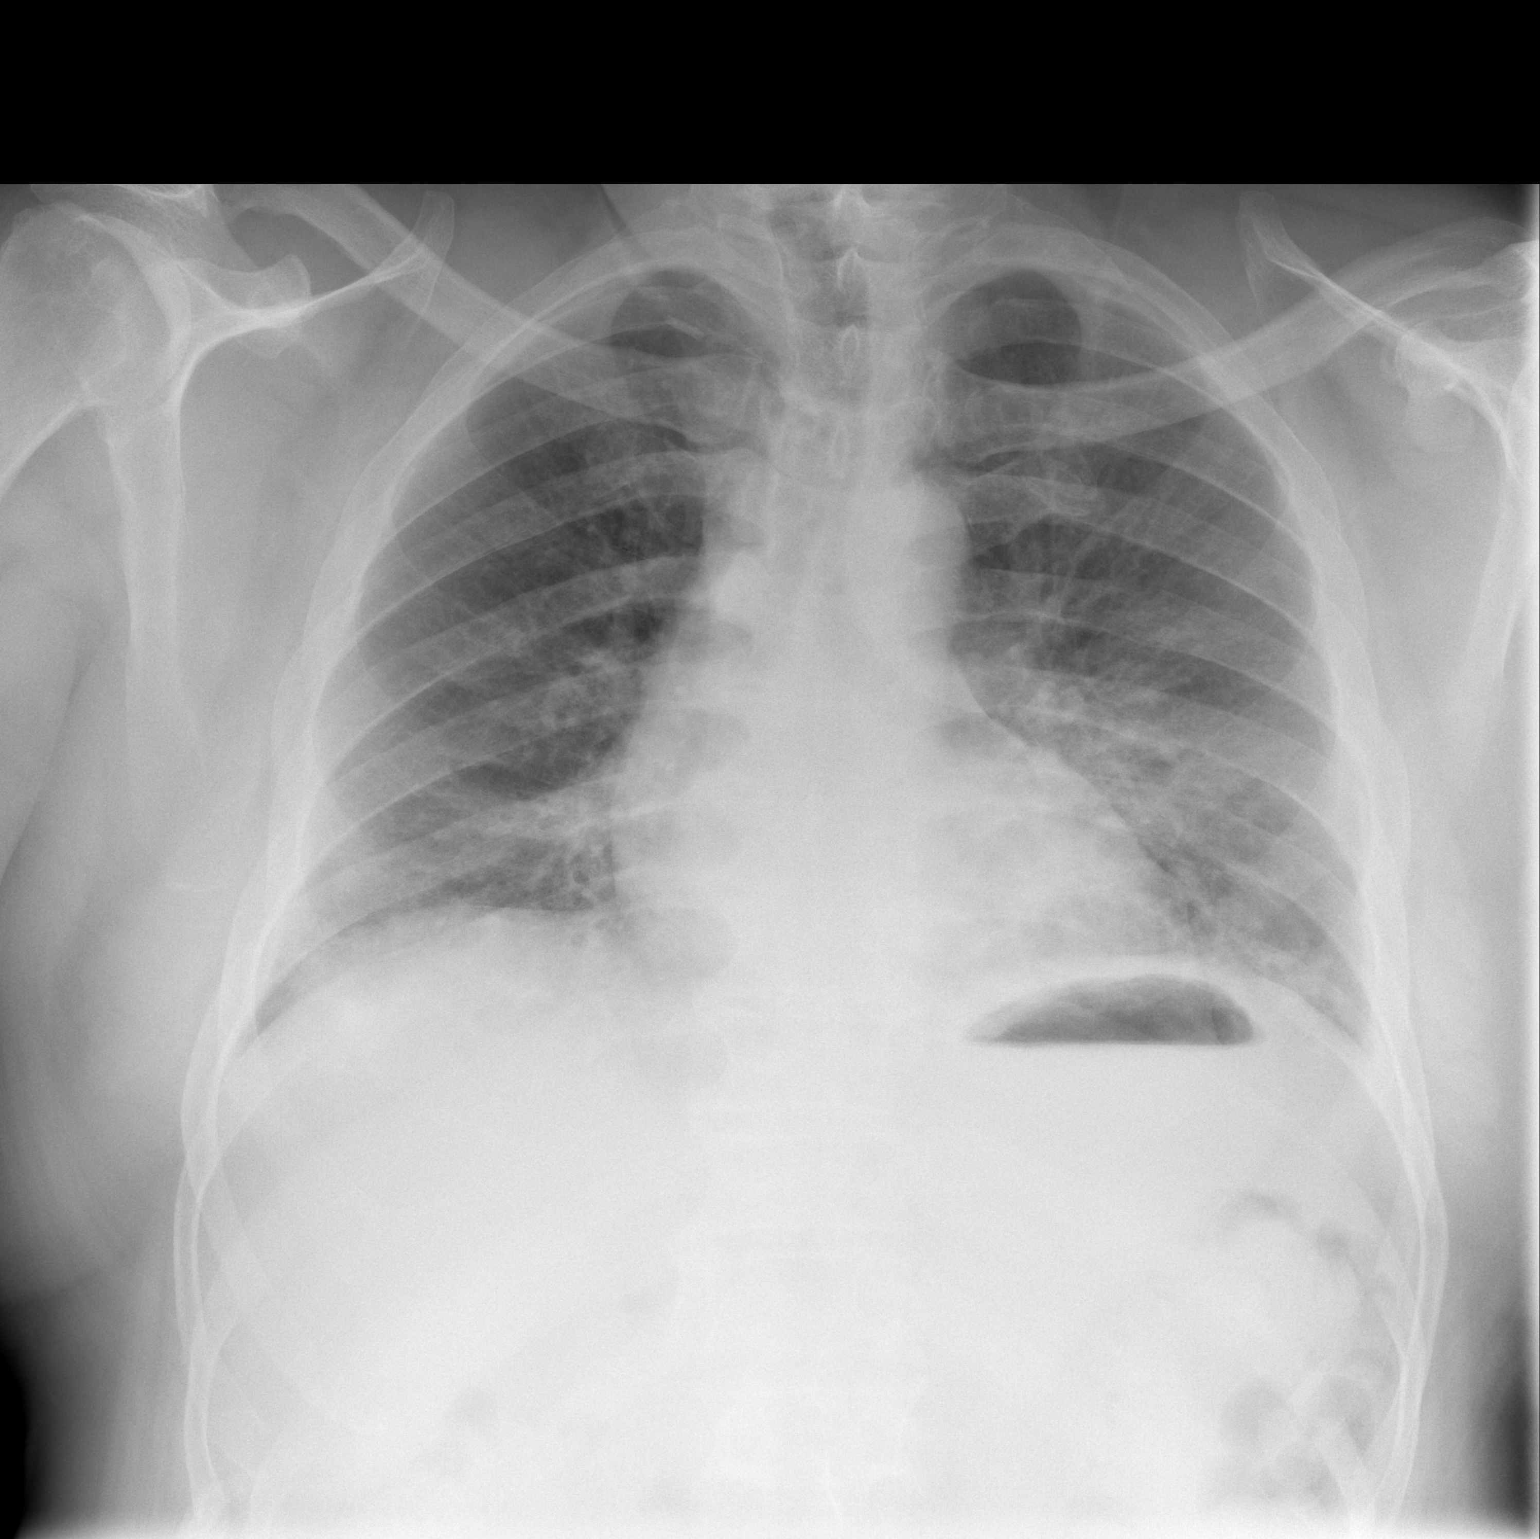

[w chest lat]
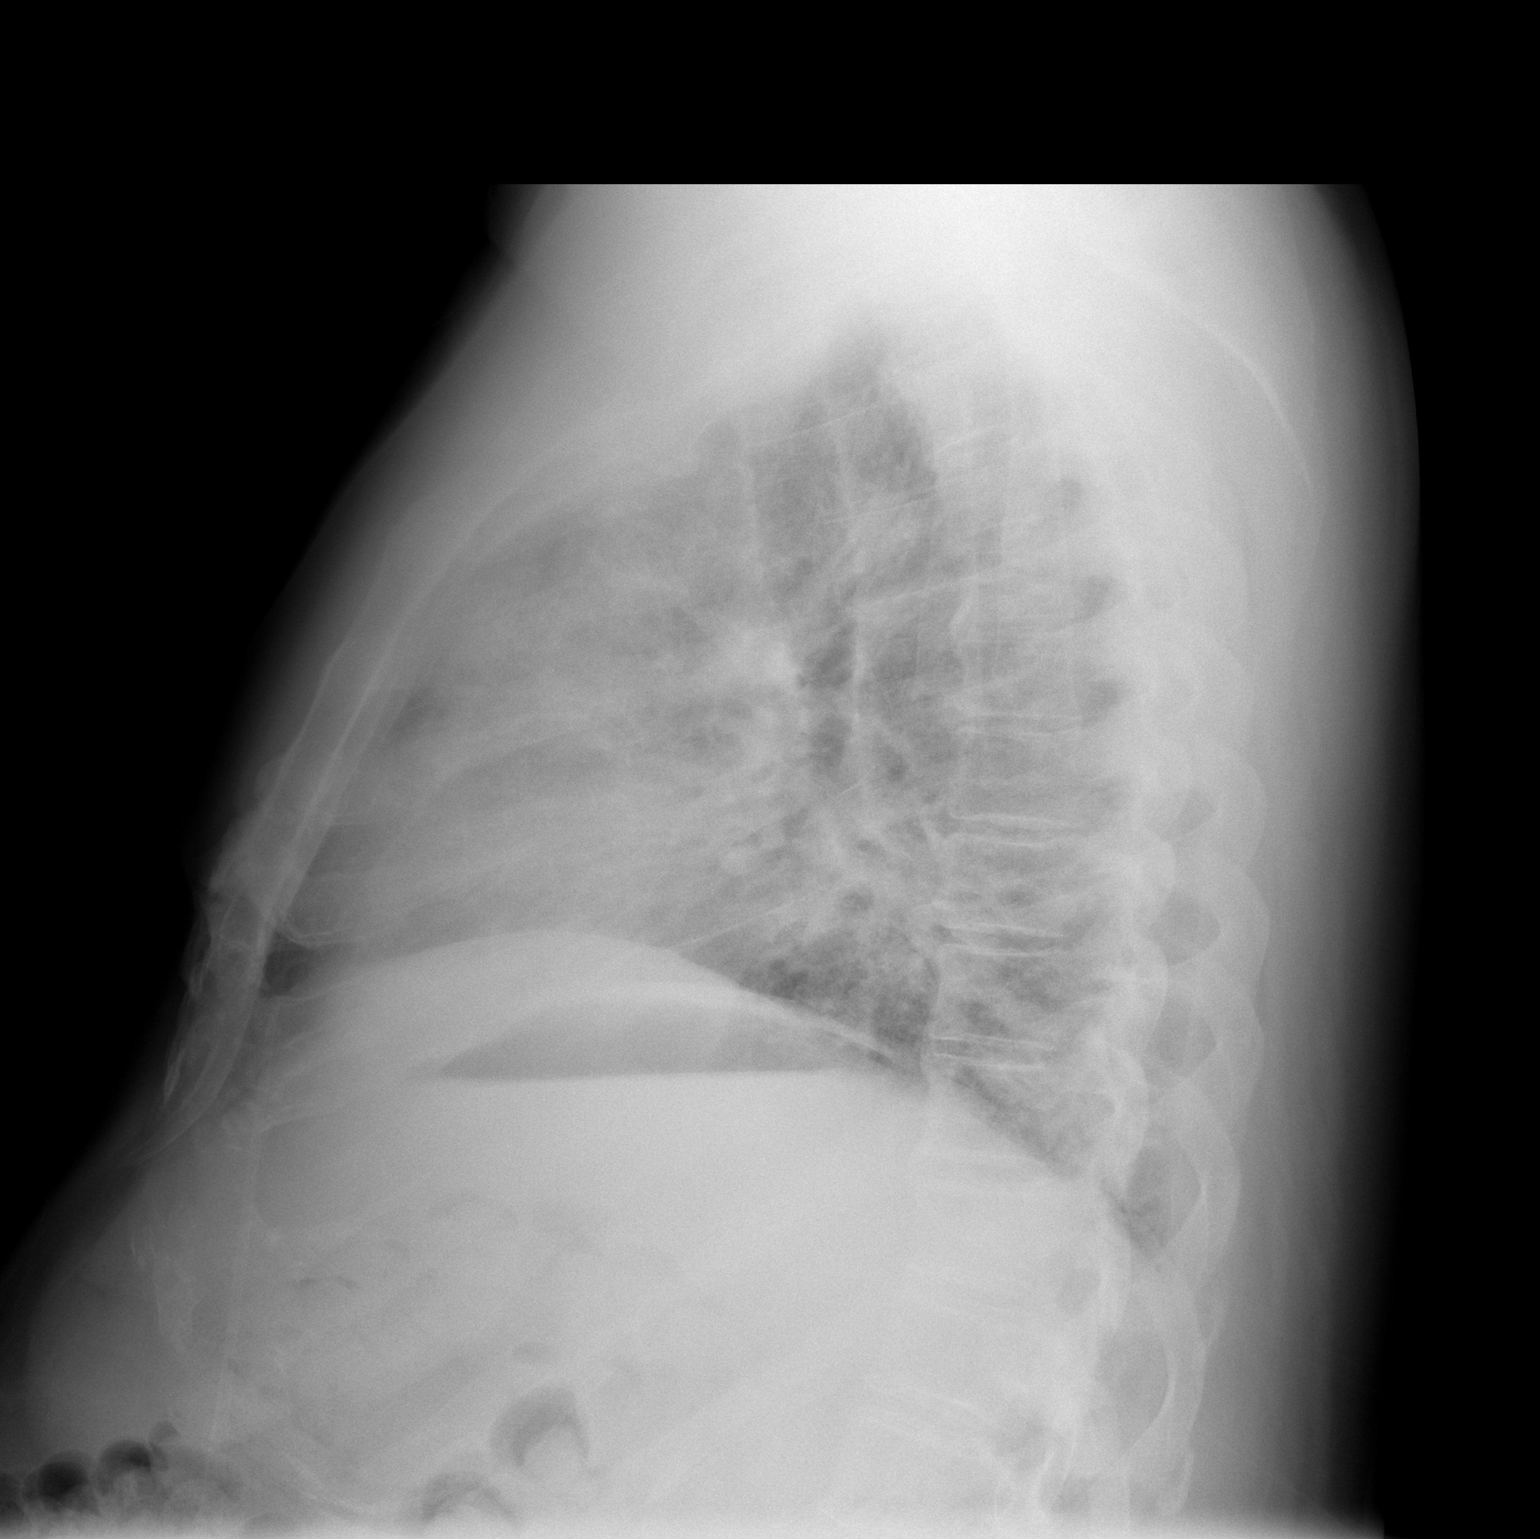

[2 of 2 positions shown; findings below may reference images not displayed]

FINDINGS: The heart and pulmonary vascularity are within normal
limits.  There is early infiltrate in the left lung base.  No
sizable effusion is seen.  The osseous structures are within normal
limits.
IMPRESSION: Left basilar infiltrate

## 2014-05-09 ENCOUNTER — Encounter: Payer: Self-pay | Admitting: Gastroenterology

## 2014-05-09 ENCOUNTER — Ambulatory Visit (AMBULATORY_SURGERY_CENTER): Payer: Medicare Other | Admitting: Gastroenterology

## 2014-05-09 VITALS — BP 135/83 | HR 78 | Temp 98.7°F | Resp 23 | Ht 74.0 in | Wt 280.0 lb

## 2014-05-09 DIAGNOSIS — D124 Benign neoplasm of descending colon: Secondary | ICD-10-CM

## 2014-05-09 DIAGNOSIS — Z86010 Personal history of colonic polyps: Secondary | ICD-10-CM | POA: Diagnosis not present

## 2014-05-09 DIAGNOSIS — Z800 Family history of malignant neoplasm of digestive organs: Secondary | ICD-10-CM

## 2014-05-09 DIAGNOSIS — D123 Benign neoplasm of transverse colon: Secondary | ICD-10-CM | POA: Diagnosis not present

## 2014-05-09 DIAGNOSIS — G4733 Obstructive sleep apnea (adult) (pediatric): Secondary | ICD-10-CM | POA: Diagnosis not present

## 2014-05-09 MED ORDER — SODIUM CHLORIDE 0.9 % IV SOLN: 500.0000 mL | INTRAVENOUS | Status: DC

## 2014-05-09 NOTE — Progress Notes (Signed)
Called to room to assist during endoscopic procedure.  Patient ID and intended procedure confirmed with present staff. Received instructions for my participation in the procedure from the performing physician.  

## 2014-05-09 NOTE — Op Note (Signed)
[place]  [address] [place], [address]   COLONOSCOPY PROCEDURE REPORT  PATIENT: Chad, [name]  MR#: [id number] BIRTHDATE: 10-Apr-1950 , 31  yrs. old GENDER: male ENDOSCOPIST: Milus Banister, MD PROCEDURE DATE:  05/09/2014 PROCEDURE:   Colonoscopy with snare polypectomy First Screening Colonoscopy - Avg.  risk and is 50 yrs.  old or older - No.  Prior Negative Screening - Now for repeat screening. N/A  History of Adenoma - Now for follow-up colonoscopy & has been > or = to 3 yrs.  Yes hx of adenoma.  Has been 3 or more years since last colonoscopy.  Polyps Removed Today? Yes. ASA CLASS:   Class II INDICATIONS:>1cm TA removed in past, most recent colonoscopy single small TA, father had colon cancer. MEDICATIONS: Monitored anesthesia care and Propofol 300 mg IV  DESCRIPTION OF PROCEDURE:   After the risks benefits and alternatives of the procedure were thoroughly explained, informed consent was obtained.  The digital rectal exam revealed no abnormalities of the rectum.   The LB [id number]  endoscope was introduced through the anus and advanced to the cecum, which was identified by both the appendix and ileocecal valve. No adverse events experienced.   The quality of the prep was excellent.  The instrument was then slowly withdrawn as the colon was fully examined.  COLON FINDINGS: Two polyps were found, removed and sent to pathology.  These were both sessile, 3-76mm across, located in transverse and descending segments, removed with cold snare.  There were a few diverticulum scattered throughout the colon.  The examination was otherwise normal.  Retroflexed views revealed no abnormalities. The time to cecum=3 minutes 53 seconds.  Withdrawal time=6 minutes 16 seconds.  The scope was withdrawn and the procedure completed. COMPLICATIONS: There were no immediate complications.  ENDOSCOPIC IMPRESSION: Two polyps were found, removed and sent to  pathology. There were a few diverticulum scattered throughout the colon. The examination was otherwise normal  RECOMMENDATIONS: If the polyp(s) removed today are proven to be adenomatous (pre-cancerous) polyps, you will need a repeat colonoscopy in 5 years.  You will receive a letter within 1-2 weeks with the results of your biopsy as well as final recommendations.  Please call my office if you have not received a letter after 3 weeks.  eSigned:  Milus Banister, MD 05/09/2014 9:58 AM   cc: Freddie Apley, DO

## 2014-05-09 NOTE — Progress Notes (Signed)
Report to PACU, RN, vss, BBS= Clear.  

## 2014-05-09 NOTE — Patient Instructions (Signed)
Discharge instructions given. Handouts on polyps and diverticulosis. Resume previous medications. YOU HAD AN ENDOSCOPIC PROCEDURE TODAY AT THE [facility]: Refer to the procedure report that was given to you for any specific questions about what was found during the examination.  If the procedure report does not answer your questions, please call your gastroenterologist to clarify.  If you requested that your care partner not be given the details of your procedure findings, then the procedure report has been included in a sealed envelope for you to review at your convenience later.  YOU SHOULD EXPECT: Some feelings of bloating in the abdomen. Passage of more gas than usual.  Walking can help get rid of the air that was put into your GI tract during the procedure and reduce the bloating. If you had a lower endoscopy (such as a colonoscopy or flexible sigmoidoscopy) you may notice spotting of blood in your stool or on the toilet paper. If you underwent a bowel prep for your procedure, then you may not have a normal bowel movement for a few days.  DIET: Your first meal following the procedure should be a light meal and then it is ok to progress to your normal diet.  A half-sandwich or bowl of soup is an example of a good first meal.  Heavy or fried foods are harder to digest and may make you feel nauseous or bloated.  Likewise meals heavy in dairy and vegetables can cause extra gas to form and this can also increase the bloating.  Drink plenty of fluids but you should avoid alcoholic beverages for 24 hours.  ACTIVITY: Your care partner should take you home directly after the procedure.  You should plan to take it easy, moving slowly for the rest of the day.  You can resume normal activity the day after the procedure however you should NOT DRIVE or use heavy machinery for 24 hours (because of the sedation medicines used during the test).    SYMPTOMS TO REPORT IMMEDIATELY: A gastroenterologist  can be reached at any hour.  During normal business hours, 8:30 AM to 5:00 PM Monday through Friday, call [phone].  After hours and on weekends, please call the GI answering service at [phone] who will take a message and have the physician on call contact you.   Following lower endoscopy (colonoscopy or flexible sigmoidoscopy):  Excessive amounts of blood in the stool  Significant tenderness or worsening of abdominal pains  Swelling of the abdomen that is new, acute  Fever of 100F or higher  FOLLOW UP: If any biopsies were taken you will be contacted by phone or by letter within the next 1-3 weeks.  Call your gastroenterologist if you have not heard about the biopsies in 3 weeks.  Our staff will call the home number listed on your records the next business day following your procedure to check on you and address any questions or concerns that you may have at that time regarding the information given to you following your procedure. This is a courtesy call and so if there is no answer at the home number and we have not heard from you through the emergency physician on call, we will assume that you have returned to your regular daily activities without incident.  SIGNATURES/CONFIDENTIALITY: You and/or your care partner have signed paperwork which will be entered into your electronic medical record.  These signatures attest to the fact that that the information above on your After Visit Summary has been reviewed   and is understood.  Full responsibility of the confidentiality of this discharge information lies with you and/or your care-partner. 

## 2014-05-10 ENCOUNTER — Telehealth: Payer: Self-pay | Admitting: *Deleted

## 2014-05-10 NOTE — Telephone Encounter (Signed)
  Follow up Call-  Call back number 05/09/2014  Post procedure Call Back phone  # [phone]  Permission to leave phone message Yes     Patient questions:  Do you have a fever, pain , or abdominal swelling? No. Pain Score  0 *  Have you tolerated food without any problems? Yes.    Have you been able to return to your normal activities? Yes.    Do you have any questions about your discharge instructions: Diet   No. Medications  No. Follow up visit  No.  Do you have questions or concerns about your Care? No.  Actions: * If pain score is 4 or above: No action needed, pain <4.

## 2014-05-11 IMAGING — CR DG CHEST 2V
2 series · 2 of 2 positions shown · non-contrast
Comparison: 11/06/2011

CLINICAL DATA: Follow-up left base opacity.  Shortness of breath,
asthma.

CHEST - 2 VIEW

[view not recorded (1 of 2)]
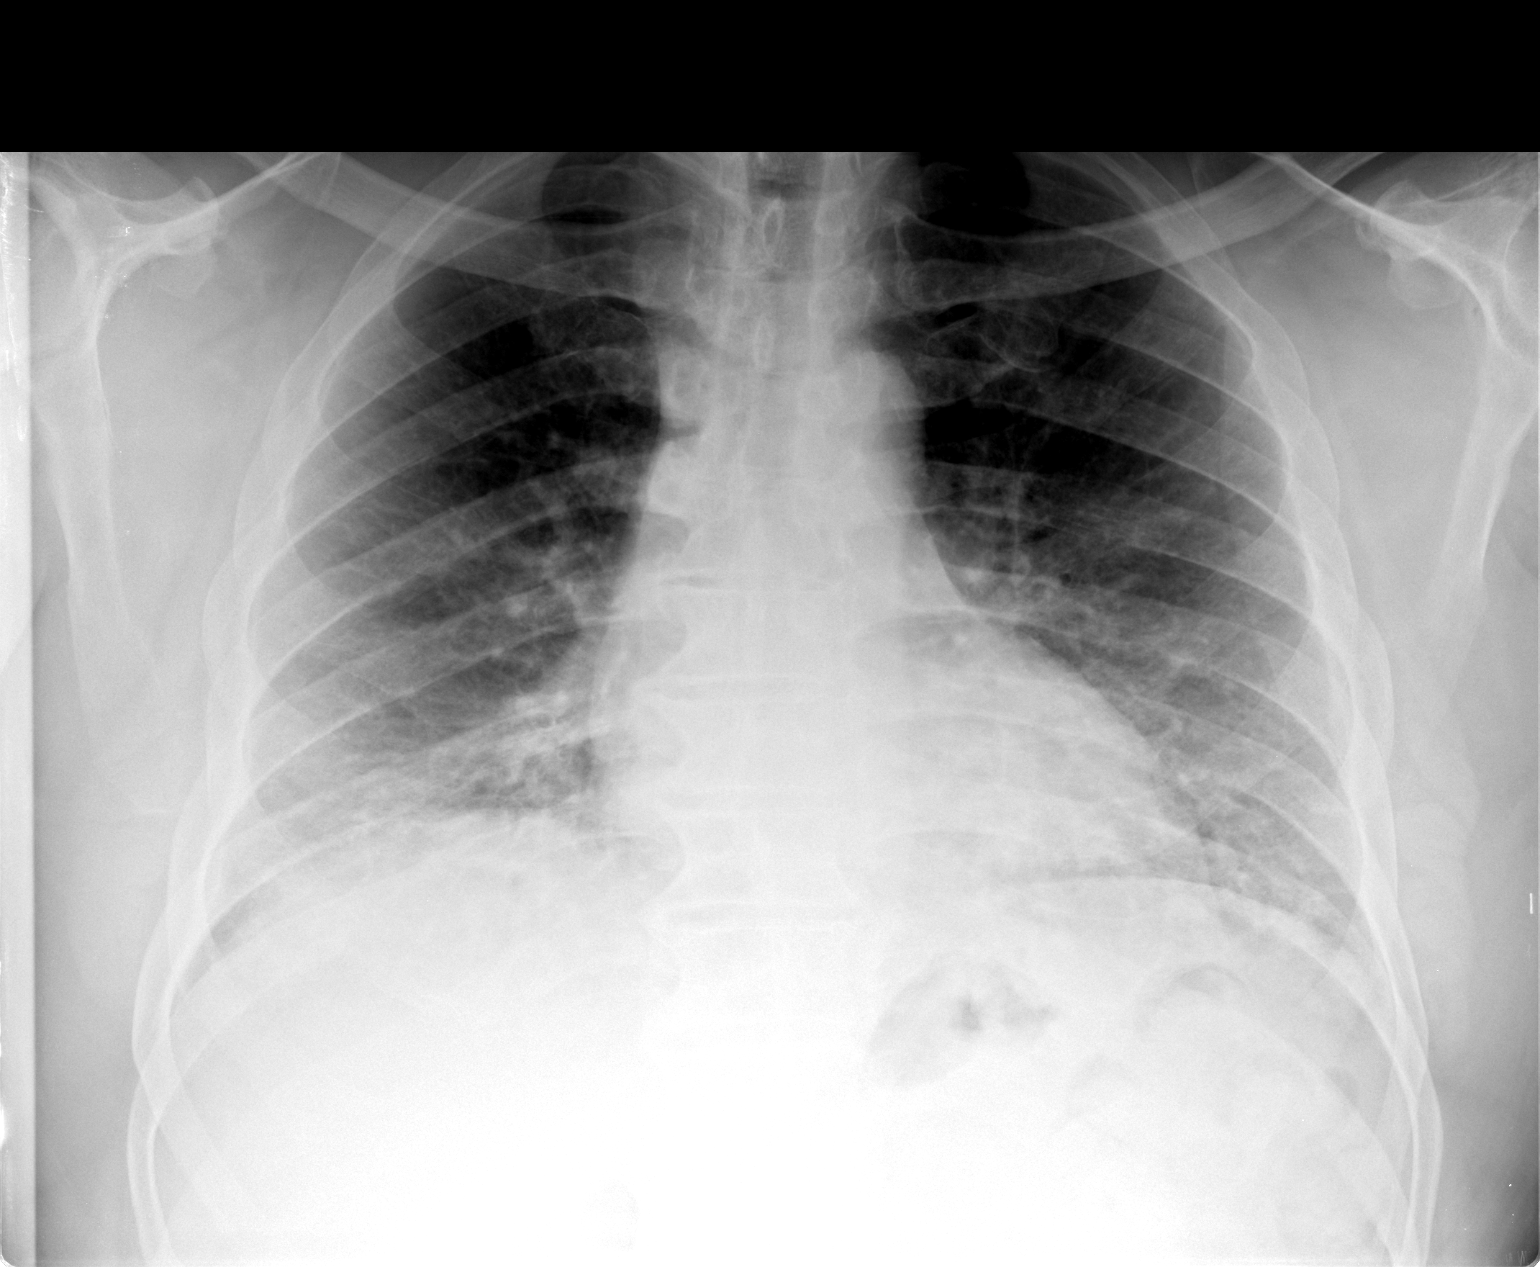

[view not recorded (2 of 2)]
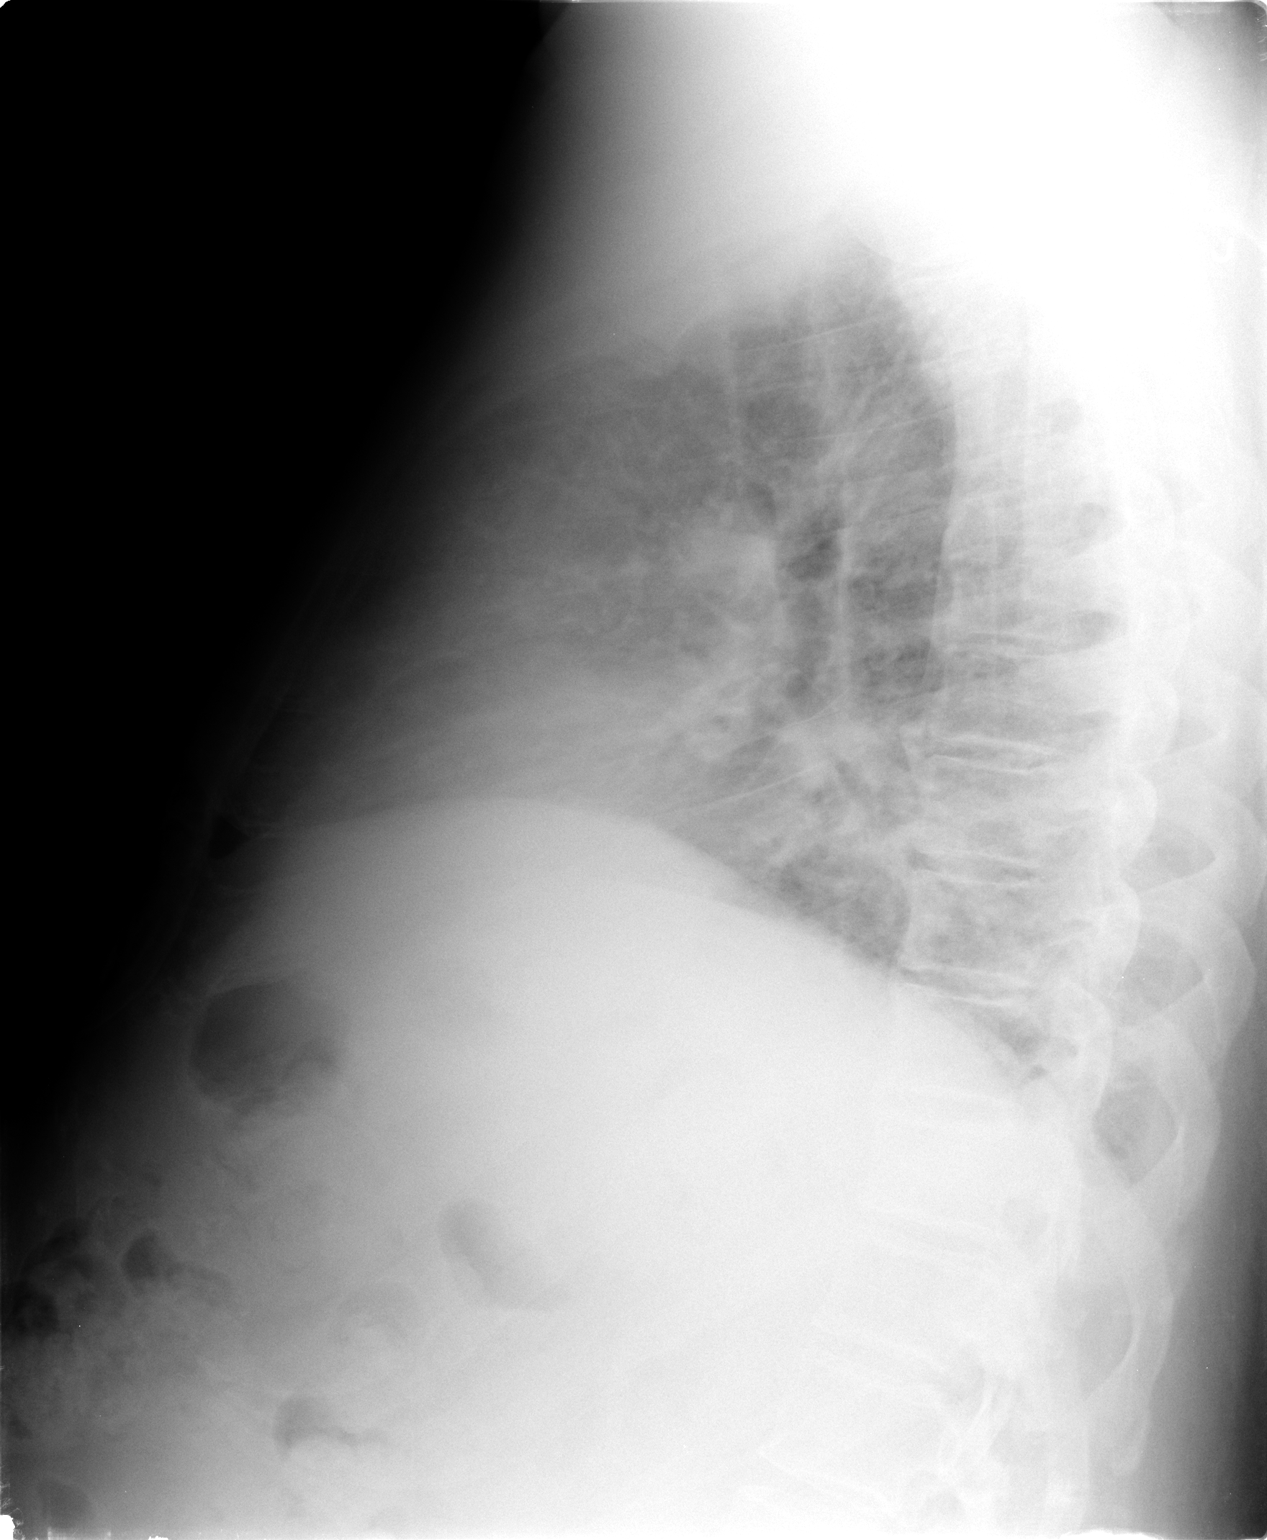

[2 of 2 positions shown; findings below may reference images not displayed]

FINDINGS: Bibasilar opacities are noted, unchanged on the left,
slightly increased on the right.  This could represent atelectasis
or infiltrates.  Heart is normal size.  No effusions.  No acute
bony abnormality.
IMPRESSION: Bibasilar atelectasis or infiltrates, stable on the left, increased
on the right.

## 2014-05-12 DIAGNOSIS — R309 Painful micturition, unspecified: Secondary | ICD-10-CM | POA: Diagnosis not present

## 2014-05-12 DIAGNOSIS — C61 Malignant neoplasm of prostate: Secondary | ICD-10-CM | POA: Diagnosis not present

## 2014-05-17 ENCOUNTER — Encounter: Payer: Self-pay | Admitting: Gastroenterology

## 2014-06-06 ENCOUNTER — Ambulatory Visit (INDEPENDENT_AMBULATORY_CARE_PROVIDER_SITE_OTHER)
Admission: RE | Admit: 2014-06-06 | Discharge: 2014-06-06 | Disposition: A | Discharge status: Home / Self Care | Payer: Medicare Other | Source: Ambulatory Visit | Attending: Pulmonary Disease | Admitting: Pulmonary Disease

## 2014-06-06 ENCOUNTER — Encounter: Payer: Self-pay | Admitting: Pulmonary Disease

## 2014-06-06 ENCOUNTER — Ambulatory Visit (INDEPENDENT_AMBULATORY_CARE_PROVIDER_SITE_OTHER): Payer: Medicare Other | Admitting: Pulmonary Disease

## 2014-06-06 VITALS — BP 140/86 | HR 96 | Ht 74.0 in | Wt 273.4 lb

## 2014-06-06 DIAGNOSIS — G4730 Sleep apnea, unspecified: Secondary | ICD-10-CM | POA: Diagnosis not present

## 2014-06-06 DIAGNOSIS — Z87891 Personal history of nicotine dependence: Secondary | ICD-10-CM | POA: Diagnosis not present

## 2014-06-06 DIAGNOSIS — M899 Disorder of bone, unspecified: Secondary | ICD-10-CM | POA: Diagnosis not present

## 2014-06-06 DIAGNOSIS — R918 Other nonspecific abnormal finding of lung field: Secondary | ICD-10-CM

## 2014-06-06 DIAGNOSIS — Z91041 Radiographic dye allergy status: Secondary | ICD-10-CM | POA: Diagnosis not present

## 2014-06-06 DIAGNOSIS — Z79899 Other long term (current) drug therapy: Secondary | ICD-10-CM | POA: Diagnosis not present

## 2014-06-06 DIAGNOSIS — C61 Malignant neoplasm of prostate: Secondary | ICD-10-CM | POA: Diagnosis not present

## 2014-06-06 DIAGNOSIS — R934 Abnormal findings on diagnostic imaging of urinary organs: Secondary | ICD-10-CM | POA: Diagnosis not present

## 2014-06-06 DIAGNOSIS — R591 Generalized enlarged lymph nodes: Secondary | ICD-10-CM | POA: Diagnosis not present

## 2014-06-06 DIAGNOSIS — R350 Frequency of micturition: Secondary | ICD-10-CM | POA: Diagnosis not present

## 2014-06-06 DIAGNOSIS — R309 Painful micturition, unspecified: Secondary | ICD-10-CM | POA: Diagnosis not present

## 2014-06-06 DIAGNOSIS — R599 Enlarged lymph nodes, unspecified: Secondary | ICD-10-CM | POA: Diagnosis not present

## 2014-06-06 DIAGNOSIS — R3915 Urgency of urination: Secondary | ICD-10-CM | POA: Diagnosis not present

## 2014-06-06 NOTE — Assessment & Plan Note (Signed)
The patient was having issues with low oxygen saturations on his pulse oximeter at rest, but was not having any shortness of breath or other symptoms. He has not had a flareup since he has been off of steroids, but with his diagnosis of BOOP, this is always a possibility. His lungs are totally clear today, and his saturations are 100%. Will check a chest x-ray for completeness, and I also asked him to consider getting a new pulse oximeter. He will let us know if he has increasing shortness of breath, cough, or recurring oxygen desaturations with a new device.

## 2014-06-06 NOTE — Patient Instructions (Signed)
Will check chest xray today to make sure everything ok.  Will call you with results.  Would consider getting a new pulse oximeter to make sure is accurate. Keep scheduled followup with me, but please call if you are having breathing issues.

## 2014-06-06 NOTE — Progress Notes (Signed)
   Subjective:    Patient ID: Chad [name]., male    DOB: [birth year], 64 y.o.   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 He was treated with a course of prednisone, and had resolution of his symptoms and infiltrates, and has done well off steroids. He comes in today noting periodic desaturation into the 80s at rest, with no associated pulmonary symptoms. He is able to exercise and do exertional activities without dyspnea. He has had no significant cough or mucus production.   Review of Systems  Constitutional: Negative for fever and unexpected weight change.  HENT: Negative for congestion, dental problem, ear pain, nosebleeds, postnasal drip, rhinorrhea, sinus pressure, sneezing, sore throat and trouble swallowing.   Eyes: Negative for redness and itching.  Respiratory: Positive for chest tightness and shortness of breath. Negative for cough and wheezing.   Cardiovascular: Negative for palpitations and leg swelling.  Gastrointestinal: Negative for nausea and vomiting.  Genitourinary: Negative for dysuria.  Musculoskeletal: Negative for joint swelling.  Skin: Negative for rash.  Neurological: Negative for headaches.  Hematological: Does not bruise/bleed easily.  Psychiatric/Behavioral: Negative for dysphoric mood. The patient is not nervous/anxious.        Objective:   Physical Exam Overweight male in no acute distress Nose without purulence or discharge noted Neck without lymphadenopathy or thyromegaly Chest totally clear to auscultation, no wheezing Cardiac exam with regular rate and rhythm Lower extremities with minimal ankle edema, no cyanosis Alert and oriented, moves all 4 extremities.       Assessment & Plan:

## 2014-06-07 ENCOUNTER — Encounter: Payer: Self-pay | Admitting: Pulmonary Disease

## 2014-06-07 NOTE — Telephone Encounter (Signed)
Dr Gwenette Greet please advise on patient e-mail (see below)  To: [place]   From: Rudean Haskell.    Created: 06/07/2014 11:23 AM   Received a call from your nurse this morning stating that my condition is stable. Does this mean that there is no change or slight change in my condition or what? Because when I login to MyChart I see information that I haven't seen before such as: Scattered spur formation, Minimal Bibasilar chronic Atelectasis versus Scarring. I would like an explanation about what this mean and is this something that has occurred since my last visit?   Thanks!

## 2014-06-07 NOTE — Telephone Encounter (Signed)
Let pt know that when we say "stable", that means your current cxr is completely the same when compared to the last.  NO DIFFERENCE. He has some scarring on his lung related to his prior inflammatory process.  That is not new, and is totally the same as the last cxr.

## 2014-06-08 ENCOUNTER — Ambulatory Visit (INDEPENDENT_AMBULATORY_CARE_PROVIDER_SITE_OTHER): Payer: Medicare Other | Admitting: Family Medicine

## 2014-06-08 ENCOUNTER — Encounter: Payer: Self-pay | Admitting: Family Medicine

## 2014-06-08 VITALS — BP 118/78 | HR 95 | Temp 98.6°F | Ht 74.0 in | Wt 276.2 lb

## 2014-06-08 DIAGNOSIS — J069 Acute upper respiratory infection, unspecified: Secondary | ICD-10-CM | POA: Diagnosis not present

## 2014-06-08 MED ORDER — HYDROCODONE-HOMATROPINE 5-1.5 MG/5ML PO SYRP: 5.0000 mL | ORAL_SOLUTION | Freq: Three times a day (TID) | ORAL | Status: DC | PRN

## 2014-06-08 NOTE — Patient Instructions (Addendum)
INSTRUCTIONS FOR UPPER RESPIRATORY INFECTION:  -plenty of rest and fluids  -nasal saline wash 2-3 times daily (use prepackaged nasal saline or bottled/distilled water if making your own)   -clean nose with nasal saline before using the nasal steroid or sinex  -can use AFRIN nasal spray for drainage and nasal congestion - but do NOT use longer then 3-4 days  -can use tylenol or ibuprofen as directed for aches and sorethroat  -in the winter time, using a humidifier at night is helpful (please follow cleaning instructions)  -if you are taking a cough medication - use only as directed, may also try a teaspoon of honey to coat the throat and throat lozenges  -for sore throat, salt water gargles can help  -follow up if you have fevers, facial pain, tooth pain, difficulty breathing or are worsening or not getting better as expected

## 2014-06-08 NOTE — Progress Notes (Signed)
HPI:  URI: -started:3 days ago -symptoms:clear nasal congestion, sore throat, cough, PND, sneezing -denies:fever, SOB, NVD, tooth pain -has tried: ibuprofen, tylenol -sick contacts/travel/risks: denies flu exposure, tick exposure or or Ebola risks -Hx of: allergies - he does not take anything for this  ROS: See pertinent positives and negatives per HPI.  Past Medical History  Diagnosis Date  . Prostate cancer 06/09/12 bx    Adenocarcinoma  . Arthritis   . OSA on CPAP   . Chronic interstitial lung disease     PULMONARY INFILTRATE  --  PULMOLOGIST-  DR CLANCE  . Chronic steroid use     INTERSTITIAL LUNG DISEASE  . Dyspnea on exertion   . History of anal fissures   . History of hypothyroidism   . Complication of anesthesia     EMERGENCE COMBATIVENESS---  PLEASE REFER TO VATS 01-05-1012 PROCEDURE ,  DR Linna Caprice DOCUMENTED GRADE IV DIFFICULT VISUAL AIRWAY  . Sleep apnea     on cpap    Past Surgical History  Procedure Laterality Date  . Mediastinoscopy  07-12-2007    BILATERAL PLEURAL EFFUSIONS  . Nasal sinus surgery    . Prostate biopsy  06/11/12    Adenocarcinoma  . Video assisted thoracoscopy (vats)/ lymph node sampling Left 01-05-2012    LUNG AND LYMPH NODE BX'S (CHRONIC INTERSTITIAL PNEUMONIA)  . Cardiovascular stress test  01-10-2008    NORMAL EXERCISE STRESS TEST AT GOOD WORKLOAD  . Transthoracic echocardiogram  12-31-2011    NORMAL LVF/   EF  55-60%  . Radioactive seed implant N/A 12/23/2012    Procedure: RADIOACTIVE SEED IMPLANT;  Surgeon: Franchot Gallo, MD;  Location[facility];  Service: Urology;  Laterality: N/A;   81   seeds implanted   . Colonoscopy  01-15-2009    polyps and tics  . Lung biopsy    . Polypectomy      Family History  Problem Relation Age of Onset  . Heart attack Father   . Prostate cancer Father     seed implant  . Nephrolithiasis Father   . Colon cancer Father     passed 08-2013  . Diabetes Daughter   . Cancer  Paternal Uncle     History   Social History  . Marital Status: Married    Spouse Name: N/A  . Number of Children: 1  . Years of Education: N/A   Occupational History  . [employer]     part time   Social History Main Topics  . Smoking status: Former Smoker -- 1.00 packs/day for 2 years    Types: Cigarettes, Cigars    Quit date: 04/17/1986  . Smokeless tobacco: Never Used  . Alcohol Use: 1.2 oz/week    2 Cans of beer per week     Comment: occ  . Drug Use: No  . Sexual Activity: Not on file   Other Topics Concern  . None   Social History Narrative   Patient states former smoker   Retired from post office   Part time Art gallery manager   Married (2nd)   Daughter from 1st marriage     Current outpatient prescriptions:  .  Ibuprofen (ADVIL) 200 MG CAPS, Take by mouth as needed., Disp: , Rfl:  .  silodosin (RAPAFLO) 8 MG CAPS capsule, Take 8 mg by mouth daily with breakfast., Disp: , Rfl:  .  HYDROcodone-homatropine (HYCODAN) 5-1.5 MG/5ML syrup, Take 5 mLs by mouth every 8 (eight) hours as needed for cough., Disp: 120 mL,  Rfl: 0  EXAM:  Filed Vitals:   06/08/14 1607  BP: 118/78  Pulse: 95  Temp: 98.6 F (37 C)    Body mass index is 35.45 kg/(m^2).  GENERAL: vitals reviewed and listed above, alert, oriented, appears well hydrated and in no acute distress  HEENT: atraumatic, conjunttiva clear, no obvious abnormalities on inspection of external nose and ears, normal appearance of ear canals and TMs, clear nasal congestion, mild post oropharyngeal erythema with PND, no tonsillar edema or exudate, no sinus TTP  NECK: no obvious masses on inspection  LUNGS: clear to auscultation bilaterally, no wheezes, rales or rhonchi, good air movement  CV: HRRR, no peripheral edema  MS: moves all extremities without noticeable abnormality  PSYCH: pleasant and cooperative, no obvious depression or anxiety  ASSESSMENT AND PLAN:  Discussed the following assessment and plan:  Acute upper  respiratory infection - Plan: HYDROcodone-homatropine (HYCODAN) 5-1.5 MG/5ML syrup  -given HPI and exam findings today, a serious infection or illness is unlikely. We discussed potential etiologies, with VURI being most likely, and advised supportive care and monitoring. We discussed treatment side effects, likely course, antibiotic misuse, transmission, and signs of developing a serious illness. -risk of cough med discussed - he feels this really helps with sleep -strict return precaution given hx lung disease -of course, we advised to return or notify a doctor immediately if symptoms worsen or persist or new concerns arise.    Patient Instructions  INSTRUCTIONS FOR UPPER RESPIRATORY INFECTION:  -plenty of rest and fluids  -nasal saline wash 2-3 times daily (use prepackaged nasal saline or bottled/distilled water if making your own)   -clean nose with nasal saline before using the nasal steroid or sinex  -can use AFRIN nasal spray for drainage and nasal congestion - but do NOT use longer then 3-4 days  -can use tylenol or ibuprofen as directed for aches and sorethroat  -in the winter time, using a humidifier at night is helpful (please follow cleaning instructions)  -if you are taking a cough medication - use only as directed, may also try a teaspoon of honey to coat the throat and throat lozenges  -for sore throat, salt water gargles can help  -follow up if you have fevers, facial pain, tooth pain, difficulty breathing or are worsening or not getting better as expected      Joseph Bias, Jarrett Soho R.

## 2014-06-08 NOTE — Progress Notes (Signed)
Pre visit review using our clinic review tool, if applicable. No additional management support is needed unless otherwise documented below in the visit note. 

## 2014-06-13 ENCOUNTER — Telehealth: Payer: Self-pay | Admitting: Internal Medicine

## 2014-06-13 NOTE — Telephone Encounter (Signed)
Patient informed and stated he only wanted a medication to be called in for the dark green sputum he has-denies a fever or breathing problems.  Offered an appt twice and he declines, stated he lives in [place].

## 2014-06-13 NOTE — Telephone Encounter (Signed)
Green mucus is usually normal with a cold - advised cough drops and treatments suggested at appointment. If feeling worse, fevers, SOB, persistent sinus pain or concerned advise appointment.

## 2014-06-13 NOTE — Telephone Encounter (Signed)
Patient was seen on 06/08/14 and has been coughing up green mucus since Saturday.  He would like to know if Dr. Maudie Mercury can prescribe something else? Please advise.  [id number] - [place], [place] - [address]

## 2014-06-19 DIAGNOSIS — C61 Malignant neoplasm of prostate: Secondary | ICD-10-CM | POA: Diagnosis not present

## 2014-06-28 DIAGNOSIS — C61 Malignant neoplasm of prostate: Secondary | ICD-10-CM | POA: Diagnosis not present

## 2014-06-28 DIAGNOSIS — R309 Painful micturition, unspecified: Secondary | ICD-10-CM | POA: Diagnosis not present

## 2014-07-17 ENCOUNTER — Telehealth: Payer: Self-pay | Admitting: Pulmonary Disease

## 2014-07-17 NOTE — Telephone Encounter (Signed)
Most likely this is a CMN and I do not. Please advise Alida thanks

## 2014-07-17 NOTE — Telephone Encounter (Signed)
Spoke with pt, states he had requested cpap supplies on 4/7 through [place]- states that [name] is faxing documents over to our office and these are not being received.  Mindy do you have any forms on this patient, or does this need to be re-requested from [place]?  Thanks!

## 2014-07-18 DIAGNOSIS — J209 Acute bronchitis, unspecified: Secondary | ICD-10-CM | POA: Diagnosis not present

## 2014-07-18 NOTE — Telephone Encounter (Signed)
We have nothing for this patient, called [facility] who says they need ov note and copy of sleep study for medicare, to get patient supplies, I asked Helanna to send request form as we do not have anything. She says she will fax.  Will inform patient of status. Verdie Mosher  We have received and faxing to [place] .Verdie Mosher

## 2014-07-18 NOTE — Telephone Encounter (Signed)
Alida please advise if you have this CMN.  Thanks!

## 2014-07-27 ENCOUNTER — Encounter: Payer: Self-pay | Admitting: Pulmonary Disease

## 2014-07-27 NOTE — Telephone Encounter (Signed)
Per pt advise request sent in: [employer] needs a copy of my Sleep Study with the doctor's signature so they can order CPAP supplies. This is required because Medicare is now my primary insurance and they need this documentation so it can be approved. They will not ship anything until then. Please fax this Info to:  AttnHuey Romans ID# [id number] Lolly Mustache, Brooke Bonito. Fax: [phone] --  PCC's please advise thanks

## 2014-08-10 NOTE — Telephone Encounter (Signed)
Need to call [facility] in AM to check on this. They are now closed

## 2014-08-11 NOTE — Telephone Encounter (Signed)
Called and spoke with Arbie Cookey at [facility]. She looked into this for me. States that pt's Medicare effective date is 03/31/14. Pt has not had a face to face with [facility] since he join Medicare. He will need ROV with either [place] or [facility] soon. lmtcb x1 for pt to call back.

## 2014-08-15 ENCOUNTER — Telehealth: Payer: Self-pay | Admitting: Pulmonary Disease

## 2014-08-15 DIAGNOSIS — G4733 Obstructive sleep apnea (adult) (pediatric): Secondary | ICD-10-CM

## 2014-08-15 NOTE — Telephone Encounter (Signed)
Ok to send order for supplies, and keep pressure the same as before.

## 2014-08-15 NOTE — Telephone Encounter (Signed)
Spoke with pt, states [name] has not received an order for cpap supplies.  I do not see where an order for supplies was recently placed, although it was discussed through patient email on 4/28.  Spoke with Arbie Cookey at [facility], states they need last ov note with [place] and new rx for cpap per medicare guidelines with items, dx code, provider npi, length of time needed, and dr signature.    [phone] attn: [facility].    KC the last order sent through [facility] for pt's cpap was auto 5-20.  Is this how you want the pt to continue his cpap, or has his treatment changed?  Thanks!

## 2014-08-15 NOTE — Telephone Encounter (Signed)
Spoke with pt. States he received a message from [place]. I have looked through the pt's chart, I do not see where Mindy contacted the pt.  Mindy - did you need to speak to this patient?

## 2014-08-15 NOTE — Telephone Encounter (Signed)
I have not tried calling patient. He had sent a patient email back on 07/27/14. thanks

## 2014-08-15 NOTE — Telephone Encounter (Signed)
New cpap order placed.  Nothing further needed.

## 2014-08-24 ENCOUNTER — Telehealth: Payer: Self-pay | Admitting: *Deleted

## 2014-08-24 ENCOUNTER — Encounter: Payer: Self-pay | Admitting: Pulmonary Disease

## 2014-08-24 NOTE — Telephone Encounter (Signed)
Per pt email sent in today: "Hi! Chad [name]   I've received 2 calls from [place] since the last time we talked regarding the documentation needed to process my order for CPAP supplies. They're saying that they have contacted Dr. Janifer Adie office and have been unsuccessful in obtaining the necessary documentation and the order cannot be filled.   Have you talked with anyone from [facility], if so what seems to be the problem" ---  Pt has been emailing with this same problem for the past month. Will forward to PCC's to assit with help. Please advise thanks

## 2014-08-25 NOTE — Telephone Encounter (Signed)
Check with [name] and [name] has faxed form over to be signed, however, Dr. Gwenette Greet hasn't signed the form yet. [name] is checking with Dr. Gwenette Greet to see if he will sign form today. Chad [name]

## 2014-08-29 NOTE — Telephone Encounter (Signed)
I found the form and asked Dr. Gwenette Greet to sign. I faxed on 08/25/2014 to [facility]

## 2014-09-11 ENCOUNTER — Telehealth: Payer: Self-pay | Admitting: *Deleted

## 2014-09-11 ENCOUNTER — Encounter: Payer: Self-pay | Admitting: Pulmonary Disease

## 2014-09-11 NOTE — Telephone Encounter (Signed)
Last ov for osa was 01/25/14 pt went on medicare 03/31/14 medicare is asking for notes since pt has been on medicare stating pt is using cpap and is compliant with it i think this pt will need an ov and in the meantime we could send him to sleep center for a mask fit Joellen Jersey

## 2014-09-11 NOTE — Telephone Encounter (Signed)
Left message for patient to call back  

## 2014-09-11 NOTE — Telephone Encounter (Signed)
Per pt advice request sent today: hate to keep bothering you but I'm in need of headgear for my CPAP machine. Have you all located my records yet? If not, Who can I contact where the sleep study was done. This is getting to be a little ridiculous because it's at the point where I'm unable to use my machine. [facility] keeps contacting me to let me know they haven't received the documentation they need to process my order. Please let me know what's the problem.  --  Per previous phone note Chad [name] was working on this. Please advise thanks

## 2014-09-12 NOTE — Telephone Encounter (Signed)
Chad [name] has scheduled appointment with [facility]. Nothing further is needed.

## 2014-09-12 NOTE — Telephone Encounter (Signed)
lmtcb X2 for pt- per Mindy pt needs to be scheduled with [facility].

## 2014-09-25 ENCOUNTER — Other Ambulatory Visit: Payer: Self-pay

## 2014-11-15 ENCOUNTER — Ambulatory Visit (INDEPENDENT_AMBULATORY_CARE_PROVIDER_SITE_OTHER): Payer: Medicare Other | Admitting: Pulmonary Disease

## 2014-11-15 ENCOUNTER — Encounter: Payer: Self-pay | Admitting: Pulmonary Disease

## 2014-11-15 VITALS — BP 122/84 | HR 84 | Ht 74.0 in | Wt 278.8 lb

## 2014-11-15 DIAGNOSIS — G4733 Obstructive sleep apnea (adult) (pediatric): Secondary | ICD-10-CM | POA: Diagnosis not present

## 2014-11-15 DIAGNOSIS — Z9989 Dependence on other enabling machines and devices: Principal | ICD-10-CM

## 2014-11-15 NOTE — Progress Notes (Signed)
Chief Complaint  Patient presents with  . Follow-up    Former [place] pt: Pt denies any increased breathing issues since last seen. Wears CPAP nightly. Needs new head gear and supplies. Having trouble with Medicare getting supplies.     History of Present Illness: Chad [name]. is a 64 y.o. male former smoker with OSA.  He was previously seen by Dr. Gwenette Greet.  He has been doing well with CPAP.  He has nasal mask.  His straps are wearing out.  He was told he needed doctor visit before getting new mask.  His machine is more than 65 yrs old.  He sleeps about 6 hrs per night and uses CPAP whole time.  He naps few times per week for about 1 to 2 hours > does not use CPAP during naps.   TESTS: PFT 12/30/12 >> FEV1 2.33 (68%), FEV1% 89, TLC 3.25 (42%), DLCO 44%, no BD   Past medical hx >> Prostate cancer, Hypothyroidism, BOOP 2014 s/p VATs lung bx  Past surgical hx, Medications, Allergies, Family hx, Social hx all reviewed.   Physical Exam: BP 122/84 mmHg  Pulse 84  Ht 6\' 2"  (1.88 m)  Wt 278 lb 12.8 oz (126.463 kg)  BMI 35.78 kg/m2  SpO2 98%  General - No distress ENT - No sinus tenderness, no oral exudate, no LAN, MP 3 Cardiac - s1s2 regular, no murmur Chest - No wheeze/rales/dullness Back - No focal tenderness Abd - Soft, non-tender Ext - No edema Neuro - Normal strength Skin - No rashes Psych - normal mood, and behavior   Assessment/Plan:  Obstructive sleep apnea. Plan: - will arrange for new CPAP machine and supplies - advised him to use CPAP whenever he is asleep, including naps - will get download 1 month after he gets new auto CPAP machine  Obesity. Plan: - discussed importance of weight loss   Chesley Mires, MD [facility]/Sleep Pager:  [phone]

## 2014-11-15 NOTE — Patient Instructions (Signed)
Will arrange for new CPAP machine and supplies  Follow up in 1 year

## 2014-12-14 DIAGNOSIS — I700 Atherosclerosis of aorta: Secondary | ICD-10-CM | POA: Diagnosis not present

## 2014-12-14 DIAGNOSIS — C61 Malignant neoplasm of prostate: Secondary | ICD-10-CM | POA: Diagnosis not present

## 2014-12-14 DIAGNOSIS — J984 Other disorders of lung: Secondary | ICD-10-CM | POA: Diagnosis not present

## 2014-12-14 DIAGNOSIS — K5730 Diverticulosis of large intestine without perforation or abscess without bleeding: Secondary | ICD-10-CM | POA: Diagnosis not present

## 2014-12-14 DIAGNOSIS — R300 Dysuria: Secondary | ICD-10-CM | POA: Diagnosis not present

## 2014-12-14 DIAGNOSIS — R309 Painful micturition, unspecified: Secondary | ICD-10-CM | POA: Diagnosis not present

## 2014-12-14 DIAGNOSIS — J479 Bronchiectasis, uncomplicated: Secondary | ICD-10-CM | POA: Diagnosis not present

## 2015-01-09 DIAGNOSIS — C61 Malignant neoplasm of prostate: Secondary | ICD-10-CM | POA: Diagnosis not present

## 2015-01-09 DIAGNOSIS — R300 Dysuria: Secondary | ICD-10-CM | POA: Diagnosis not present

## 2015-01-25 ENCOUNTER — Ambulatory Visit (INDEPENDENT_AMBULATORY_CARE_PROVIDER_SITE_OTHER): Payer: Medicare Other | Admitting: Pulmonary Disease

## 2015-01-25 ENCOUNTER — Encounter: Payer: Self-pay | Admitting: Pulmonary Disease

## 2015-01-25 VITALS — BP 124/88 | HR 88 | Temp 98.1°F | Ht 74.0 in | Wt 278.0 lb

## 2015-01-25 DIAGNOSIS — Z9989 Dependence on other enabling machines and devices: Secondary | ICD-10-CM

## 2015-01-25 DIAGNOSIS — Z87891 Personal history of nicotine dependence: Secondary | ICD-10-CM

## 2015-01-25 DIAGNOSIS — G4733 Obstructive sleep apnea (adult) (pediatric): Secondary | ICD-10-CM

## 2015-01-25 DIAGNOSIS — Z23 Encounter for immunization: Secondary | ICD-10-CM | POA: Diagnosis not present

## 2015-01-25 DIAGNOSIS — R0609 Other forms of dyspnea: Secondary | ICD-10-CM | POA: Diagnosis not present

## 2015-01-25 DIAGNOSIS — R0600 Dyspnea, unspecified: Secondary | ICD-10-CM

## 2015-01-25 DIAGNOSIS — J8489 Other specified interstitial pulmonary diseases: Secondary | ICD-10-CM

## 2015-01-25 NOTE — Patient Instructions (Signed)
Will schedule CT chest and pulmonary function tests and call with results  Follow up in 1 year

## 2015-01-25 NOTE — Progress Notes (Signed)
Chief Complaint  Patient presents with  . Follow-up    Pt following for OSA: pt states he is having increase SOB with exertion. pt states he is taking more naps than usual. pt using CPAP every night for about 5 - 7 hours a night. MAsk and pressure well for pt. DME: [facility]     History of Present Illness: Chad [name]. is a 64 y.o. male former smoker with OSA and pulmonary infiltrates s/p VATS bx in 2013.  He has been concerned about his breathing.  He gets winded with activity.  He gets occasional cough with clear sputum.  He feels tired.  He denies sinus congestion, sore throat, difficulty swallowing, joint pain/swelling, skin rash, fever, or GI symptoms.  He has been doing well with CPAP.  He uses his machine every night.   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  Average AHI is 0.9 with median CPAP 6 cm H2O and 95 th percentile CPAP 9 cm H20.   Past medical hx >> Prostate cancer, Hypothyroidism, Pulmonary infiltrates 2013 s/p VATs lung bx  Past surgical hx, Medications, Allergies, Family hx, Social hx all reviewed.   Physical Exam: BP 124/88 mmHg  Pulse 88  Temp(Src) 98.1 F (36.7 C) (Oral)  Ht 6' 2"  (1.88 m)  Wt 278 lb (126.1 kg)  BMI 35.68 kg/m2  SpO2 98%  General - No distress ENT - No sinus tenderness, no oral exudate, no LAN, MP 3 Cardiac - s1s2 regular, no murmur Chest - No wheeze/rales/dullness Back - No focal tenderness Abd - Soft, non-tender Ext - No edema Neuro - Normal strength Skin - No rashes Psych - normal mood, and behavior   Assessment/Plan:  Obstructive sleep  apnea. He is compliant with CPAP and reports benefit from therapy. Plan: - continue auto CPAP  Obesity. Plan: - discussed importance of weight loss  Hx of pulmonary infiltrates in 2013 with persistent dyspnea and prior hx of smoking Plan: - will repeat PFT and high resolution CT chest to further assess - flu shot today   Chesley Mires, MD [place] [facility] Pager:  [phone]

## 2015-01-30 ENCOUNTER — Ambulatory Visit: Payer: PRIVATE HEALTH INSURANCE | Admitting: Pulmonary Disease

## 2015-01-31 ENCOUNTER — Ambulatory Visit: Payer: Medicare Other | Admitting: Pulmonary Disease

## 2015-01-31 ENCOUNTER — Ambulatory Visit (INDEPENDENT_AMBULATORY_CARE_PROVIDER_SITE_OTHER)
Admission: RE | Admit: 2015-01-31 | Discharge: 2015-01-31 | Disposition: A | Discharge status: Home / Self Care | Payer: Medicare Other | Source: Ambulatory Visit | Attending: Pulmonary Disease | Admitting: Pulmonary Disease

## 2015-01-31 DIAGNOSIS — R0600 Dyspnea, unspecified: Secondary | ICD-10-CM

## 2015-01-31 DIAGNOSIS — J8489 Other specified interstitial pulmonary diseases: Secondary | ICD-10-CM | POA: Diagnosis not present

## 2015-01-31 DIAGNOSIS — J189 Pneumonia, unspecified organism: Secondary | ICD-10-CM | POA: Diagnosis not present

## 2015-02-01 ENCOUNTER — Other Ambulatory Visit: Payer: Medicare Other

## 2015-02-05 ENCOUNTER — Telehealth: Payer: Self-pay | Admitting: Pulmonary Disease

## 2015-02-05 DIAGNOSIS — H04123 Dry eye syndrome of bilateral lacrimal glands: Secondary | ICD-10-CM | POA: Diagnosis not present

## 2015-02-05 DIAGNOSIS — H11153 Pinguecula, bilateral: Secondary | ICD-10-CM | POA: Diagnosis not present

## 2015-02-05 NOTE — Telephone Encounter (Signed)
lmtcb

## 2015-02-05 NOTE — Telephone Encounter (Signed)
HRCT 01/31/15 >> mild scarring   Will have my nurse inform pt that CT chest did now show any signs of BOOP or interstitial lung disease.  No other worrisome findings on CT chest.  Will call him back after review of his PFTs.

## 2015-02-06 MED ORDER — AZITHROMYCIN 250 MG PO TABS: ORAL_TABLET | ORAL | Status: DC

## 2015-02-06 NOTE — Telephone Encounter (Signed)
Results have been explained to patient, pt expressed understanding.   Pt c/o runny nose, sneezing, chills and low grade fever. Denies any cough, wheezing and SOB Pt reports having flu vaccine about 2 weeks ago.  Pt states that his PCP is out of office and is needing something called in.  Please advise Dr Lenna Gilford of any rec's in Dr Juanetta Gosling absence. Thanks.  Allergies  Allergen Reactions  . Ivp Dye [Iodinated Diagnostic Agents] Anaphylaxis    Coma for a day in '09  . Lactose Intolerance (Gi) Other (See Comments)

## 2015-02-06 NOTE — Telephone Encounter (Signed)
Patient Returned call [phone]

## 2015-02-06 NOTE — Telephone Encounter (Signed)
lmtcb for pt.  

## 2015-02-06 NOTE — Telephone Encounter (Signed)
Per SN: Zpak.  Called and spoke to pt. Informed him of the recs per SN. Rx sent to preferred pharmacy. Pt verbalized understanding and denied any further questions or concerns at this time.

## 2015-02-14 ENCOUNTER — Other Ambulatory Visit: Payer: Medicare Other

## 2015-02-21 ENCOUNTER — Encounter: Payer: Medicare Other | Admitting: Internal Medicine

## 2015-02-21 ENCOUNTER — Ambulatory Visit (INDEPENDENT_AMBULATORY_CARE_PROVIDER_SITE_OTHER): Payer: Medicare Other | Admitting: Pulmonary Disease

## 2015-02-21 DIAGNOSIS — J8489 Other specified interstitial pulmonary diseases: Secondary | ICD-10-CM | POA: Diagnosis not present

## 2015-02-21 DIAGNOSIS — R0600 Dyspnea, unspecified: Secondary | ICD-10-CM

## 2015-02-21 LAB — PULMONARY FUNCTION TEST
DL/VA % pred: 113 %
DL/VA: 5.43 ml/min/mmHg/L
DLCO unc % pred: 57 %
DLCO unc: 20.98 ml/min/mmHg
FEF 25-75 PRE: 2.72 L/s
FEF 25-75 Post: 3.46 L/sec
FEF2575-%Change-Post: 27 %
FEF2575-%Pred-Post: 116 %
FEF2575-%Pred-Pre: 92 %
FEV1-%CHANGE-POST: 3 %
FEV1-%PRED-POST: 72 %
FEV1-%Pred-Pre: 69 %
FEV1-POST: 2.43 L
FEV1-PRE: 2.35 L
FEV1FVC-%CHANGE-POST: 8 %
FEV1FVC-%PRED-PRE: 105 %
FEV6-%Change-Post: -4 %
FEV6-%Pred-Post: 65 %
FEV6-%Pred-Pre: 68 %
FEV6-POST: 2.75 L
FEV6-Pre: 2.87 L
FEV6FVC-%Change-Post: 0 %
FEV6FVC-%PRED-POST: 103 %
FEV6FVC-%PRED-PRE: 103 %
FVC-%Change-Post: -4 %
FVC-%PRED-PRE: 66 %
FVC-%Pred-Post: 63 %
FVC-POST: 2.77 L
FVC-PRE: 2.89 L
POST FEV1/FVC RATIO: 88 %
POST FEV6/FVC RATIO: 99 %
PRE FEV1/FVC RATIO: 81 %
Pre FEV6/FVC Ratio: 99 %
RV % PRED: 48 %
RV: 1.24 L
TLC % pred: 53 %
TLC: 4.05 L

## 2015-02-21 NOTE — Progress Notes (Signed)
PFT done today. 

## 2015-02-26 ENCOUNTER — Telehealth: Payer: Self-pay | Admitting: Pulmonary Disease

## 2015-02-26 NOTE — Telephone Encounter (Signed)
Results have been explained to patient, pt expressed understanding. Nothing further needed.  

## 2015-02-26 NOTE — Telephone Encounter (Signed)
PFT 02/21/15 >> FEV1 2.43 (72%), FEV1% 88, TLC 4.05 (53%), DLCO 57%, no BD PFT 12/30/12 >> FEV1 2.33 (68%), FEV1% 89, TLC 3.25 (42%), DLCO 44%, no BD   Will have my nurse inform pt that PFT results have improved since previous test in 2014.  No change to current treatment plan.

## 2015-02-26 NOTE — Telephone Encounter (Signed)
LMTCB x 1 

## 2015-03-07 ENCOUNTER — Encounter: Payer: Self-pay | Admitting: Gastroenterology

## 2015-04-27 ENCOUNTER — Encounter: Payer: Medicare Other | Admitting: Internal Medicine

## 2015-05-09 ENCOUNTER — Ambulatory Visit (INDEPENDENT_AMBULATORY_CARE_PROVIDER_SITE_OTHER): Payer: Medicare Other | Admitting: Family Medicine

## 2015-05-09 ENCOUNTER — Encounter: Payer: Medicare Other | Admitting: Internal Medicine

## 2015-05-09 VITALS — BP 120/80 | HR 86 | Temp 98.4°F | Ht 72.5 in | Wt 279.4 lb

## 2015-05-09 DIAGNOSIS — Z23 Encounter for immunization: Secondary | ICD-10-CM

## 2015-05-09 DIAGNOSIS — Z0000 Encounter for general adult medical examination without abnormal findings: Secondary | ICD-10-CM | POA: Diagnosis not present

## 2015-05-09 DIAGNOSIS — C61 Malignant neoplasm of prostate: Secondary | ICD-10-CM

## 2015-05-09 DIAGNOSIS — D696 Thrombocytopenia, unspecified: Secondary | ICD-10-CM | POA: Diagnosis not present

## 2015-05-09 DIAGNOSIS — R7309 Other abnormal glucose: Secondary | ICD-10-CM

## 2015-05-09 DIAGNOSIS — E785 Hyperlipidemia, unspecified: Secondary | ICD-10-CM | POA: Diagnosis not present

## 2015-05-09 LAB — CBC WITH DIFFERENTIAL/PLATELET
Basophils Absolute: 0 K/uL (ref 0.0–0.1)
Basophils Relative: 0.4 % (ref 0.0–3.0)
Eosinophils Absolute: 0.7 K/uL (ref 0.0–0.7)
Eosinophils Relative: 8.6 % — ABNORMAL HIGH (ref 0.0–5.0)
HCT: 41 % (ref 39.0–52.0)
Hemoglobin: 13.5 g/dL (ref 13.0–17.0)
Lymphocytes Relative: 33.6 % (ref 12.0–46.0)
Lymphs Abs: 2.7 K/uL (ref 0.7–4.0)
MCHC: 32.8 g/dL (ref 30.0–36.0)
MCV: 89 fl (ref 78.0–100.0)
Monocytes Absolute: 0.5 K/uL (ref 0.1–1.0)
Monocytes Relative: 6.5 % (ref 3.0–12.0)
Neutro Abs: 4 K/uL (ref 1.4–7.7)
Neutrophils Relative %: 50.9 % (ref 43.0–77.0)
Platelets: 156 K/uL (ref 150.0–400.0)
RBC: 4.61 Mil/uL (ref 4.22–5.81)
RDW: 15.6 % — ABNORMAL HIGH (ref 11.5–15.5)
WBC: 7.9 K/uL (ref 4.0–10.5)

## 2015-05-09 LAB — LIPID PANEL
Cholesterol: 192 mg/dL (ref 0–200)
HDL: 44 mg/dL (ref >39.00)
LDL CALC: 119 mg/dL — AB (ref 0–99)
NonHDL: 148.05
Total CHOL/HDL Ratio: 4
Triglycerides: 146 mg/dL (ref 0.0–149.0)
VLDL: 29.2 mg/dL (ref 0.0–40.0)

## 2015-05-09 LAB — BASIC METABOLIC PANEL WITH GFR
BUN: 12 mg/dL (ref 6–23)
CO2: 31 meq/L (ref 19–32)
Calcium: 9.2 mg/dL (ref 8.4–10.5)
Chloride: 104 meq/L (ref 96–112)
Creatinine, Ser: 0.66 mg/dL (ref 0.40–1.50)
GFR: 155.29 mL/min
Glucose, Bld: 96 mg/dL (ref 70–99)
Potassium: 4.5 meq/L (ref 3.5–5.1)
Sodium: 141 meq/L (ref 135–145)

## 2015-05-09 LAB — HEMOGLOBIN A1C: HEMOGLOBIN A1C: 6 % (ref 4.6–6.5)

## 2015-05-09 NOTE — Patient Instructions (Signed)
Health Maintenance  Topic Date Due  . Hepatitis C Screening  Jun 18, 1950  . ZOSTAVAX  04/30/2009  . TETANUS/TDAP  11/14/2012  . PNA vac Low Risk Adult (1 of 2 - PCV13) 04/30/2014  . INFLUENZA VACCINE  10/30/2015  . COLONOSCOPY  05/10/2019    Continue with yearly flu vaccine We will give Prevnar 13 today and we need to give pneumovax in one year.

## 2015-05-09 NOTE — Progress Notes (Signed)
Subjective:    Patient ID: Chad [name]., male    DOB: November 11, 1950, 65 y.o.   MRN: [id number]  HPI  Patient seen for Medicare wellness visit and medical follow-up  Chronic problems include obesity, history of benign colon polyps, borderline type 2 diabetes, history of prostate cancer, obstructive sleep apnea on chronic CPAP, mild thrombocytopenia. Denies any recent bleeding problems or easy bruising. No polyuria or polydipsia. Does not monitor blood sugars regularly.  He is followed by pulmonary regarding his sleep apnea. He has prostate cancer which has been treated with radiation seed implant. He is followed regularly by urology for that. He quit smoking 1988.  He had previous Pneumovax several years ago but no history of Prevnar 13. No history of shingles vaccine. Flu vaccine already given. Colonoscopy up-to-date.  History of mild hyperlipidemia. Currently does not take statin. No history of CAD or peripheral vascular disease. Patient requesting repeat lipids.  Past Medical History  Diagnosis Date  . 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 Arthritis   . OSA on CPAP   . Chronic interstitial lung disease ([place])     PULMONARY INFILTRATE  --  PULMOLOGIST-  DR CLANCE  . Chronic steroid use     INTERSTITIAL LUNG DISEASE  . Dyspnea on exertion   . History of anal fissures   . History of hypothyroidism   . Complication of anesthesia     EMERGENCE COMBATIVENESS---  PLEASE REFER TO VATS 01-05-1012 PROCEDURE ,  DR Linna Caprice DOCUMENTED GRADE IV DIFFICULT VISUAL AIRWAY  . Sleep apnea     on cpap   Past Surgical History  Procedure Laterality Date  . Mediastinoscopy  07-12-2007    BILATERAL PLEURAL EFFUSIONS  . Nasal sinus surgery    . Prostate biopsy  06/11/12    Adenocarcinoma  . Video assisted thoracoscopy (vats)/ lymph node sampling Left 01-05-2012    LUNG AND LYMPH NODE BX'S (CHRONIC INTERSTITIAL PNEUMONIA)  . Cardiovascular stress test  01-10-2008    NORMAL  EXERCISE STRESS TEST AT GOOD WORKLOAD  . Transthoracic echocardiogram  12-31-2011    NORMAL LVF/   EF  55-60%  . Radioactive seed implant N/A 12/23/2012    Procedure: RADIOACTIVE SEED IMPLANT;  Surgeon: Franchot Gallo, MD;  Location: [facility];  Service: Urology;  Laterality: N/A;   82   seeds implanted   . Colonoscopy  01-15-2009    polyps and tics  . Lung biopsy    . Polypectomy      reports that he quit smoking about 29 years ago. His smoking use included Cigarettes and Cigars. He has a 2 pack-year smoking history. He has never used smokeless tobacco. He reports that he drinks about 1.2 oz of alcohol per week. He reports that he does not use illicit drugs. family history includes Cancer in his paternal uncle; Colon cancer in his father; Diabetes in his daughter; Heart attack in his father; Nephrolithiasis in his father; Prostate cancer in his father. Allergies  Allergen Reactions  . Ivp Dye [Iodinated Diagnostic Agents] Anaphylaxis    Coma for a day in '09  . Lactose Intolerance (Gi) Other (See Comments)   1.  Risk factors based on Past Medical , Social, and Family history reviewed and as indicated above with no changes 2.  Limitations in physical activities None.  No recent falls. 3.  Depression/mood No active depression or anxiety issues 4.  Hearing No defiits 5.  ADLs independent in all. 6.  Cognitive  function (orientation to time and place, language, writing, speech,memory) no short or long term memory issues.  Language and judgement intact. 7.  Home Safety no issues 8.  Height, weight, and visual acuity.all stable. 9.  Counseling discussed more consistent exercise. 10. Recommendation of preventive services. Prevnar 13 and pneumovax in one year. 11. Labs based on risk factors A1C and fasting CBG 12. Care Plan as above. 13. Other Providers Dr Johnette Abraham. 14. Written schedule of screening/prevention services given to patient.   Review of Systems    Constitutional: Negative for fever, activity change, appetite change and fatigue.  HENT: Negative for congestion, ear pain and trouble swallowing.   Eyes: Negative for pain and visual disturbance.  Respiratory: Negative for cough, shortness of breath and wheezing.   Cardiovascular: Negative for chest pain and palpitations.  Gastrointestinal: Negative for nausea, vomiting, abdominal pain, diarrhea, constipation, blood in stool, abdominal distention and rectal pain.  Genitourinary: Negative for dysuria, hematuria and testicular pain.  Musculoskeletal: Negative for joint swelling and arthralgias.  Skin: Negative for rash.  Neurological: Negative for dizziness, syncope and headaches.  Hematological: Negative for adenopathy.  Psychiatric/Behavioral: Negative for confusion and dysphoric mood.       Objective:   Physical Exam  Constitutional: He is oriented to person, place, and time. He appears well-developed and well-nourished. No distress.  HENT:  Head: Normocephalic and atraumatic.  Right Ear: External ear normal.  Left Ear: External ear normal.  Mouth/Throat: Oropharynx is clear and moist.  Eyes: Conjunctivae and EOM are normal. Pupils are equal, round, and reactive to light.  Neck: Normal range of motion. Neck supple. No thyromegaly present.  Cardiovascular: Normal rate, regular rhythm and normal heart sounds.   No murmur heard. Pulmonary/Chest: No respiratory distress. He has no wheezes. He has no rales.  Abdominal: Soft. Bowel sounds are normal. He exhibits no distension and no mass. There is no tenderness. There is no rebound and no guarding.  Genitourinary:  Deferred as he sees urology regularly  Musculoskeletal: He exhibits no edema.  Lymphadenopathy:    He has no cervical adenopathy.  Neurological: He is alert and oriented to person, place, and time. He displays normal reflexes. No cranial nerve deficit.  Skin: No rash noted.  Psychiatric: He has a normal mood and affect.           Assessment & Plan:  #1 Medicare wellness visit. Prevnar 13 given. Will need Pneumovax in 1 year. Continue yearly flu vaccine. Check on coverage for shingles vaccine. Colonoscopy up-to-date  #2 history of mild thrombocytopenia. Recheck CBC  #3 history of borderline type 2 diabetes. We discussed lifestyle management. He is encouraged to lose some weight. Establish more consistent exercise. Check fasting sugar along with A1c. He was given given information about supervised exercise program through local [employer] that also offers some nutritional counseling  #4 history of mild hyperlipidemia. Patient requesting repeat fasting lipid panel. Diet and exercise discussed

## 2015-05-10 ENCOUNTER — Encounter: Payer: Self-pay | Admitting: Family Medicine

## 2015-07-30 DIAGNOSIS — Z8546 Personal history of malignant neoplasm of prostate: Secondary | ICD-10-CM | POA: Diagnosis not present

## 2015-07-30 DIAGNOSIS — M1712 Unilateral primary osteoarthritis, left knee: Secondary | ICD-10-CM | POA: Diagnosis not present

## 2015-07-30 DIAGNOSIS — M779 Enthesopathy, unspecified: Secondary | ICD-10-CM | POA: Diagnosis not present

## 2015-07-30 DIAGNOSIS — M25562 Pain in left knee: Secondary | ICD-10-CM | POA: Diagnosis not present

## 2015-07-30 DIAGNOSIS — Z87891 Personal history of nicotine dependence: Secondary | ICD-10-CM | POA: Diagnosis not present

## 2015-07-30 DIAGNOSIS — Z6835 Body mass index (BMI) 35.0-35.9, adult: Secondary | ICD-10-CM | POA: Diagnosis not present

## 2015-10-25 ENCOUNTER — Telehealth: Payer: Self-pay | Admitting: Internal Medicine

## 2015-10-25 DIAGNOSIS — J849 Interstitial pulmonary disease, unspecified: Secondary | ICD-10-CM

## 2015-10-25 NOTE — Telephone Encounter (Signed)
Pt is a former patient of Dr Gwenette Greet and has a Consult appt with Dr Chase Caller on 11/21/15 Pt is requesting a CT scan to be repeated prior to being seen. Last CT was 01/2015 and it was a High Res CT.  Pt states that he has noticed his SOB getting a little worse. Pt aware that MR is out of town and will answer this once he returns.  Pleas advise MR. Thanks.

## 2015-10-26 NOTE — Telephone Encounter (Signed)
[place] do both   - full PFT and   - High Resolution CT chest without contrast on ILD protocol. DO both Supine and Prone Images. Only  Dr Lorin Picket or Dr Salvatore Marvel Dr. Vinnie Langton to read.   - I think he was at [facility] meeting yesterday  Thanks  Dr. Brand Males, M.D., [facility].C.P Pulmonary and Critical Care Medicine Staff Physician [place] [facility] Pager: [phone], If no answer or between  15:00h - 7:00h: call [phone]  10/26/2015 1:42 PM

## 2015-10-29 NOTE — Telephone Encounter (Signed)
Spoke with pt. He is aware of MR's recommendation. Orders have been placed. Nothing further was needed.

## 2015-11-06 ENCOUNTER — Ambulatory Visit (INDEPENDENT_AMBULATORY_CARE_PROVIDER_SITE_OTHER)
Admission: RE | Admit: 2015-11-06 | Discharge: 2015-11-06 | Disposition: A | Discharge status: Home / Self Care | Payer: Medicare Other | Source: Ambulatory Visit | Attending: Internal Medicine | Admitting: Internal Medicine

## 2015-11-06 ENCOUNTER — Ambulatory Visit (HOSPITAL_COMMUNITY)
Admission: RE | Admit: 2015-11-06 | Discharge: 2015-11-06 | Disposition: A | Discharge status: Home / Self Care | Payer: Medicare Other | Source: Ambulatory Visit | Attending: Internal Medicine | Admitting: Internal Medicine

## 2015-11-06 DIAGNOSIS — J849 Interstitial pulmonary disease, unspecified: Secondary | ICD-10-CM

## 2015-11-06 DIAGNOSIS — J479 Bronchiectasis, uncomplicated: Secondary | ICD-10-CM | POA: Diagnosis not present

## 2015-11-06 LAB — PULMONARY FUNCTION TEST
DL/VA % pred: 100 %
DL/VA: 4.75 ml/min/mmHg/L
DLCO unc % pred: 51 %
DLCO unc: 18.18 ml/min/mmHg
FEF 25-75 Post: 3.75 L/sec
FEF 25-75 Pre: 3.28 L/sec
FEF2575-%CHANGE-POST: 14 %
FEF2575-%PRED-POST: 132 %
FEF2575-%Pred-Pre: 115 %
FEV1-%Change-Post: 0 %
FEV1-%PRED-POST: 73 %
FEV1-%PRED-PRE: 73 %
FEV1-POST: 2.36 L
FEV1-PRE: 2.35 L
FEV1FVC-%Change-Post: 4 %
FEV1FVC-%PRED-PRE: 109 %
FEV6-%Change-Post: -4 %
FEV6-%PRED-POST: 66 %
FEV6-%Pred-Pre: 69 %
FEV6-POST: 2.68 L
FEV6-Pre: 2.8 L
FEV6FVC-%PRED-PRE: 104 %
FEV6FVC-%Pred-Post: 104 %
FVC-%Change-Post: -4 %
FVC-%PRED-POST: 63 %
FVC-%PRED-PRE: 66 %
FVC-POST: 2.68 L
FVC-PRE: 2.8 L
POST FEV1/FVC RATIO: 88 %
Post FEV6/FVC ratio: 100 %
Pre FEV1/FVC ratio: 84 %
Pre FEV6/FVC Ratio: 100 %
RV % PRED: 54 %
RV: 1.35 L
TLC % pred: 56 %
TLC: 4.22 L

## 2015-11-06 MED ORDER — ALBUTEROL SULFATE (2.5 MG/3ML) 0.083% IN NEBU
2.5000 mg | INHALATION_SOLUTION | Freq: Once | RESPIRATORY_TRACT | Status: AC
  Administered 2015-11-06: 2.5 mg via RESPIRATORY_TRACT

## 2015-11-08 ENCOUNTER — Telehealth: Payer: Self-pay | Admitting: Internal Medicine

## 2015-11-08 NOTE — Telephone Encounter (Signed)
Spoke with pt. He had some questions about his CT scan. I answered his questions to the best of my ability. Nothing further was needed.

## 2015-11-21 ENCOUNTER — Ambulatory Visit (INDEPENDENT_AMBULATORY_CARE_PROVIDER_SITE_OTHER): Payer: Medicare Other | Admitting: Internal Medicine

## 2015-11-21 ENCOUNTER — Encounter: Payer: Self-pay | Admitting: Internal Medicine

## 2015-11-21 VITALS — BP 124/80 | HR 77 | Ht 74.0 in | Wt 274.0 lb

## 2015-11-21 DIAGNOSIS — I2510 Atherosclerotic heart disease of native coronary artery without angina pectoris: Secondary | ICD-10-CM

## 2015-11-21 DIAGNOSIS — J849 Interstitial pulmonary disease, unspecified: Secondary | ICD-10-CM

## 2015-11-21 DIAGNOSIS — J479 Bronchiectasis, uncomplicated: Secondary | ICD-10-CM | POA: Diagnosis not present

## 2015-11-21 DIAGNOSIS — I700 Atherosclerosis of aorta: Secondary | ICD-10-CM | POA: Diagnosis not present

## 2015-11-21 MED ORDER — BUDESONIDE-FORMOTEROL FUMARATE 160-4.5 MCG/ACT IN AERO: 2.0000 | INHALATION_SPRAY | Freq: Two times a day (BID) | RESPIRATORY_TRACT | 0 refills | Status: DC

## 2015-11-21 NOTE — Patient Instructions (Signed)
ILD (interstitial lung disease) ([place]) Bronchiectasis without complication ([place]) - try sample symbicort for 1 months and see if this helps your walking oxygen levels while on stairs   Coronary artery calcification seen on CAT scan Aortic calcification [facility]) - refer cardiology   Followup 3 months or sooner  - next visit might consider walking you up the stairs

## 2015-11-21 NOTE — Progress Notes (Signed)
Subjective:     Patient ID: Chad [name], male   DOB: February 11, 1951, 65 y.o.   MRN: [id number]  HPI   OV 11/21/2015  Chief Complaint  Patient presents with  . BOOP    VS pt, having issues with increased DOE.   FU ILD - 2013 surgical lung biopsy - chronic interstitial pneumonia with boop features (ANA trace positive but otherwise autoimmune and HP panel negative). Used to be followed by Dr Gwenette Greet. Now transfering care to Dr Chase Caller. He sees Dr Halford Chessman for OSA. Says that after initial dx had Rx with prednisone for few months and then improved and was back to baseline. HE even came off o2. Correlating with his story he seemed to have slowly improving filtrates on CT ove time (sept 2013 -> feb 2014 -> nov 2016). However, now he says that last few months when he walks flight of stairs or incline in hill he desatiurates (new finding) to 88% or so. NEver used to happen before and he has been monitoring with pulse ox for many years.   CT chest 11/06/15 shows bilateral LL bronchiectasis widespread but no ILD. Suggestion is stable findings compared to ones prior. PFT 11/06/15 - fvc 2.80L/665 and similar to nov 2016 and TLC 4.22L/56% and similar to nov 2016 though DLCO (subject to effort) ? Slight decrese v stablitiy at 18/18/51%.   Walking desats in our office on level ground 185 feet x 3 laps: no desats     has a past medical history of Arthritis; Chronic interstitial lung disease ([place]); Chronic steroid use; Complication of anesthesia; Dyspnea on exertion; History of anal fissures; History of hypothyroidism; OSA on CPAP; Prostate cancer ([place]) (06/09/12 bx); and Sleep apnea.   reports that he quit smoking about 29 years ago. His smoking use included Cigarettes and Cigars. He has a 2.00 pack-year smoking history. He has never used smokeless tobacco.  Past Surgical History:  Procedure Laterality Date  . CARDIOVASCULAR STRESS TEST  01-10-2008   NORMAL EXERCISE STRESS TEST AT GOOD WORKLOAD  . COLONOSCOPY   01-15-2009   polyps and tics  . LUNG BIOPSY    . MEDIASTINOSCOPY  07-12-2007   BILATERAL PLEURAL EFFUSIONS  . NASAL SINUS SURGERY    . POLYPECTOMY    . prostate biopsy  06/11/12   Adenocarcinoma  . RADIOACTIVE SEED IMPLANT N/A 12/23/2012   Procedure: RADIOACTIVE SEED IMPLANT;  Surgeon: Franchot Gallo, MD;  Location: [facility];  Service: Urology;  Laterality: N/A;   82   seeds implanted   . TRANSTHORACIC ECHOCARDIOGRAM  12-31-2011   NORMAL LVF/   EF  55-60%  . VIDEO ASSISTED THORACOSCOPY (VATS)/ LYMPH NODE SAMPLING Left 01-05-2012   LUNG AND LYMPH NODE BX'S (CHRONIC INTERSTITIAL PNEUMONIA)    Allergies  Allergen Reactions  . Ivp Dye [Iodinated Diagnostic Agents] Anaphylaxis    Coma for a day in '09  . Lactose Intolerance (Gi) Other (See Comments)    Immunization History  Administered Date(s) Administered  . Influenza Split 01/02/2011, 03/08/2012  . Influenza Whole 01/29/2010  . Influenza,inj,Quad PF,36+ Mos 12/28/2012, 01/25/2014, 01/25/2015  . Pneumococcal Conjugate-13 05/09/2015  . Pneumococcal Polysaccharide-23 03/21/2008  . Td 11/15/2002    Family History  Problem Relation Age of Onset  . Heart attack Father   . Prostate cancer Father     seed implant  . Nephrolithiasis Father   . Colon cancer Father     passed 08-2013  . Diabetes Daughter   . Cancer Paternal Uncle  Current Outpatient Prescriptions:  .  Ibuprofen (ADVIL) 200 MG CAPS, Take by mouth as needed., Disp: , Rfl:  .  budesonide-formoterol (SYMBICORT) 160-4.5 MCG/ACT inhaler, Inhale 2 puffs into the lungs 2 (two) times daily., Disp: 1 Inhaler, Rfl: 0    Review of Systems     Objective:   Physical Exam  Constitutional: He is oriented to person, place, and time. He appears well-developed and well-nourished. No distress.  HENT:  Head: Normocephalic and atraumatic.  Right Ear: External ear normal.  Left Ear: External ear normal.  Mouth/Throat: Oropharynx is clear and moist.  No oropharyngeal exudate.  Eyes: Conjunctivae and EOM are normal. Pupils are equal, round, and reactive to light. Right eye exhibits no discharge. Left eye exhibits no discharge. No scleral icterus.  Neck: Normal range of motion. Neck supple. No JVD present. No tracheal deviation present. No thyromegaly present.  Cardiovascular: Normal rate, regular rhythm and intact distal pulses.  Exam reveals no gallop and no friction rub.   No murmur heard. Pulmonary/Chest: Effort normal and breath sounds normal. No respiratory distress. He has no wheezes. He has no rales. He exhibits no tenderness.  Abdominal: Soft. Bowel sounds are normal. He exhibits no distension and no mass. There is no tenderness. There is no rebound and no guarding.  Visceral obesity +  Musculoskeletal: Normal range of motion. He exhibits no edema or tenderness.  Lymphadenopathy:    He has no cervical adenopathy.  Neurological: He is alert and oriented to person, place, and time. He has normal reflexes. No cranial nerve deficit. Coordination normal.  Skin: Skin is warm and dry. No rash noted. He is not diaphoretic. No erythema. No pallor.  Psychiatric: He has a normal mood and affect. His behavior is normal. Judgment and thought content normal.  Nursing note and vitals reviewed.   Vitals:   11/21/15 1150  BP: 124/80  Pulse: 77  SpO2: 99%  Weight: 274 lb (124.3 kg)  Height: 6\' 2"  (1.88 m)       Assessment:       ICD-9-CM ICD-10-CM   1. ILD (interstitial lung disease) ([place]) 515 J84.9   2. Bronchiectasis without complication ([facility]) A999333 J47.9   3. Coronary artery calcification seen on CAT scan 414.00 I25.10 Ambulatory referral to Cardiology  4. Aortic calcification (H[facility]) 440.0 I70.0    He is complaining of exertional hypoxemia for inclines that is new. CT itself only shows residual bronchietasis presumably post Rx from 2013 that is stable. PFT- stable FVC, TLC but ? DLCO worse; unclear  Will try symbicort for  bronchiectasis and see if this improves exertional desats. Also, will get cards eval for co art calcification and aortic calcification  He and wife agreeable with plan     Plan:     ILD (interstitial lung disease) ([place]) Bronchiectasis without complication ([place]) - try sample symbicort for 1 months and see if this helps your walking oxygen levels while on stairs   Coronary artery calcification seen on CAT scan Aortic calcification ([place]) - refer cardiology   Followup 3 months or sooner  - next visit might consider walking you up the stair  > 50% of this > 25 min visit spent in face to face counseling or coordination of care    Dr. Brand Males, M.D., [facility].C.P Pulmonary and Critical Care Medicine Staff Physician [place] [facility] Pager: [phone], If no answer or between  15:00h - 7:00h: call [phone]  11/21/2015 2:25  PM

## 2015-11-21 NOTE — Progress Notes (Signed)
Patient ID: Chad [name], male   DOB: Oct 02, 1950, 65 y.o.   MRN: [id number] Patient seen in the office today and instructed on use of SYMBICORT AEROSOL.  Patient expressed understanding and demonstrated technique.

## 2015-11-27 ENCOUNTER — Encounter: Payer: Self-pay | Admitting: Cardiology

## 2015-11-27 NOTE — Progress Notes (Signed)
Cardiology Office Note   Date:  11/28/2015  ID:  Chad [name], DOB 1951/01/28, MRN [id number]  PCP:  Drema Pry, DO  Cardiologist:   Will Meredith Leeds, MD    Chief Complaint  Patient presents with  . New Patient (Initial Visit)    CAD     History of Present Illness: Chad [name] is a 65 y.o. male who presents today for cardiology evaluation.   Had a CT scan showing coronary calcifications, calcified aortic valve.    Today, he denies symptoms of palpitations, chest pain, shortness of breath, orthopnea, PND, lower extremity edema, claudication, dizziness, presyncope, syncope, bleeding, or neurologic sequela. The patient is tolerating medications without difficulties and is otherwise without complaint today. He says that he has not had any episodes of chest pain. He does get short of breath, with shortness of breath walking up a flight of stairs and when he exerts himself, he says that his oxygen saturations can get into the 70s. He recently saw his pulmonologist who said that this is likely due to his pulmonary fibrosis.   Past Medical History:  Diagnosis Date  . Arthritis   . Chronic interstitial lung disease ([place])    PULMONARY INFILTRATE  --  PULMOLOGIST-  DR CLANCE  . Chronic steroid use    INTERSTITIAL LUNG DISEASE  . Complication of anesthesia    EMERGENCE COMBATIVENESS---  PLEASE REFER TO VATS 01-05-1012 PROCEDURE ,  DR Linna Caprice DOCUMENTED GRADE IV DIFFICULT VISUAL AIRWAY  . Dyspnea on exertion   . History of anal fissures   . History of hypothyroidism   . OSA on CPAP   . Prostate cancer ([place]) 06/09/12 bx   Adenocarcinoma  . Sleep apnea    on cpap   Past Surgical History:  Procedure Laterality Date  . CARDIOVASCULAR STRESS TEST  01-10-2008   NORMAL EXERCISE STRESS TEST AT GOOD WORKLOAD  . COLONOSCOPY  01-15-2009   polyps and tics  . LUNG BIOPSY    . MEDIASTINOSCOPY  07-12-2007   BILATERAL PLEURAL EFFUSIONS  . NASAL SINUS SURGERY    . POLYPECTOMY    .  prostate biopsy  06/11/12   Adenocarcinoma  . RADIOACTIVE SEED IMPLANT N/A 12/23/2012   Procedure: RADIOACTIVE SEED IMPLANT;  Surgeon: Franchot Gallo, MD;  [facility];  Service: Urology;  Laterality: N/A;   82   seeds implanted   . TRANSTHORACIC ECHOCARDIOGRAM  12-31-2011   NORMAL LVF/   EF  55-60%  . VIDEO ASSISTED THORACOSCOPY (VATS)/ LYMPH NODE SAMPLING Left 01-05-2012   LUNG AND LYMPH NODE BX'S (CHRONIC INTERSTITIAL PNEUMONIA)     Current Outpatient Prescriptions  Medication Sig Dispense Refill  . budesonide-formoterol (SYMBICORT) 160-4.5 MCG/ACT inhaler Inhale 2 puffs into the lungs 2 (two) times daily. 1 Inhaler 0  . Ibuprofen (ADVIL) 200 MG CAPS Take by mouth as needed.     No current facility-administered medications for this visit.     Allergies:   Ivp dye [iodinated diagnostic agents] and Lactose intolerance (gi)   Social History:  The patient  reports that he quit smoking about 29 years ago. His smoking use included Cigarettes and Cigars. He has a 2.00 pack-year smoking history. He has never used smokeless tobacco. He reports that he drinks about 1.2 oz of alcohol per week . He reports that he does not use drugs.   Family History:  The patient's family history includes Alzheimer's disease in his mother; Cancer in his paternal uncle; Colon cancer in his father; Diabetes in  his daughter; Heart attack in his father; Nephrolithiasis in his father; Prostate cancer in his father.    ROS:  Please see the history of present illness.   Otherwise, review of systems is positive for DOE.   All other systems are reviewed and negative.    PHYSICAL EXAM: VS:  BP 122/82   Pulse 74   Ht 6\' 2"  (1.88 m)   Wt 270 lb 9.6 oz (122.7 kg)   BMI 34.74 kg/m  , BMI Body mass index is 34.74 kg/m. GEN: Well nourished, well developed, in no acute distress  HEENT: normal  Neck: no JVD, carotid bruits, or masses Cardiac: RRR; no murmurs, rubs, or gallops,no edema    Respiratory:  clear to auscultation bilaterally, normal work of breathing GI: soft, nontender, nondistended, + BS MS: no deformity or atrophy  Skin: warm and dry Neuro:  Strength and sensation are intact Psych: euthymic mood, full affect  EKG:  EKG is ordered today. Personal review of the ekg ordered shows sinus rhythm, rate 74, incomplete RBBB   Recent Labs: 05/09/2015: BUN 12; Creatinine, Ser 0.66; Hemoglobin 13.5; Platelets 156.0; Potassium 4.5; Sodium 141    Lipid Panel     Component Value Date/Time   CHOL 192 05/09/2015 1049   TRIG 146.0 05/09/2015 1049   HDL 44.00 05/09/2015 1049   CHOLHDL 4 05/09/2015 1049   VLDL 29.2 05/09/2015 1049   LDLCALC 119 (H) 05/09/2015 1049     Wt Readings from Last 3 Encounters:  11/28/15 270 lb 9.6 oz (122.7 kg)  11/21/15 274 lb (124.3 kg)  05/09/15 279 lb 6.4 oz (126.7 kg)      Other studies Reviewed: Additional studies/ records that were reviewed today include: TTE 2013  Review of the above records today demonstrates:  Left ventricle: The cavity size was normal. Wall thickness was normal. Systolic function was normal. The estimated ejection fraction was in the range of 55% to 60%.   ASSESSMENT AND PLAN:  1.  Coronary calcification: This time, it appears that he is not having symptoms from his coronary calcification. He has no chest pain. Due to the fact that consultation was found on his CT scan, we'll order a fasting lipid panel. It is likely that he will require statin therapy.  2. OSA: on CPAP    Current medicines are reviewed at length with the patient today.   The patient does not have concerns regarding his medicines.  The following changes were made today:  none  Labs/ tests ordered today include:  Orders Placed This Encounter  Procedures  . Lipid Profile  . EKG 12-Lead     Disposition:   FU with Will Camnitz 6 months  Signed, Will Meredith Leeds, MD  11/28/2015 12:05 PM     [address] [place] [address] [phone] (office) [phone] (fax)

## 2015-11-28 ENCOUNTER — Encounter: Payer: Self-pay | Admitting: Cardiology

## 2015-11-28 ENCOUNTER — Ambulatory Visit (INDEPENDENT_AMBULATORY_CARE_PROVIDER_SITE_OTHER): Payer: Medicare Other | Admitting: Cardiology

## 2015-11-28 VITALS — BP 122/82 | HR 74 | Ht 74.0 in | Wt 270.6 lb

## 2015-11-28 DIAGNOSIS — I2510 Atherosclerotic heart disease of native coronary artery without angina pectoris: Secondary | ICD-10-CM

## 2015-11-28 NOTE — Addendum Note (Signed)
Addended by: Stanton Kidney on: 11/28/2015 12:37 PM   Modules accepted: Orders

## 2015-11-28 NOTE — Patient Instructions (Addendum)
Medication Instructions:  Your physician recommends that you continue on your current medications as directed. Please refer to the Current Medication list given to you today.  Labwork: Lipid profile next week  Testing/Procedures: None  ordered  Follow-Up: Your physician wants you to follow-up in: 6 months with Dr. Curt Bears. You will receive a reminder letter in the mail two months in advance. If you don't receive a letter, please call our office to schedule the follow-up appointment.  If you need a refill on your cardiac medications before your next appointment, please call your pharmacy.  Thank you for choosing [facility]!!   Trinidad Curet, RN [phone]

## 2015-11-29 NOTE — Addendum Note (Signed)
Addended by: Stanton Kidney on: 11/29/2015 09:40 AM   Modules accepted: Orders

## 2015-12-01 DIAGNOSIS — M545 Low back pain: Secondary | ICD-10-CM | POA: Diagnosis not present

## 2015-12-01 DIAGNOSIS — M542 Cervicalgia: Secondary | ICD-10-CM | POA: Diagnosis not present

## 2015-12-04 ENCOUNTER — Other Ambulatory Visit: Payer: Medicare Other | Admitting: *Deleted

## 2015-12-04 DIAGNOSIS — I2510 Atherosclerotic heart disease of native coronary artery without angina pectoris: Secondary | ICD-10-CM

## 2015-12-04 LAB — LIPID PANEL
Cholesterol: 179 mg/dL (ref 125–200)
HDL: 57 mg/dL (ref >=40)
LDL CALC: 104 mg/dL (ref <130)
TRIGLYCERIDES: 89 mg/dL (ref <150)
Total CHOL/HDL Ratio: 3.1 Ratio (ref <=5.0)
VLDL: 18 mg/dL (ref <30)

## 2015-12-06 ENCOUNTER — Encounter: Payer: Self-pay | Admitting: Family Medicine

## 2015-12-06 ENCOUNTER — Ambulatory Visit (INDEPENDENT_AMBULATORY_CARE_PROVIDER_SITE_OTHER): Payer: Medicare Other | Admitting: Family Medicine

## 2015-12-06 VITALS — BP 120/74 | HR 86 | Temp 98.3°F | Ht 74.0 in | Wt 268.0 lb

## 2015-12-06 DIAGNOSIS — M542 Cervicalgia: Secondary | ICD-10-CM

## 2015-12-06 DIAGNOSIS — M545 Low back pain, unspecified: Secondary | ICD-10-CM

## 2015-12-06 DIAGNOSIS — I2510 Atherosclerotic heart disease of native coronary artery without angina pectoris: Secondary | ICD-10-CM

## 2015-12-06 DIAGNOSIS — IMO0001 Reserved for inherently not codable concepts without codable children: Secondary | ICD-10-CM

## 2015-12-06 DIAGNOSIS — R030 Elevated blood-pressure reading, without diagnosis of hypertension: Secondary | ICD-10-CM | POA: Diagnosis not present

## 2015-12-06 DIAGNOSIS — G5602 Carpal tunnel syndrome, left upper limb: Secondary | ICD-10-CM | POA: Diagnosis not present

## 2015-12-06 DIAGNOSIS — M159 Polyosteoarthritis, unspecified: Secondary | ICD-10-CM

## 2015-12-06 NOTE — Progress Notes (Signed)
Pre visit review using our clinic review tool, if applicable. No additional management support is needed unless otherwise documented below in the visit note. 

## 2015-12-06 NOTE — Patient Instructions (Signed)
BEFORE YOU LEAVE: -follow up: in 4-6 weeks for new patient visit with Tommi Rumps, Dr. [place], Julie, or Dr. Maudie Mercury per patient preference -permission/obtain imaging results from recent emergency room visit  Do the home physical therapy exercises 4 days per week.  Use the muscle relaxer at night as needed.  Use tylenol or naproxen per instructions only as needed for pain. Topical sports creams over the counter with menthol and heat are also helpful. You can make a good heating pad by filling a tube sock with basamati rice and heating it in the microwave as needed.  Seek care sooner if worsening or new concerns.
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
## 2015-12-07 ENCOUNTER — Other Ambulatory Visit: Payer: Self-pay | Admitting: *Deleted

## 2015-12-07 DIAGNOSIS — I2510 Atherosclerotic heart disease of native coronary artery without angina pectoris: Secondary | ICD-10-CM

## 2015-12-27 ENCOUNTER — Ambulatory Visit (INDEPENDENT_AMBULATORY_CARE_PROVIDER_SITE_OTHER): Payer: Medicare Other | Admitting: Adult Health

## 2015-12-27 ENCOUNTER — Encounter: Payer: Self-pay | Admitting: Adult Health

## 2015-12-27 VITALS — BP 128/84 | Temp 98.2°F | Ht 74.0 in | Wt 269.4 lb

## 2015-12-27 DIAGNOSIS — Z23 Encounter for immunization: Secondary | ICD-10-CM

## 2015-12-27 DIAGNOSIS — R7309 Other abnormal glucose: Secondary | ICD-10-CM

## 2015-12-27 DIAGNOSIS — Z7189 Other specified counseling: Secondary | ICD-10-CM

## 2015-12-27 DIAGNOSIS — I2510 Atherosclerotic heart disease of native coronary artery without angina pectoris: Secondary | ICD-10-CM

## 2015-12-27 DIAGNOSIS — Z760 Encounter for issue of repeat prescription: Secondary | ICD-10-CM | POA: Diagnosis not present

## 2015-12-27 DIAGNOSIS — Z7689 Persons encountering health services in other specified circumstances: Secondary | ICD-10-CM

## 2015-12-27 LAB — POCT GLYCOSYLATED HEMOGLOBIN (HGB A1C): Hemoglobin A1C: 5.8

## 2015-12-27 MED ORDER — NAPROXEN 500 MG PO TABS: 500.0000 mg | ORAL_TABLET | Freq: Two times a day (BID) | ORAL | 6 refills | Status: DC
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
## 2015-12-27 NOTE — Patient Instructions (Addendum)
It was great meeting you today!  Your A1c is 5.8   Exercise as tolerated and a heart healthy diet to keep your blood sugars low.   Follow up in February for your next annual exam. If you need anything before that please let me know

## 2016-01-09 DIAGNOSIS — C61 Malignant neoplasm of prostate: Secondary | ICD-10-CM | POA: Diagnosis not present

## 2016-01-09 DIAGNOSIS — R300 Dysuria: Secondary | ICD-10-CM | POA: Diagnosis not present

## 2016-01-09 DIAGNOSIS — R590 Localized enlarged lymph nodes: Secondary | ICD-10-CM | POA: Diagnosis not present

## 2016-01-09 DIAGNOSIS — Z87891 Personal history of nicotine dependence: Secondary | ICD-10-CM | POA: Diagnosis not present

## 2016-01-09 DIAGNOSIS — Z8546 Personal history of malignant neoplasm of prostate: Secondary | ICD-10-CM | POA: Diagnosis not present

## 2016-01-09 DIAGNOSIS — N3289 Other specified disorders of bladder: Secondary | ICD-10-CM | POA: Diagnosis not present
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
## 2016-01-30 NOTE — Progress Notes (Signed)
[facility] PREP Weekly Session   Patient Details  Name: Chad [name] MRN: [id number] Date of Birth: 1950-08-16 Age: 65 y.o. PCP: Dorothyann Peng, NP  Vitals:   01/30/16 1245  Weight: 270 lb (122.5 kg)        [facility] Weekly seesion - 01/30/16 1200      Weekly Session   Topic Discussed Healthy eating tips   Minutes exercised this week 320 minutes  all cardio\   Classes attended to date --  has passed 12 weeks but still comes periodically to stay ac   Comments "grateful for family and health and dropped 2 pant sizes"       [name] 01/30/2016, 12:46 PM

## 2016-02-06 DIAGNOSIS — H04123 Dry eye syndrome of bilateral lacrimal glands: Secondary | ICD-10-CM | POA: Diagnosis not present

## 2016-02-06 DIAGNOSIS — H11153 Pinguecula, bilateral: Secondary | ICD-10-CM | POA: Diagnosis not present

## 2016-02-27 ENCOUNTER — Encounter: Payer: Self-pay | Admitting: Internal Medicine

## 2016-02-27 ENCOUNTER — Ambulatory Visit (INDEPENDENT_AMBULATORY_CARE_PROVIDER_SITE_OTHER): Payer: Medicare Other | Admitting: Internal Medicine

## 2016-02-27 VITALS — BP 110/72 | HR 79 | Ht 74.0 in | Wt 271.0 lb

## 2016-02-27 DIAGNOSIS — I2510 Atherosclerotic heart disease of native coronary artery without angina pectoris: Secondary | ICD-10-CM

## 2016-02-27 DIAGNOSIS — J479 Bronchiectasis, uncomplicated: Secondary | ICD-10-CM | POA: Diagnosis not present

## 2016-02-27 DIAGNOSIS — G4733 Obstructive sleep apnea (adult) (pediatric): Secondary | ICD-10-CM

## 2016-02-27 DIAGNOSIS — R0600 Dyspnea, unspecified: Secondary | ICD-10-CM

## 2016-02-27 DIAGNOSIS — Z9989 Dependence on other enabling machines and devices: Secondary | ICD-10-CM
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
## 2016-02-27 NOTE — Patient Instructions (Addendum)
ICD-9-CM ICD-10-CM   1. Dyspnea, unspecified type 786.09 R06.00   2. Bronchiectasis without complication (HCC) A999333 J47.9   3. Coronary artery calcification seen on CAT scan 414.00 I25.10   4. OSA on CPAP 327.23 G47.33    V46.8 Z99.89    Sort out why exactly you're short of breath just a pulmonary stress test on an exercise bike next few to several weeks  Return to see me after the pulmonary stress test in the neex few to several weeks

## 2016-03-10 ENCOUNTER — Other Ambulatory Visit: Payer: Medicare Other | Admitting: *Deleted

## 2016-03-10 DIAGNOSIS — I2510 Atherosclerotic heart disease of native coronary artery without angina pectoris: Secondary | ICD-10-CM

## 2016-03-10 LAB — LIPID PANEL
CHOLESTEROL: 174 mg/dL (ref <200)
HDL: 45 mg/dL (ref >40)
LDL Cholesterol: 113 mg/dL — ABNORMAL HIGH (ref <100)
TRIGLYCERIDES: 78 mg/dL (ref <150)
Total CHOL/HDL Ratio: 3.9 Ratio (ref <5.0)
VLDL: 16 mg/dL (ref <30)

## 2016-03-12 ENCOUNTER — Ambulatory Visit (HOSPITAL_COMMUNITY): Payer: Medicare Other | Attending: Internal Medicine

## 2016-03-12 DIAGNOSIS — R0600 Dyspnea, unspecified: Secondary | ICD-10-CM | POA: Insufficient documentation

## 2016-03-12 DIAGNOSIS — Z9989 Dependence on other enabling machines and devices: Secondary | ICD-10-CM

## 2016-03-12 DIAGNOSIS — I2510 Atherosclerotic heart disease of native coronary artery without angina pectoris: Secondary | ICD-10-CM | POA: Insufficient documentation

## 2016-03-12 DIAGNOSIS — J479 Bronchiectasis, uncomplicated: Secondary | ICD-10-CM | POA: Diagnosis not present

## 2016-03-12 DIAGNOSIS — G4733 Obstructive sleep apnea (adult) (pediatric): Secondary | ICD-10-CM | POA: Insufficient documentation

## 2016-03-17 DIAGNOSIS — R0600 Dyspnea, unspecified: Secondary | ICD-10-CM | POA: Diagnosis not present

## 2016-04-23 NOTE — Progress Notes (Signed)
[facility] PREP Weekly Session   Patient Details  Name: Chad [name] MRN: [id number] Date of Birth: 1950/07/31 Age: 66 y.o. PCP: Dorothyann Peng, NP  Vitals:   04/23/16 1419  Weight: 264 lb (119.7 kg)        [facility] Weekly seesion - 04/23/16 1400      Weekly Session   Topic Discussed Finding support   Minutes exercised this week 80 minutes   Comments "grateful for family"       [name] 04/23/2016, 2:20 PM

## 2016-04-24 DIAGNOSIS — M25511 Pain in right shoulder: Secondary | ICD-10-CM | POA: Diagnosis not present

## 2016-04-24 DIAGNOSIS — M7521 Bicipital tendinitis, right shoulder: Secondary | ICD-10-CM | POA: Diagnosis not present

## 2016-04-24 DIAGNOSIS — G8929 Other chronic pain: Secondary | ICD-10-CM | POA: Diagnosis not present

## 2016-04-24 DIAGNOSIS — M85811 Other specified disorders of bone density and structure, right shoulder: Secondary | ICD-10-CM | POA: Diagnosis not present

## 2016-04-24 DIAGNOSIS — M7581 Other shoulder lesions, right shoulder: Secondary | ICD-10-CM | POA: Diagnosis not present

## 2016-04-28 ENCOUNTER — Telehealth: Payer: Self-pay | Admitting: Internal Medicine

## 2016-04-28 ENCOUNTER — Encounter: Payer: Self-pay | Admitting: Internal Medicine

## 2016-04-28 ENCOUNTER — Ambulatory Visit (INDEPENDENT_AMBULATORY_CARE_PROVIDER_SITE_OTHER): Payer: Medicare Other | Admitting: Internal Medicine

## 2016-04-28 VITALS — BP 120/64 | HR 82 | Ht 74.0 in | Wt 269.0 lb

## 2016-04-28 DIAGNOSIS — J479 Bronchiectasis, uncomplicated: Secondary | ICD-10-CM

## 2016-04-28 DIAGNOSIS — R0600 Dyspnea, unspecified: Secondary | ICD-10-CM
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
## 2016-04-28 NOTE — Telephone Encounter (Signed)
Chad [name] ask him if the symbiort helped his bronchiectasis or not? I forgot to  Thanks   Dr. Brand Males, M.D., [facility].C.P Pulmonary and Critical Care Medicine Staff Physician [place] [facility] Pager: [phone], If no answer or between  15:00h - 7:00h: call [phone]  04/28/2016 12:18 PM

## 2016-04-28 NOTE — Patient Instructions (Signed)
ICD-9-CM ICD-10-CM   1. Dyspnea, unspecified type 786.09 R06.00   2. Bronchiectasis without complication (HCC) A999333 J47.9     Shortness of breath probably more  Related to weight and physical conditioning The bronchiectasis is mild and at this point not contributing to most of shortness of breath  Plan  - weight loss  - albuterol as needed  - work out In gym  Followup  1 year or sooner if needed

## 2016-04-29 NOTE — Telephone Encounter (Signed)
Called and spoke to pt. Pt states he stopped taking Symbicort because of throat irritation.   Will forward to MR as FYI.

## 2016-04-30 NOTE — Telephone Encounter (Signed)
Thanks duly noted  Dr. Brand Males, M.D., [facility].C.P Pulmonary and Critical Care Medicine Staff Physician [place] [facility] Pager: [phone], If no answer or between  15:00h - 7:00h: call [phone]  04/30/2016 1:49 PM

## 2016-05-01 ENCOUNTER — Encounter: Payer: Self-pay | Admitting: Adult Health

## 2016-05-01 ENCOUNTER — Ambulatory Visit (INDEPENDENT_AMBULATORY_CARE_PROVIDER_SITE_OTHER): Payer: Medicare Other | Admitting: Adult Health

## 2016-05-01 VITALS — BP 122/64 | Temp 98.1°F | Ht 74.0 in | Wt 269.6 lb

## 2016-05-01 DIAGNOSIS — Z23 Encounter for immunization: Secondary | ICD-10-CM

## 2016-05-01 DIAGNOSIS — E039 Hypothyroidism, unspecified: Secondary | ICD-10-CM | POA: Diagnosis not present

## 2016-05-01 DIAGNOSIS — C61 Malignant neoplasm of prostate: Secondary | ICD-10-CM

## 2016-05-01 DIAGNOSIS — E785 Hyperlipidemia, unspecified: Secondary | ICD-10-CM

## 2016-05-01 DIAGNOSIS — R7309 Other abnormal glucose: Secondary | ICD-10-CM

## 2016-05-01 HISTORY — DX: Hyperlipidemia, unspecified: E78.5

## 2016-05-01 LAB — CBC WITH DIFFERENTIAL/PLATELET
Basophils Absolute: 0.1 10*3/uL (ref 0.0–0.1)
Basophils Relative: 0.9 % (ref 0.0–3.0)
Eosinophils Absolute: 0.3 10*3/uL (ref 0.0–0.7)
Eosinophils Relative: 4.5 % (ref 0.0–5.0)
HCT: 38.8 % — ABNORMAL LOW (ref 39.0–52.0)
Hemoglobin: 12.8 g/dL — ABNORMAL LOW (ref 13.0–17.0)
LYMPHS ABS: 2.1 10*3/uL (ref 0.7–4.0)
Lymphocytes Relative: 32.9 % (ref 12.0–46.0)
MCHC: 33.1 g/dL (ref 30.0–36.0)
MCV: 89.7 fl (ref 78.0–100.0)
MONO ABS: 0.5 10*3/uL (ref 0.1–1.0)
MONOS PCT: 8.3 % (ref 3.0–12.0)
Neutro Abs: 3.5 10*3/uL (ref 1.4–7.7)
Neutrophils Relative %: 53.4 % (ref 43.0–77.0)
Platelets: 152 10*3/uL (ref 150.0–400.0)
RBC: 4.32 Mil/uL (ref 4.22–5.81)
RDW: 15.3 % (ref 11.5–15.5)
WBC: 6.5 10*3/uL (ref 4.0–10.5)

## 2016-05-01 LAB — PSA: PSA: 0.1 ng/mL (ref 0.10–4.00)

## 2016-05-01 LAB — HEMOGLOBIN A1C: Hgb A1c MFr Bld: 5.7 % (ref 4.6–6.5)

## 2016-05-01 LAB — T4, FREE: FREE T4: 0.87 ng/dL (ref 0.60–1.60)

## 2016-05-01 LAB — T3, FREE: T3 FREE: 2.8 pg/mL (ref 2.3–4.2)

## 2016-05-01 LAB — TSH: TSH: 2.13 u[IU]/mL (ref 0.35–4.50)
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
## 2016-05-01 NOTE — Addendum Note (Signed)
Addended by: Sandria Bales B on: 05/01/2016 08:36 AM   Modules accepted: Orders

## 2016-05-02 LAB — LIPID PANEL
CHOLESTEROL: 187 mg/dL (ref 0–200)
HDL: 50.5 mg/dL (ref >39.00)
LDL CALC: 126 mg/dL — AB (ref 0–99)
NonHDL: 136.17
TRIGLYCERIDES: 49 mg/dL (ref 0.0–149.0)
Total CHOL/HDL Ratio: 4
VLDL: 9.8 mg/dL (ref 0.0–40.0)

## 2016-05-02 LAB — HEPATIC FUNCTION PANEL
ALT: 15 U/L (ref 0–53)
AST: 19 U/L (ref 0–37)
Albumin: 3.8 g/dL (ref 3.5–5.2)
Alkaline Phosphatase: 52 U/L (ref 39–117)
Bilirubin, Direct: 0.1 mg/dL (ref 0.0–0.3)
TOTAL PROTEIN: 7.3 g/dL (ref 6.0–8.3)
Total Bilirubin: 0.2 mg/dL (ref 0.2–1.2)

## 2016-05-02 LAB — BASIC METABOLIC PANEL
BUN: 13 mg/dL (ref 6–23)
CO2: 27 mEq/L (ref 19–32)
Calcium: 9 mg/dL (ref 8.4–10.5)
Chloride: 106 mEq/L (ref 96–112)
Creatinine, Ser: 0.76 mg/dL (ref 0.40–1.50)
GFR: 131.56 mL/min (ref >60.00)
GLUCOSE: 91 mg/dL (ref 70–99)
POTASSIUM: 4.3 meq/L (ref 3.5–5.1)
Sodium: 140 mEq/L (ref 135–145)

## 2016-06-06 NOTE — Progress Notes (Signed)
[facility] PREP Weekly Session   Patient Details  Name: Chad [name] MRN: [id number] Date of Birth: 1951-01-16 Age: 66 y.o. PCP: Dorothyann Peng, NP  Vitals:   06/06/16 1120  Weight: 263 lb (119.3 kg)        [facility] Weekly seesion - 06/06/16 1100      Weekly Session   Topic Discussed Restaurant Eating   Minutes exercised this week 420 minutes  360 cardio/60 strength       [name] 06/06/2016, 11:21 AM

## 2016-10-20 ENCOUNTER — Other Ambulatory Visit: Payer: Self-pay

## 2016-12-19 ENCOUNTER — Encounter: Payer: Self-pay | Admitting: Adult Health

## 2016-12-24 ENCOUNTER — Ambulatory Visit: Payer: Medicare Other

## 2016-12-30 ENCOUNTER — Ambulatory Visit (INDEPENDENT_AMBULATORY_CARE_PROVIDER_SITE_OTHER): Payer: Medicare Other

## 2016-12-30 VITALS — BP 120/80 | HR 80 | Ht 74.0 in | Wt 269.0 lb

## 2016-12-30 DIAGNOSIS — Z1159 Encounter for screening for other viral diseases: Secondary | ICD-10-CM

## 2016-12-30 DIAGNOSIS — Z23 Encounter for immunization: Secondary | ICD-10-CM | POA: Diagnosis not present

## 2016-12-30 DIAGNOSIS — Z0000 Encounter for general adult medical examination without abnormal findings: Secondary | ICD-10-CM | POA: Diagnosis not present

## 2016-12-30 NOTE — Progress Notes (Signed)
I have reviewed and agree with his plan

## 2016-12-30 NOTE — Progress Notes (Signed)
Subjective:   Chad [name] is a 66 y.o. male who presents for Medicare Annual/Subsequent preventive examination.  The Patient was informed that the wellness visit is to identify future health risk and educate and initiate measures that can reduce risk for increased disease through the lifespan.    Annual Wellness Assessment  Reports health as good   Preventive Screening -Counseling & Management  Medicare Annual Preventive Care Visit - Subsequent Last OV 05/2016 Hx prostate cancer - ok  Chronic interstitial lung disease/ oxygen level may drop when going up a hill   Colonoscopy 05/2014  Health Maintenance Due  Topic Date Due  . Hepatitis C Screening  01/05/1951  . TETANUS/TDAP  11/14/2012  . INFLUENZA VACCINE  10/29/2016   Medicare now request all "baby boomers" test for possible exposure to Hepatitis C. Many may have been exposed due to dental work, tatoo's, vaccinations when young. The Hepatitis C virus is dormant for many years and then sometimes will cause liver cancer. If you gave blood in the past 15 years, you were most likely checked for Hep C. If you rec'd blood; you may want to consider testing or if you are high risk for any other reason.     ETOH occ Former smoker with 2 pack years; quit 66 yo    Acupuncturist as poor, fair, good or great? Good   VS reviewed;   Diet - did weight 290 Lost 30lbs Breakfast; eggs; grits bacon or canidian bacon Lunch k and w; vegetables  Supper; wife cooks vegetables    BMI- 34  Feels good  Exercise- just getting back into exercise  Walks on treadmill; 20  Stationary bike - 15 minutes  Recommend 40 x days a week    Dental- no; just had some  Stressors: goes to the [facility]; takes cpap  Recommend consistent sleep time     Cardiac Risk Factors Addressed Hyperlipidemia - ratio 4; chol 187; HDL 50; trig 49 and LDL 126 Diabetes - neg; A1c 5.7   Advanced Directives Advanced Directive; Reviewed  advanced directive and agreed to receipt of information and discussion.  Focused face to face x  20 minutes discussing HCPOA and Living will and reviewed all the questions in the [place] forms. The patient voices understanding of HCPOA; LW reviewed and information provided on each question. Educated on how to revoke this HCPOA or LW at any time.   Also  discussed life prolonging measures (given a few examples) and where he could choose to initiate or not;  the ability to given the HCPOA power to change his living will or not if he cannot speak for himself; as well as finalizing the will by 2 unrelated witnesses and notary.  Will call for questions and given information on [facility] pastoral department for further assistance.       Patient Care Team: Dorothyann Peng, NP as PCP - General (Family Medicine) Brand Males, MD as Consulting Physician (Pulmonary Disease) Heggerick, Joesph Fillers, PA-C as Referring Physician (Physician Assistant) Bethann Goo, MD as Physician Assistant (Urology) Constance Haw, MD as Consulting Physician (Cardiology)       Objective:    Vitals: BP 120/80   Pulse 80   Ht 6\' 2"  (1.88 m)   Wt 269 lb (122 kg)   SpO2 98%   BMI 34.54 kg/m   Body mass index is 34.54 kg/m.  Tobacco History  Smoking Status  . Former Smoker  . Packs/day: 1.00  .  Years: 2.00  . Types: Cigarettes, Cigars  . Quit date: 04/17/1986  Smokeless Tobacco  . Never Used     Counseling given: Yes   Past Medical History:  Diagnosis Date  . Arthritis   . Chronic interstitial lung disease ([place])    PULMONARY INFILTRATE  --  PULMOLOGIST-  DR CLANCE  . Chronic steroid use    INTERSTITIAL LUNG DISEASE  . Complication of anesthesia    EMERGENCE COMBATIVENESS---  PLEASE REFER TO VATS 01-05-1012 PROCEDURE ,  DR Linna Caprice DOCUMENTED GRADE IV DIFFICULT VISUAL AIRWAY  . Dyspnea on exertion   . History of anal fissures   . History of hypothyroidism   . Prostate cancer ([place])  06/09/12 bx   Adenocarcinoma  . Sleep apnea    on cpap  . Thrombocytopenia ([place])    Past Surgical History:  Procedure Laterality Date  . CARDIOVASCULAR STRESS TEST  01-10-2008   NORMAL EXERCISE STRESS TEST AT GOOD WORKLOAD  . COLONOSCOPY  01-15-2009   polyps and tics  . LUNG BIOPSY    . MEDIASTINOSCOPY  07-12-2007   BILATERAL PLEURAL EFFUSIONS  . NASAL SINUS SURGERY    . POLYPECTOMY    . prostate biopsy  06/11/12   Adenocarcinoma  . RADIOACTIVE SEED IMPLANT N/A 12/23/2012   Procedure: RADIOACTIVE SEED IMPLANT;  Surgeon: Franchot Gallo, MD;  [facility];  Service: Urology;  Laterality: N/A;   81   seeds implanted   . TRANSTHORACIC ECHOCARDIOGRAM  12-31-2011   NORMAL LVF/   EF  55-60%  . VIDEO ASSISTED THORACOSCOPY (VATS)/ LYMPH NODE SAMPLING Left 01-05-2012   LUNG AND LYMPH NODE BX'S (CHRONIC INTERSTITIAL PNEUMONIA)   Family History  Problem Relation Age of Onset  . Heart attack Father   . Prostate cancer Father        seed implant  . Nephrolithiasis Father   . Colon cancer Father        passed 08-2013  . Alzheimer's disease Mother   . Diabetes Daughter   . Cancer Paternal Uncle    History  Sexual Activity  . Sexual activity: Not on file    Outpatient Encounter Prescriptions as of 12/30/2016  Medication Sig  . Ibuprofen (ADVIL) 200 MG CAPS Take by mouth as needed.  Marland Kitchen omega-3 acid ethyl esters (LOVAZA) 1 g capsule Take 1 g by mouth 2 (two) times daily.  . Red Yeast Rice Extract (RED YEAST RICE PO) Take 2 tablets by mouth daily.   No facility-administered encounter medications on file as of 12/30/2016.     Activities of Daily Living In your present state of health, do you have any difficulty performing the following activities: 12/30/2016  Hearing? N  Vision? N  Difficulty concentrating or making decisions? N  Walking or climbing stairs? N  Dressing or bathing? N  Doing errands, shopping? N  Preparing Food and eating ? N  Using the  Toilet? N  In the past six months, have you accidently leaked urine? Y  Comment up at hs   Do you have problems with loss of bowel control? N  Managing your Medications? N  Managing your Finances? N  Housekeeping or managing your Housekeeping? N  Some recent data might be hidden    Patient Care Team: Dorothyann Peng, NP as PCP - General (Family Medicine) Brand Males, MD as Consulting Physician (Pulmonary Disease) Heggerick, Joesph Fillers, PA-C as Referring Physician (Physician Assistant) Bethann Goo, MD as Physician Assistant (Urology) Constance Haw, MD as Consulting Physician (Cardiology)  Assessment:     Exercise Activities and Dietary recommendations Current Exercise Habits: Home exercise routine, Time (Minutes): 40, Frequency (Times/Week): 4, Weekly Exercise (Minutes/Week): 160, Intensity: Moderate  Goals    . Weight (lb) < 200 lb (90.7 kg)          Losing 10lbs will help the waist line Try to eat lean and green /  Check out  online nutrition programs as GumSearch.nl and http://vang.com/; fit5me; Look for foods with "whole" wheat; bran; oatmeal etc Shot at the farmer's markets in season for fresher choices  Watch for "hydrogenated" on the label of oils which are trans-fats.  Watch for "high fructose corn syrup" in snacks, yogurt or ketchup  Meats have less marbling; bright colored fruits and vegetables;  Canned; dump out liquid and wash vegetables. Be mindful of what we are eating  Portion control is essential to a health weight! Sit down; take a break and enjoy your meal; take smaller bites; put the fork down between bites;  It takes 20 minutes to get full; so check in with your fullness cues and stop eating when you start to fill full             Fall Risk Fall Risk  12/30/2016 05/09/2015 02/09/2013  Falls in the past year? Yes No No  Comment coming down the steps  - -  Number falls in past yr: 1 - -  Follow up Education provided - -    Depression Screen PHQ 2/9 Scores 12/30/2016 05/09/2015 02/09/2013  PHQ - 2 Score 0 0 2  PHQ- 9 Score - - 5    Cognitive Function Ad8 score reviewed for issues:  Issues making decisions:  Less interest in hobbies / activities:  Repeats questions, stories (family complaining):  Trouble using ordinary gadgets (microwave, computer, phone):  Forgets the month or year:   Mismanaging finances:   Remembering appts:  Daily problems with thinking and/or memory: Ad8 score is=0 Mother had [place] Exam 12/30/2016  Not completed: (No Data)       Ad8 score is 0   Immunization History  Administered Date(s) Administered  . Influenza Split 01/02/2011, 03/08/2012  . Influenza Whole 01/29/2010  . Influenza, High Dose Seasonal PF 12/27/2015, 12/30/2016  . Influenza,inj,Quad PF,6+ Mos 12/28/2012, 01/25/2014, 01/25/2015  . Pneumococcal Conjugate-13 05/09/2015  . Pneumococcal Polysaccharide-23 03/21/2008, 05/01/2016  . Td 11/15/2002   Screening Tests Health Maintenance  Topic Date Due  . Hepatitis C Screening  1950/06/06  . TETANUS/TDAP  11/14/2012  . INFLUENZA VACCINE  10/29/2016  . COLONOSCOPY  05/10/2019  . PNA vac Low Risk Adult  Completed      Plan:      PCP Notes   Health Maintenance Flu vaccine given  educated regarding shingrix. Educated regarding Tdap; may take at the pharmacy Educated regarding hepatitis C  Colonoscopy 05/2016; due 05/2019  Thinking about going back to [facility]. Will monitor his diet over the next week. Educated on decreasing triglycerides and saturated fat. Sees cardiology in one week.  Abnormal Screens  Wife states he has a hearing issues;  Educated on hearing checks if he desires  Referrals  none  Patient concerns; Losing weight and getting back to exercise Concerned about his HdL   Nurse Concerns; As noted  Next PCP apt TBS    I have personally reviewed and noted the following  in the patient's chart:   . Medical and social history .  Use of alcohol, tobacco or illicit drugs  . Current medications and supplements . Functional ability and status . Nutritional status . Physical activity . Advanced directives . List of other physicians . Hospitalizations, surgeries, and ER visits in previous 12 months . Vitals . Screenings to include cognitive, depression, and falls . Referrals and appointments  In addition, I have reviewed and discussed with patient certain preventive protocols, quality metrics, and best practice recommendations. A written personalized care plan for preventive services as well as general preventive health recommendations were provided to patient.     Wynetta Fines, RN  12/30/2016

## 2016-12-30 NOTE — Patient Instructions (Addendum)
Chad [name] , Thank you for taking time to come for your Medicare Wellness Visit. I appreciate your ongoing commitment to your health goals. Please review the following plan we discussed and let me know if I can assist you in the future.  Had your high dose flu vaccine today    A Tetanus is recommended every 10 years. Medicare covers a tetanus if you have a cut or wound; otherwise, there may be a charge. If you had not had a tetanus with pertusses, known as the Tdap, you can take this anytime.    Medicare now request all "baby boomers" test for possible exposure to Hepatitis C. Many may have been exposed due to dental work, tatoo's, vaccinations when young. The Hepatitis C virus is dormant for many years and then sometimes will cause liver cancer. If you gave blood in the past 15 years, you were most likely checked for Hep C. If you rec'd blood; you may want to consider testing or if you are high risk for any other reason.   Medicare will pay for a hearing screen Deaf & Hard of Hearing [facility] Services - can assist with hearing aid x 1  No reviews  [address] Office  [place] [address]  [phone]  [facility]   Shingrix is a vaccine for the prevention of Shingles in Adults 50 and older.  If you are on Medicare, you can request a prescription from your doctor to be filled at a pharmacy.  Please check with your benefits regarding applicable copays or out of pocket expenses.  The Shingrix is given in 2 vaccines approx 8 weeks apart. You must receive the 2nd dose prior to 6 months from receipt of the first.      These are the goals we discussed: Goals    . Weight (lb) < 200 lb (90.7 kg)          Losing 10lbs will help the waist line Try to eat lean and green /  Check out  online nutrition programs as GumSearch.nl and http://vang.com/; fit13me; Look for foods with "whole" wheat; bran; oatmeal etc Shot at the farmer's markets in season for fresher  choices  Watch for "hydrogenated" on the label of oils which are trans-fats.  Watch for "high fructose corn syrup" in snacks, yogurt or ketchup  Meats have less marbling; bright colored fruits and vegetables;  Canned; dump out liquid and wash vegetables. Be mindful of what we are eating  Portion control is essential to a health weight! Sit down; take a break and enjoy your meal; take smaller bites; put the fork down between bites;  It takes 20 minutes to get full; so check in with your fullness cues and stop eating when you start to fill full              This is a list of the screening recommended for you and due dates:  Health Maintenance  Topic Date Due  .  Hepatitis C: One time screening is recommended by Center for Disease Control  (CDC) for  adults born from [birth year] through 1966.   17-Dec-1950  . Tetanus Vaccine  11/14/2012  . Flu Shot  10/29/2016  . Colon Cancer Screening  05/10/2019  . Pneumonia vaccines  Completed   Prevention of falls: Remove rugs or any tripping hazards in the home Use Non slip mats in bathtubs and showers Placing grab bars next to the toilet and or shower Placing handrails  on both sides of the stair way Adding extra lighting in the home.   Personal safety issues reviewed:  1. Consider starting a community watch program per [facility] 2.  Changes batteries is smoke detector and/or carbon monoxide detector  3.  If you have firearms; keep them in a safe place 4.  Wear protection when in the sun; Always wear sunscreen or a hat; It is good to have your doctor check your skin annually or review any new areas of concern 5. Driving safety; Keep in the right lane; stay 3 car lengths behind the car in front of you on the highway; look 3 times prior to pulling out; carry your cell phone everywhere you go!    Learn about the Yellow Dot program:  The program allows first responders at your emergency to have access to who your physician is, as well as  your medications and medical conditions.  Citizens requesting the Yellow Dot Packages should contact Master Corporal Nunzio Cobbs at the [facility] [phone] for the first week of the program and beginning the week after Easter citizens should contact their Scientist, physiological.    Falls can be the main reason people lose their independence Think before you CLIMB  4 things you can do to prevent falls 1. Begin an exercise program to improve your leg strength and balance 2. Ask the doctor to review medicines for fall risk 3. Get an annual eye check up and update your eye glasses;  (the [employer] still assist with eyewear:  Reviewed for annual vision exam;The [facility] assistance for eyewear is coordinated through [facility]; Please call Cherlyn Labella at [phone] 4. Make your home safer by: Removing clutter and tripping hazzards Putting railing on stairs and adding grab bars to the bathroom  Have good lighting; especially on stairs and at night when getting up to the bathroom   Exercise; including walking can assist with maintaining tone and balance and help prevent falls as you age.        Health Maintenance, Male A healthy lifestyle and preventive care is important for your health and wellness. Ask your health care provider about what schedule of regular examinations is right for you. What should I know about weight and diet? Eat a Healthy Diet  Eat plenty of vegetables, fruits, whole grains, low-fat dairy products, and lean protein.  Do not eat a lot of foods high in solid fats, added sugars, or salt.  Maintain a Healthy Weight Regular exercise can help you achieve or maintain a healthy weight. You should:  Do at least 150 minutes of exercise each week. The exercise should increase your heart rate and make you sweat (moderate-intensity exercise).  Do strength-training exercises at least twice a week.  Watch Your Levels of Cholesterol and Blood  Lipids  Have your blood tested for lipids and cholesterol every 5 years starting at 67 years of age. If you are at high risk for heart disease, you should start having your blood tested when you are 67 years old. You may need to have your cholesterol levels checked more often if: ? Your lipid or cholesterol levels are high. ? You are older than 67 years of age. ? You are at high risk for heart disease.  What should I know about cancer screening? Many types of cancers can be detected early and may often be prevented. Lung Cancer  You should be screened every year for lung cancer if: ? You are a  current smoker who has smoked for at least 30 years. ? You are a former smoker who has quit within the past 15 years.  Talk to your health care provider about your screening options, when you should start screening, and how often you should be screened.  Colorectal Cancer  Routine colorectal cancer screening usually begins at 67 years of age and should be repeated every 5-10 years until you are 67 years old. You may need to be screened more often if early forms of precancerous polyps or small growths are found. Your health care provider may recommend screening at an earlier age if you have risk factors for colon cancer.  Your health care provider may recommend using home test kits to check for hidden blood in the stool.  A small camera at the end of a tube can be used to examine your colon (sigmoidoscopy or colonoscopy). This checks for the earliest forms of colorectal cancer.  Prostate and Testicular Cancer  Depending on your age and overall health, your health care provider may do certain tests to screen for prostate and testicular cancer.  Talk to your health care provider about any symptoms or concerns you have about testicular or prostate cancer.  Skin Cancer  Check your skin from head to toe regularly.  Tell your health care provider about any new moles or changes in moles, especially  if: ? There is a change in a mole's size, shape, or color. ? You have a mole that is larger than a pencil eraser.  Always use sunscreen. Apply sunscreen liberally and repeat throughout the day.  Protect yourself by wearing long sleeves, pants, a wide-brimmed hat, and sunglasses when outside.  What should I know about heart disease, diabetes, and high blood pressure?  If you are 19-72 years of age, have your blood pressure checked every 3-5 years. If you are 46 years of age or older, have your blood pressure checked every year. You should have your blood pressure measured twice-once when you are at a hospital or clinic, and once when you are not at a hospital or clinic. Record the average of the two measurements. To check your blood pressure when you are not at a hospital or clinic, you can use: ? An automated blood pressure machine at a pharmacy. ? A home blood pressure monitor.  Talk to your health care provider about your target blood pressure.  If you are between 67-31 years old, ask your health care provider if you should take aspirin to prevent heart disease.  Have regular diabetes screenings by checking your fasting blood sugar level. ? If you are at a normal weight and have a low risk for diabetes, have this test once every three years after the age of 66. ? If you are overweight and have a high risk for diabetes, consider being tested at a younger age or more often.  A one-time screening for abdominal aortic aneurysm (AAA) by ultrasound is recommended for men aged 45-75 years who are current or former smokers. What should I know about preventing infection? Hepatitis B If you have a higher risk for hepatitis B, you should be screened for this virus. Talk with your health care provider to find out if you are at risk for hepatitis B infection. Hepatitis C Blood testing is recommended for:  Everyone born from [birth year] through 1965.  Anyone with known risk factors for hepatitis  C.  Sexually Transmitted Diseases (STDs)  You should be screened each year for STDs including  gonorrhea and chlamydia if: ? You are sexually active and are younger than 68 years of age. ? You are older than 67 years of age and your health care provider tells you that you are at risk for this type of infection. ? Your sexual activity has changed since you were last screened and you are at an increased risk for chlamydia or gonorrhea. Ask your health care provider if you are at risk.  Talk with your health care provider about whether you are at high risk of being infected with HIV. Your health care provider may recommend a prescription medicine to help prevent HIV infection.  What else can I do?  Schedule regular health, dental, and eye exams.  Stay current with your vaccines (immunizations).  Do not use any tobacco products, such as cigarettes, chewing tobacco, and e-cigarettes. If you need help quitting, ask your health care provider.  Limit alcohol intake to no more than 2 drinks per day. One drink equals 12 ounces of beer, 5 ounces of wine, or 1 ounces of hard liquor.  Do not use street drugs.  Do not share needles.  Ask your health care provider for help if you need support or information about quitting drugs.  Tell your health care provider if you often feel depressed.  Tell your health care provider if you have ever been abused or do not feel safe at home. This information is not intended to replace advice given to you by your health care provider. Make sure you discuss any questions you have with your health care provider. Document Released: 09/13/2007 Document Revised: 11/14/2015 Document Reviewed: 12/19/2014 Elsevier Interactive Patient Education  2018 [employer].     Hearing Loss Hearing loss is a partial or total loss of the ability to hear. This can be temporary or permanent, and it can happen in one or both ears. Hearing loss may be referred to as  deafness. Medical care is necessary to treat hearing loss properly and to prevent the condition from getting worse. Your hearing may partially or completely come back, depending on what caused your hearing loss and how severe it is. In some cases, hearing loss is permanent. What are the causes? Common causes of hearing loss include:  Too much wax in the ear canal.  Infection of the ear canal or middle ear.  Fluid in the middle ear.  Injury to the ear or surrounding area.  An object stuck in the ear.  Prolonged exposure to loud sounds, such as music.  Less common causes of hearing loss include:  Tumors in the ear.  Viral or bacterial infections, such as meningitis.  A hole in the eardrum (perforated eardrum).  Problems with the hearing nerve that sends signals between the brain and the ear.  Certain medicines.  What are the signs or symptoms? Symptoms of this condition may include:  Difficulty telling the difference between sounds.  Difficulty following a conversation when there is background noise.  Lack of response to sounds in your environment. This may be most noticeable when you do not respond to startling sounds.  Needing to turn up the volume on the television, radio, etc.  Ringing in the ears.  Dizziness.  Pain in the ears.  How is this diagnosed? This condition is diagnosed based on a physical exam and a hearing test (audiometry). The audiometry test will be performed by a hearing specialist (audiologist). You may also be referred to an ear, nose, and throat (ENT) specialist (otolaryngologist). How is this  treated? Treatment for recent onset of hearing loss may include:  Ear wax removal.  Being prescribed medicines to prevent infection (antibiotics).  Being prescribed medicines to reduce inflammation (corticosteroids).  Follow these instructions at home:  If you were prescribed an antibiotic medicine, take it as told by your health care provider. Do  not stop taking the antibiotic even if you start to feel better.  Take over-the-counter and prescription medicines only as told by your health care provider.  Avoid loud noises.  Return to your normal activities as told by your health care provider. Ask your health care provider what activities are safe for you.  Keep all follow-up visits as told by your health care provider. This is important. Contact a health care provider if:  You feel dizzy.  You develop new symptoms.  You vomit or feel nauseous.  You have a fever. Get help right away if:  You develop sudden changes in your vision.  You have severe ear pain.  You have new or increased weakness.  You have a severe headache. This information is not intended to replace advice given to you by your health care provider. Make sure you discuss any questions you have with your health care provider. Document Released: 03/17/2005 Document Revised: 08/23/2015 Document Reviewed: 08/02/2014 Elsevier Interactive Patient Education  2018 [employer].

## 2017-01-07 ENCOUNTER — Encounter: Payer: Self-pay | Admitting: Cardiology

## 2017-01-07 ENCOUNTER — Ambulatory Visit (INDEPENDENT_AMBULATORY_CARE_PROVIDER_SITE_OTHER): Payer: Medicare Other | Admitting: Cardiology

## 2017-01-07 VITALS — BP 110/70 | HR 65 | Ht 74.0 in | Wt 268.8 lb

## 2017-01-07 DIAGNOSIS — E785 Hyperlipidemia, unspecified: Secondary | ICD-10-CM | POA: Diagnosis not present

## 2017-01-07 DIAGNOSIS — I2510 Atherosclerotic heart disease of native coronary artery without angina pectoris: Secondary | ICD-10-CM | POA: Diagnosis not present

## 2017-01-07 LAB — LIPID PANEL
CHOL/HDL RATIO: 3.7 ratio (ref 0.0–5.0)
Cholesterol, Total: 173 mg/dL (ref 100–199)
HDL: 47 mg/dL (ref >39)
LDL CALC: 113 mg/dL — AB (ref 0–99)
Triglycerides: 67 mg/dL (ref 0–149)
VLDL Cholesterol Cal: 13 mg/dL (ref 5–40)
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
## 2017-01-07 NOTE — Patient Instructions (Signed)
Medication Instructions:  Your physician recommends that you continue on your current medications as directed. Please refer to the Current Medication list given to you today.  Labwork: Lipid profile today  Testing/Procedures: None ordered  Follow-Up: Your physician wants you to follow-up in: 1 year with Dr. Curt Bears.  You will receive a reminder letter in the mail two months in advance. If you don't receive a letter, please call our office to schedule the follow-up appointment.  -- If you need a refill on your cardiac medications before your next appointment, please call your pharmacy. --  Thank you for choosing [facility]!!   Trinidad Curet, RN [phone]  Any Other Special Instructions Will Be Listed Below (If Applicable).

## 2017-01-21 DIAGNOSIS — Z8546 Personal history of malignant neoplasm of prostate: Secondary | ICD-10-CM | POA: Diagnosis not present

## 2017-01-21 DIAGNOSIS — C61 Malignant neoplasm of prostate: Secondary | ICD-10-CM | POA: Diagnosis not present

## 2017-04-03 ENCOUNTER — Encounter: Payer: Self-pay | Admitting: *Deleted

## 2017-05-07 ENCOUNTER — Ambulatory Visit (INDEPENDENT_AMBULATORY_CARE_PROVIDER_SITE_OTHER): Payer: Medicare Other | Admitting: Internal Medicine

## 2017-05-07 ENCOUNTER — Encounter: Payer: Self-pay | Admitting: Internal Medicine

## 2017-05-07 VITALS — BP 114/70 | HR 70 | Ht 74.0 in | Wt 275.0 lb

## 2017-05-07 DIAGNOSIS — J479 Bronchiectasis, uncomplicated: Secondary | ICD-10-CM

## 2017-05-07 DIAGNOSIS — R0600 Dyspnea, unspecified: Secondary | ICD-10-CM

## 2017-05-07 NOTE — Patient Instructions (Signed)
ICD-10-CM   1. Dyspnea, unspecified type R06.00   2. Bronchiectasis without complication (HCC) P03.4     Glad shortness of breath better Glad no cough  followup 1 year or sooner if needed
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
## 2017-07-09 ENCOUNTER — Encounter: Payer: Self-pay | Admitting: Adult Health

## 2017-07-09 ENCOUNTER — Ambulatory Visit (INDEPENDENT_AMBULATORY_CARE_PROVIDER_SITE_OTHER): Payer: Medicare Other | Admitting: Adult Health

## 2017-07-09 VITALS — BP 110/72 | Temp 97.8°F | Ht 73.5 in | Wt 273.0 lb

## 2017-07-09 DIAGNOSIS — R7309 Other abnormal glucose: Secondary | ICD-10-CM

## 2017-07-09 DIAGNOSIS — E785 Hyperlipidemia, unspecified: Secondary | ICD-10-CM | POA: Diagnosis not present

## 2017-07-09 DIAGNOSIS — Z1159 Encounter for screening for other viral diseases: Secondary | ICD-10-CM

## 2017-07-09 DIAGNOSIS — E039 Hypothyroidism, unspecified: Secondary | ICD-10-CM

## 2017-07-09 LAB — CBC WITH DIFFERENTIAL/PLATELET
BASOS ABS: 0 10*3/uL (ref 0.0–0.1)
BASOS PCT: 0.6 % (ref 0.0–3.0)
Eosinophils Absolute: 0.2 10*3/uL (ref 0.0–0.7)
Eosinophils Relative: 3.8 % (ref 0.0–5.0)
HEMATOCRIT: 38.8 % — AB (ref 39.0–52.0)
Hemoglobin: 12.9 g/dL — ABNORMAL LOW (ref 13.0–17.0)
LYMPHS ABS: 1.9 10*3/uL (ref 0.7–4.0)
Lymphocytes Relative: 31.8 % (ref 12.0–46.0)
MCHC: 33.2 g/dL (ref 30.0–36.0)
MCV: 89.1 fl (ref 78.0–100.0)
MONOS PCT: 8.5 % (ref 3.0–12.0)
Monocytes Absolute: 0.5 10*3/uL (ref 0.1–1.0)
NEUTROS ABS: 3.3 10*3/uL (ref 1.4–7.7)
NEUTROS PCT: 55.3 % (ref 43.0–77.0)
PLATELETS: 140 10*3/uL — AB (ref 150.0–400.0)
RBC: 4.36 Mil/uL (ref 4.22–5.81)
RDW: 15.6 % — AB (ref 11.5–15.5)
WBC: 5.9 10*3/uL (ref 4.0–10.5)

## 2017-07-09 LAB — HEPATIC FUNCTION PANEL
ALBUMIN: 3.8 g/dL (ref 3.5–5.2)
ALT: 16 U/L (ref 0–53)
AST: 18 U/L (ref 0–37)
Alkaline Phosphatase: 50 U/L (ref 39–117)
BILIRUBIN DIRECT: 0.1 mg/dL (ref 0.0–0.3)
Total Bilirubin: 0.4 mg/dL (ref 0.2–1.2)
Total Protein: 7.2 g/dL (ref 6.0–8.3)

## 2017-07-09 LAB — BASIC METABOLIC PANEL
BUN: 13 mg/dL (ref 6–23)
CHLORIDE: 105 meq/L (ref 96–112)
CO2: 28 meq/L (ref 19–32)
Calcium: 9 mg/dL (ref 8.4–10.5)
Creatinine, Ser: 0.69 mg/dL (ref 0.40–1.50)
GFR: 146.56 mL/min (ref >60.00)
GLUCOSE: 92 mg/dL (ref 70–99)
Potassium: 4.1 mEq/L (ref 3.5–5.1)
SODIUM: 138 meq/L (ref 135–145)

## 2017-07-09 LAB — LIPID PANEL
CHOLESTEROL: 188 mg/dL (ref 0–200)
HDL: 46.2 mg/dL (ref >39.00)
LDL Cholesterol: 130 mg/dL — ABNORMAL HIGH (ref 0–99)
NonHDL: 141.85
TRIGLYCERIDES: 61 mg/dL (ref 0.0–149.0)
Total CHOL/HDL Ratio: 4
VLDL: 12.2 mg/dL (ref 0.0–40.0)

## 2017-07-09 LAB — HEMOGLOBIN A1C: Hgb A1c MFr Bld: 5.8 % (ref 4.6–6.5)

## 2017-07-09 LAB — T4, FREE: Free T4: 0.82 ng/dL (ref 0.60–1.60)
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
## 2017-07-10 LAB — HEPATITIS C ANTIBODY
HEP C AB: NONREACTIVE
SIGNAL TO CUT-OFF: 0.03 (ref <1.00)

## 2017-07-10 LAB — TSH: TSH: 3.06 u[IU]/mL (ref 0.35–4.50)

## 2017-07-10 LAB — T3, FREE: T3 FREE: 2.7 pg/mL (ref 2.3–4.2)

## 2017-07-28 DIAGNOSIS — R0981 Nasal congestion: Secondary | ICD-10-CM | POA: Diagnosis not present

## 2017-07-28 DIAGNOSIS — G4733 Obstructive sleep apnea (adult) (pediatric): Secondary | ICD-10-CM | POA: Diagnosis not present

## 2017-07-29 ENCOUNTER — Telehealth: Payer: Self-pay | Admitting: Cardiology

## 2017-07-29 NOTE — Telephone Encounter (Signed)
New message   STAT if patient feels like he/she is going to faint   1) Are you dizzy now? Patient states he has been dizzy off and on for the last 2 weeks, requesting call from nurse  2) Do you feel faint or have you passed out? NO  3) Do you have any other symptoms? NO  4) Have you checked your HR and BP (record if available)? Normal per patient  No chest pain  No shortness of breath

## 2017-07-29 NOTE — Telephone Encounter (Addendum)
Pt reports a couple of dizzy episodes in the last few weeks.  He states that is has only occurred while sitting, never with standing/movement. Patient denies any new medications/changes recently. Patient denies any palpitations/abnormal heart rhythms. Advised patient to contact PCP to discuss/evaluate further.  Advised to contact office to schedule appt if PCP feels cardiology needs to see. Patient verbalized understanding and agreeable to plan.

## 2017-12-31 ENCOUNTER — Ambulatory Visit: Payer: Medicare Other

## 2018-01-12 ENCOUNTER — Ambulatory Visit: Payer: Medicare Other | Admitting: Cardiology
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
## 2018-01-14 ENCOUNTER — Ambulatory Visit (INDEPENDENT_AMBULATORY_CARE_PROVIDER_SITE_OTHER): Payer: Medicare Other

## 2018-01-14 VITALS — BP 110/68 | HR 72 | Ht 74.0 in | Wt 272.6 lb

## 2018-01-14 DIAGNOSIS — Z0000 Encounter for general adult medical examination without abnormal findings: Secondary | ICD-10-CM | POA: Diagnosis not present

## 2018-01-14 NOTE — Patient Instructions (Addendum)
Chad [name] , Thank you for taking time to come for your Medicare Wellness Visit. I appreciate your ongoing commitment to your health goals. Please review the following plan we discussed and let me know if I can assist you in the future.   Will take your flu vaccine prior to November. Remember , it takes at least 2 weeks to build antibodies    Will take tetanus if you get injured A Tetanus is recommended every 10 years. Medicare covers a tetanus if you have a cut or wound; otherwise, there may be a charge. If you had not had a tetanus with pertusses, known as the Tdap, you can take this anytime.   Shingrix is a vaccine for the prevention of Shingles in Adults 50 and older.  If you are on Medicare, the shingrix is covered under your Part D plan, so you will take both of the vaccines in the series at your pharmacy. Please check with your benefits regarding applicable copays or out of pocket expenses.  The Shingrix is given in 2 vaccines approx 8 weeks apart. You must receive the 2nd dose prior to 6 months from receipt of the first. Please have the pharmacist print out you Immunization  dates for our office records    Deaf & Hard of Hearing Division Services - can assist with hearing aid x 1  No reviews  [facility]  [place] [id number]  [phone]  http://clienthiadev.devcloud.acquia-sites.com/sites/default/files/hearingpedia/Guide_How_to_Buy_Hearing_Aids.pdf    These are the goals we discussed: Goals    . Weight (lb) < 200 lb (90.7 kg)     Losing 10lbs will help the waist line Try to eat lean and green /  Check out  online nutrition programs as GumSearch.nl and http://vang.com/; fit3me; Look for foods with "whole" wheat; bran; oatmeal etc Shot at the farmer's markets in season for fresher choices  Watch for "hydrogenated" on the label of oils which are trans-fats.  Watch for "high fructose corn syrup" in snacks, yogurt or ketchup  Meats have less marbling;  bright colored fruits and vegetables;  Canned; dump out liquid and wash vegetables. Be mindful of what we are eating  Portion control is essential to a health weight! Sit down; take a break and enjoy your meal; take smaller bites; put the fork down between bites;  It takes 20 minutes to get full; so check in with your fullness cues and stop eating when you start to fill full              This is a list of the screening recommended for you and due dates:  Health Maintenance  Topic Date Due  . Tetanus Vaccine  11/14/2012  . Flu Shot  10/29/2017  . Colon Cancer Screening  05/10/2019  .  Hepatitis C: One time screening is recommended by Center for Disease Control  (CDC) for  adults born from [birth year] through 1966.   Completed  . Pneumonia vaccines  Completed      Fall Prevention in the Home Falls can cause injuries. They can happen to people of all ages. There are many things you can do to make your home safe and to help prevent falls. What can I do on the outside of my home?  Regularly fix the edges of walkways and driveways and fix any cracks.  Remove anything that might make you trip as you walk through a door, such as a raised step or threshold.  Trim any bushes or trees on the path  to your home.  Use bright outdoor lighting.  Clear any walking paths of anything that might make someone trip, such as rocks or tools.  Regularly check to see if handrails are loose or broken. Make sure that both sides of any steps have handrails.  Any raised decks and porches should have guardrails on the edges.  Have any leaves, snow, or ice cleared regularly.  Use sand or salt on walking paths during winter.  Clean up any spills in your garage right away. This includes oil or grease spills. What can I do in the bathroom?  Use night lights.  Install grab bars by the toilet and in the tub and shower. Do not use towel bars as grab bars.  Use non-skid mats or decals in the tub or  shower.  If you need to sit down in the shower, use a plastic, non-slip stool.  Keep the floor dry. Clean up any water that spills on the floor as soon as it happens.  Remove soap buildup in the tub or shower regularly.  Attach bath mats securely with double-sided non-slip rug tape.  Do not have throw rugs and other things on the floor that can make you trip. What can I do in the bedroom?  Use night lights.  Make sure that you have a light by your bed that is easy to reach.  Do not use any sheets or blankets that are too big for your bed. They should not hang down onto the floor.  Have a firm chair that has side arms. You can use this for support while you get dressed.  Do not have throw rugs and other things on the floor that can make you trip. What can I do in the kitchen?  Clean up any spills right away.  Avoid walking on wet floors.  Keep items that you use a lot in easy-to-reach places.  If you need to reach something above you, use a strong step stool that has a grab bar.  Keep electrical cords out of the way.  Do not use floor polish or wax that makes floors slippery. If you must use wax, use non-skid floor wax.  Do not have throw rugs and other things on the floor that can make you trip. What can I do with my stairs?  Do not leave any items on the stairs.  Make sure that there are handrails on both sides of the stairs and use them. Fix handrails that are broken or loose. Make sure that handrails are as long as the stairways.  Check any carpeting to make sure that it is firmly attached to the stairs. Fix any carpet that is loose or worn.  Avoid having throw rugs at the top or bottom of the stairs. If you do have throw rugs, attach them to the floor with carpet tape.  Make sure that you have a light switch at the top of the stairs and the bottom of the stairs. If you do not have them, ask someone to add them for you. What else can I do to help prevent  falls?  Wear shoes that: ? Do not have high heels. ? Have rubber bottoms. ? Are comfortable and fit you well. ? Are closed at the toe. Do not wear sandals.  If you use a stepladder: ? Make sure that it is fully opened. Do not climb a closed stepladder. ? Make sure that both sides of the stepladder are locked into place. ? Ask someone  to hold it for you, if possible.  Clearly mark and make sure that you can see: ? Any grab bars or handrails. ? First and last steps. ? Where the edge of each step is.  Use tools that help you move around (mobility aids) if they are needed. These include: ? Canes. ? Walkers. ? Scooters. ? Crutches.  Turn on the lights when you go into a dark area. Replace any light bulbs as soon as they burn out.  Set up your furniture so you have a clear path. Avoid moving your furniture around.  If any of your floors are uneven, fix them.  If there are any pets around you, be aware of where they are.  Review your medicines with your doctor. Some medicines can make you feel dizzy. This can increase your chance of falling. Ask your doctor what other things that you can do to help prevent falls. This information is not intended to replace advice given to you by your health care provider. Make sure you discuss any questions you have with your health care provider. Document Released: 01/11/2009 Document Revised: 08/23/2015 Document Reviewed: 04/21/2014 Elsevier Interactive Patient Education  2018 [place] Maintenance, Male A healthy lifestyle and preventive care is important for your health and wellness. Ask your health care provider about what schedule of regular examinations is right for you. What should I know about weight and diet? Eat a Healthy Diet  Eat plenty of vegetables, fruits, whole grains, low-fat dairy products, and lean protein.  Do not eat a lot of foods high in solid fats, added sugars, or salt.  Maintain a Healthy  Weight Regular exercise can help you achieve or maintain a healthy weight. You should:  Do at least 150 minutes of exercise each week. The exercise should increase your heart rate and make you sweat (moderate-intensity exercise).  Do strength-training exercises at least twice a week.  Watch Your Levels of Cholesterol and Blood Lipids  Have your blood tested for lipids and cholesterol every 5 years starting at 67 years of age. If you are at high risk for heart disease, you should start having your blood tested when you are 68 years old. You may need to have your cholesterol levels checked more often if: ? Your lipid or cholesterol levels are high. ? You are older than 68 years of age. ? You are at high risk for heart disease.  What should I know about cancer screening? Many types of cancers can be detected early and may often be prevented. Lung Cancer  You should be screened every year for lung cancer if: ? You are a current smoker who has smoked for at least 30 years. ? You are a former smoker who has quit within the past 15 years.  Talk to your health care provider about your screening options, when you should start screening, and how often you should be screened.  Colorectal Cancer  Routine colorectal cancer screening usually begins at 68 years of age and should be repeated every 5-10 years until you are 68 years old. You may need to be screened more often if early forms of precancerous polyps or small growths are found. Your health care provider may recommend screening at an earlier age if you have risk factors for colon cancer.  Your health care provider may recommend using home test kits to check for hidden blood in the stool.  A small camera at the end of a tube can be used to  examine your colon (sigmoidoscopy or colonoscopy). This checks for the earliest forms of colorectal cancer.  Prostate and Testicular Cancer  Depending on your age and overall health, your health care  provider may do certain tests to screen for prostate and testicular cancer.  Talk to your health care provider about any symptoms or concerns you have about testicular or prostate cancer.  Skin Cancer  Check your skin from head to toe regularly.  Tell your health care provider about any new moles or changes in moles, especially if: ? There is a change in a mole's size, shape, or color. ? You have a mole that is larger than a pencil eraser.  Always use sunscreen. Apply sunscreen liberally and repeat throughout the day.  Protect yourself by wearing long sleeves, pants, a wide-brimmed hat, and sunglasses when outside.  What should I know about heart disease, diabetes, and high blood pressure?  If you are 34-52 years of age, have your blood pressure checked every 3-5 years. If you are 37 years of age or older, have your blood pressure checked every year. You should have your blood pressure measured twice-once when you are at a hospital or clinic, and once when you are not at a hospital or clinic. Record the average of the two measurements. To check your blood pressure when you are not at a hospital or clinic, you can use: ? An automated blood pressure machine at a pharmacy. ? A home blood pressure monitor.  Talk to your health care provider about your target blood pressure.  If you are between 68-24 years old, ask your health care provider if you should take aspirin to prevent heart disease.  Have regular diabetes screenings by checking your fasting blood sugar level. ? If you are at a normal weight and have a low risk for diabetes, have this test once every three years after the age of 23. ? If you are overweight and have a high risk for diabetes, consider being tested at a younger age or more often.  A one-time screening for abdominal aortic aneurysm (AAA) by ultrasound is recommended for men aged 63-75 years who are current or former smokers. What should I know about preventing  infection? Hepatitis B If you have a higher risk for hepatitis B, you should be screened for this virus. Talk with your health care provider to find out if you are at risk for hepatitis B infection. Hepatitis C Blood testing is recommended for:  Everyone born from [birth year] through 1966.  Anyone with known risk factors for hepatitis C.  Sexually Transmitted Diseases (STDs)  You should be screened each year for STDs including gonorrhea and chlamydia if: ? You are sexually active and are younger than 68 years of age. ? You are older than 68 years of age and your health care provider tells you that you are at risk for this type of infection. ? Your sexual activity has changed since you were last screened and you are at an increased risk for chlamydia or gonorrhea. Ask your health care provider if you are at risk.  Talk with your health care provider about whether you are at high risk of being infected with HIV. Your health care provider may recommend a prescription medicine to help prevent HIV infection.  What else can I do?  Schedule regular health, dental, and eye exams.  Stay current with your vaccines (immunizations).  Do not use any tobacco products, such as cigarettes, chewing tobacco, and e-cigarettes. If you need  help quitting, ask your health care provider.  Limit alcohol intake to no more than 2 drinks per day. One drink equals 12 ounces of beer, 5 ounces of wine, or 1 ounces of hard liquor.  Do not use street drugs.  Do not share needles.  Ask your health care provider for help if you need support or information about quitting drugs.  Tell your health care provider if you often feel depressed.  Tell your health care provider if you have ever been abused or do not feel safe at home. This information is not intended to replace advice given to you by your health care provider. Make sure you discuss any questions you have with your health care provider. Document Released:  09/13/2007 Document Revised: 11/14/2015 Document Reviewed: 12/19/2014 Elsevier Interactive Patient Education  [employer].

## 2018-01-14 NOTE — Progress Notes (Signed)
I have reviewed documentation for AWV and Advance Care Planning provided by the health coach and agree with documentation. I was immediately available for questions.  

## 2018-01-19 ENCOUNTER — Ambulatory Visit (INDEPENDENT_AMBULATORY_CARE_PROVIDER_SITE_OTHER): Payer: Medicare Other | Admitting: Cardiovascular Disease

## 2018-01-19 ENCOUNTER — Encounter: Payer: Self-pay | Admitting: Cardiovascular Disease

## 2018-01-19 VITALS — BP 126/82 | HR 78 | Ht 74.0 in | Wt 273.6 lb

## 2018-01-19 DIAGNOSIS — I77810 Thoracic aortic ectasia: Secondary | ICD-10-CM | POA: Insufficient documentation

## 2018-01-19 DIAGNOSIS — I6523 Occlusion and stenosis of bilateral carotid arteries: Secondary | ICD-10-CM | POA: Diagnosis not present

## 2018-01-19 DIAGNOSIS — E785 Hyperlipidemia, unspecified: Secondary | ICD-10-CM

## 2018-01-19 DIAGNOSIS — I712 Thoracic aortic aneurysm, without rupture: Secondary | ICD-10-CM

## 2018-01-19 DIAGNOSIS — I7121 Aneurysm of the ascending aorta, without rupture: Secondary | ICD-10-CM

## 2018-01-19 DIAGNOSIS — I2510 Atherosclerotic heart disease of native coronary artery without angina pectoris: Secondary | ICD-10-CM

## 2018-01-19 DIAGNOSIS — I6529 Occlusion and stenosis of unspecified carotid artery: Secondary | ICD-10-CM

## 2018-01-19 HISTORY — DX: Occlusion and stenosis of unspecified carotid artery: I65.29

## 2018-01-19 HISTORY — DX: Thoracic aortic ectasia: I77.810

## 2018-01-19 NOTE — Progress Notes (Signed)
Cardiology Office Note   Date:  01/19/2018   ID:  Chad [name], DOB Jul 30, 1950, MRN [id number]  PCP:  Chad Peng, NP  Cardiologist:   Chad Latch, MD   Chief Complaint  [name] presents with  . Follow-up  . Shortness of Breath    on exertion/ at randomly.   . Leg Pain    cramping in legs at night/ randomly.     History of Present Illness: Chad [name] is a 67 y.o. male with asymptomatic coronary calcifications, interstitial lung disease/pulmonary fibrosis, OSA on CPAP, hyperlipidemia, and diabetes who presents for follow-up.  He initially saw Dr. Curt [name] 10/2015.  He was referred because of a CT scan that showed coronary calcifications and calcified aortic valve.  At Chad time he was completely asymptomatic.  Fasting lipids revealed an LDL of 126.  A statin was considered but not started.  He was afraid that it would cause muscle aches.  He had an echo 12/2011 that revealed LVEF 55 to 60%, and a mildly dilated ascending aorta.  Carotid Dopplers 11/2013 revealed 1-39% stenosis bilaterally.  He had a CPX 02/2016 that revealed mild functional impairment in gas exchange and mild restrictive physiology.  It was felt that his imitations at peak exercise were mostly attributed to body habitus.  Chad [name] reports that he has been feeling well.  He has no chest pain or pressure.  He sometimes gets short of breath with exertion.  In Chad past he exercise regularly but has not been doing so lately.  He has not noted any lower extremity edema, orthopnea, or PND.  His only complaint is that he sometimes feels leg cramps.  This is most common when he lays in Chad bed.  He has no claudication with walking.  Past Medical History:  Diagnosis Date  . Arthritis   . ASTHMA 02/11/2007       . CARBUNCLE AND FURUNCLE OF LEG EXCEPT FOOT 08/14/2009   Qualifier: Diagnosis of  By: Chad [name]   . Carotid stenosis, asymptomatic 01/19/2018   1-39% on carotid Doppler 2015.   Marland Kitchen CARPAL TUNNEL  SYNDROME, LEFT 02/15/2009   Qualifier: Diagnosis of  By: Chad [name]   . Chronic interstitial lung disease ([place])    PULMONARY INFILTRATE  --  PULMOLOGIST-  DR CLANCE  . Chronic steroid use    INTERSTITIAL LUNG DISEASE  . COLONIC POLYPS, HX OF 12/23/2006   Qualifier: Diagnosis of  By: Chad [name] RMA, Chad [name]    . Complication of anesthesia    EMERGENCE COMBATIVENESS---  PLEASE REFER TO VATS 01-05-1012 PROCEDURE ,  DR Linna Caprice DOCUMENTED GRADE IV DIFFICULT VISUAL AIRWAY  . DIABETES MELLITUS, TYPE II, BORDERLINE 11/14/2008   Qualifier: Diagnosis of  By: Chad [name]   . Dyspnea on exertion   . Elevated PSA 08/13/2011  . GERD 12/23/2006   Qualifier: Diagnosis of  By: Chad [name] RMA, Chad [name]    . History of anal fissures   . History of hypothyroidism   . Hyperlipidemia 05/01/2016  . Hypothyroidism 12/23/2006   Qualifier: Diagnosis of  By: Chad [name] RMA, Chad [name]    . MEDIASTINAL LYMPHADENOPATHY 08/30/2007   Qualifier: Diagnosis of  By: Chad Gaskins MD, Chad [name]   . Mild ascending aorta dilatation (Chad [name]) 01/19/2018  . Oral thrush 04/22/2012  . OSA (obstructive sleep apnea) 12/23/2006   Pt prefers to stay on auto setting.    . Pneumonia 11/13/2011  . Prostate cancer ([place]) 06/09/12 bx   Adenocarcinoma  . PULMONARY  EDEMA 07/05/2007   Qualifier: Diagnosis of  By: Chad Gaskins MD, Chad [name]   . Pulmonary infiltrate 12/23/2011   H/o LN, pericardial effusion, pleural effusion unknown origin 2009.  ?resolved with prednisone?? Mediastinoscopy 2009:  Benign lymphoid hyperplasia Ct chest 11/2011: scattered GGO, interstitial changes, small pulmonary nodules, mild LN No response to prolonged course of abx. Autoimmune w/u:  ESR 78, but ACE/RF/DSDNA/CCP/ENA/SCL70/HP panel/ANCA all neg.  ANA low positive (prob neg). VATS bx 12/2011:  CIP with organizing pna.  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  V/Q 01/2012:  Ok +++ response to steroids (started 12/2011) PFT's 12/2012:  No obstruction, TLC 3.25 (42%), DLCO 44% Pulmonary rehab 2014 xrays and  symptoms greatly improved with steroids, and no recurrence off steroids   . RHINOSINUSITIS, ACUTE 01/29/2010   Qualifier: Diagnosis of  By: Chad [name]   . SHOULDER PAIN, LEFT 11/14/2008   Qualifier: Diagnosis of  By: Chad [name]   . Sinusitis 11/26/2010  . Sleep apnea    on cpap  . Thrombocytopenia ([place])     Past Surgical History:  Procedure Laterality Date  . CARDIOVASCULAR STRESS TEST  01-10-2008   NORMAL EXERCISE STRESS TEST AT GOOD WORKLOAD  . COLONOSCOPY  01-15-2009   polyps and tics  . LUNG BIOPSY    . MEDIASTINOSCOPY  07-12-2007   BILATERAL PLEURAL EFFUSIONS  . NASAL SINUS SURGERY    . POLYPECTOMY    . prostate biopsy  06/11/12   Adenocarcinoma  . RADIOACTIVE SEED IMPLANT N/A 12/23/2012   Procedure: RADIOACTIVE SEED IMPLANT;  Surgeon: Franchot Gallo, MD;  Location: [facility];  Service: Urology;  Laterality: N/A;   82   seeds implanted   . TRANSTHORACIC ECHOCARDIOGRAM  12-31-2011   NORMAL LVF/   EF  55-60%  . VIDEO ASSISTED THORACOSCOPY (VATS)/ LYMPH NODE SAMPLING Left 01-05-2012   LUNG AND LYMPH NODE BX'S (CHRONIC INTERSTITIAL PNEUMONIA)     Current Outpatient Medications  Medication Sig Dispense Refill  . Ibuprofen (ADVIL) 200 MG CAPS Take 600 mg by mouth as needed.      No current facility-administered medications for this visit.     Allergies:   Ivp dye [iodinated diagnostic agents] and Lactose intolerance (gi)    Social History:  Chad [name]  reports that he quit smoking about 31 years ago. His smoking use included cigarettes and cigars. He has a 2.00 pack-year smoking history. He has never used smokeless tobacco. He reports that he drinks about 2.0 standard drinks of alcohol per week. He reports that he does not use drugs.   Family History:  Chad [name]'s family history includes Alzheimer's disease in his maternal grandmother and mother; Bradycardia in his father; Cancer in his paternal uncle; Colon cancer in his father; Diabetes  in his daughter; Heart attack in his father; Hyperlipidemia in his sister; Hypertension in his mother; Nephrolithiasis in his father; Prostate cancer in his father; Sinusitis in his brother.    ROS:  Please see Chad history of present illness.   Otherwise, review of systems are positive for none.   All other systems are reviewed and negative.    PHYSICAL EXAM: VS:  BP 126/82   Pulse 78   Ht _0  (1.88 m)   Wt 273 lb 9.6 oz (124.1 kg)   BMI 35.13 kg/m  , BMI Body mass index is 35.13 kg/m. GENERAL:  Well appearing HEENT:  Pupils equal round and reactive, fundi not visualized, oral mucosa unremarkable NECK:  No jugular venous  distention, waveform within normal limits, carotid upstroke brisk and symmetric, no bruits, no thyromegaly LYMPHATICS:  No cervical adenopathy LUNGS:  Clear to auscultation bilaterally HEART:  RRR.  PMI not displaced or sustained,S1 and S2 within normal limits, no S3, no S4, no clicks, no rubs, no murmurs ABD:  Flat, positive bowel sounds normal in frequency in pitch, no bruits, no rebound, no guarding, no midline pulsatile mass, no hepatomegaly, no splenomegaly EXT:  2 plus pulses throughout, no edema, no cyanosis no clubbing SKIN:  No rashes no nodules NEURO:  Cranial nerves II through XII grossly intact, motor grossly intact throughout PSYCH:  Cognitively intact, oriented to person place and time    EKG:  EKG is ordered today. Chad ekg ordered today demonstrates sinus rhythm.  Rate 78 bpm.  RSR' in V1.  LVH.   Recent Labs: 07/09/2017: ALT 16; BUN 13; Creatinine, Ser 0.69; Hemoglobin 12.9; Platelets 140.0; Potassium 4.1; Sodium 138; TSH 3.06    Lipid Panel    Component Value Date/Time   CHOL 188 07/09/2017 0957   CHOL 173 01/07/2017 1151   TRIG 61.0 07/09/2017 0957   HDL 46.20 07/09/2017 0957   HDL 47 01/07/2017 1151   CHOLHDL 4 07/09/2017 0957   VLDL 12.2 07/09/2017 0957   LDLCALC 130 (H) 07/09/2017 0957   LDLCALC 113 (H) 01/07/2017 1151      Wt  Readings from Last 3 Encounters:  01/19/18 273 lb 9.6 oz (124.1 kg)  01/14/18 272 lb 9 oz (123.6 kg)  07/09/17 273 lb (123.8 kg)      ASSESSMENT AND PLAN:  # Asymptomatic coronary calcification:  # Hyperlipidemia: Chad [name] has no symptoms concerning for ischemia.  His LDL has been greater than 70.  Will repeat lipids today.  He is willing to try a statin if they remain elevated.  We also discussed increasing his exercise to 150 minutes/week.  We talked about limiting his fried foods, fatty foods, red meat, and cheese.  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  Repeat echo.  # Mild carotid stenosis:  1-39% 11/2013.  Repeat carotid Dopplers.  Lipid management as above.    Current medicines are reviewed at length with Chad [name] today.  Chad [name] does not have concerns regarding medicines.  Chad following changes have been made:  no change  Labs/ tests ordered today include:   Orders Placed This Encounter  Procedures  . Lipid panel  . Comprehensive metabolic panel  . EKG 12-Lead  . ECHOCARDIOGRAM COMPLETE     Disposition:   FU with Jashua Knaak C. Oval Linsey, MD, [facility] in 1 year.      Signed, Marcelus Dubberly C. Oval Linsey, MD, [facility]  01/19/2018 12:01 PM    [facility]

## 2018-01-19 NOTE — Patient Instructions (Signed)
Medication Instructions:  Your physician recommends that you continue on your current medications as directed. Please refer to the Current Medication list given to you today. If you need a refill on your cardiac medications before your next appointment, please call your pharmacy.   Lab work: LP/CMET TODAY  If you have labs (blood work) drawn today and your tests are completely normal, you will receive your results only by: Marland Kitchen MyChart Message (if you have MyChart) OR . A paper copy in the mail If you have any lab test that is abnormal or we need to change your treatment, we will call you to review the results.  Testing/Procedures: Your physician has requested that you have an echocardiogram. Echocardiography is a painless test that uses sound waves to create images of your heart. It provides your doctor with information about the size and shape of your heart and how well your heart's chambers and valves are working. This procedure takes approximately one hour. There are no restrictions for this procedure. [place] [address]  Your physician has requested that you have a carotid duplex. This test is an ultrasound of the carotid arteries in your neck. It looks at blood flow through these arteries that supply the brain with blood. Allow one hour for this exam. There are no restrictions or special instructions.   Follow-Up: At [facility], you and your health needs are our priority.  As part of our continuing mission to provide you with exceptional heart care, we have created designated Provider Care Teams.  These Care Teams include your primary Cardiologist (physician) and Advanced Practice Providers (APPs -  Physician Assistants and Nurse Practitioners) who all work together to provide you with the care you need, when you need it. You will need a follow up appointment in 12 months.  Please call our office 2 months in advance to schedule this appointment.  You may see DR  [facility] or one of the following Advanced Practice Providers on your designated Care Team:   Kerin Ransom, PA-C Roby Lofts, [place] . Sande Rives, PA-C  Echocardiogram An echocardiogram, or echocardiography, uses sound waves (ultrasound) to produce an image of your heart. The echocardiogram is simple, painless, obtained within a short period of time, and offers valuable information to your health care provider. The images from an echocardiogram can provide information such as:  Evidence of coronary artery disease (CAD).  Heart size.  Heart muscle function.  Heart valve function.  Aneurysm detection.  Evidence of a past heart attack.  Fluid buildup around the heart.  Heart muscle thickening.  Assess heart valve function.  Tell a health care provider about:  Any allergies you have.  All medicines you are taking, including vitamins, herbs, eye drops, creams, and over-the-counter medicines.  Any problems you or family members have had with anesthetic medicines.  Any blood disorders you have.  Any surgeries you have had.  Any medical conditions you have.  Whether you are pregnant or may be pregnant. What happens before the procedure? No special preparation is needed. Eat and drink normally. What happens during the procedure?  In order to produce an image of your heart, gel will be applied to your chest and a wand-like tool (transducer) will be moved over your chest. The gel will help transmit the sound waves from the transducer. The sound waves will harmlessly bounce off your heart to allow the heart images to be captured in real-time motion. These images will then be recorded.  You may need an IV to receive a medicine that improves the quality of the pictures. What happens after the procedure? You may return to your normal schedule including diet, activities, and medicines, unless your health care provider tells you otherwise. This information is not intended to  replace advice given to you by your health care provider. Make sure you discuss any questions you have with your health care provider. Document Released: 03/14/2000 Document Revised: 11/03/2015 Document Reviewed: 11/22/2012 Elsevier Interactive Patient Education  2017 [employer].

## 2018-01-20 LAB — COMPREHENSIVE METABOLIC PANEL
ALK PHOS: 50 IU/L (ref 39–117)
ALT: 16 IU/L (ref 0–44)
AST: 18 IU/L (ref 0–40)
Albumin/Globulin Ratio: 1.3 (ref 1.2–2.2)
Albumin: 3.9 g/dL (ref 3.6–4.8)
BUN/Creatinine Ratio: 15 (ref 10–24)
BUN: 13 mg/dL (ref 8–27)
Bilirubin Total: 0.4 mg/dL (ref 0.0–1.2)
CO2: 24 mmol/L (ref 20–29)
CREATININE: 0.84 mg/dL (ref 0.76–1.27)
Calcium: 9.2 mg/dL (ref 8.6–10.2)
Chloride: 104 mmol/L (ref 96–106)
GFR calc Af Amer: 104 mL/min/{1.73_m2} (ref >59)
GFR calc non Af Amer: 90 mL/min/{1.73_m2} (ref >59)
GLOBULIN, TOTAL: 3.1 g/dL (ref 1.5–4.5)
GLUCOSE: 89 mg/dL (ref 65–99)
POTASSIUM: 4.1 mmol/L (ref 3.5–5.2)
SODIUM: 142 mmol/L (ref 134–144)
Total Protein: 7 g/dL (ref 6.0–8.5)

## 2018-01-20 LAB — LIPID PANEL
CHOLESTEROL TOTAL: 190 mg/dL (ref 100–199)
Chol/HDL Ratio: 3.7 ratio (ref 0.0–5.0)
HDL: 52 mg/dL (ref >39)
LDL Calculated: 122 mg/dL — ABNORMAL HIGH (ref 0–99)
Triglycerides: 81 mg/dL (ref 0–149)
VLDL CHOLESTEROL CAL: 16 mg/dL (ref 5–40)

## 2018-01-25 ENCOUNTER — Telehealth: Payer: Self-pay | Admitting: Cardiovascular Disease

## 2018-01-25 DIAGNOSIS — I2510 Atherosclerotic heart disease of native coronary artery without angina pectoris: Secondary | ICD-10-CM

## 2018-01-25 DIAGNOSIS — E785 Hyperlipidemia, unspecified: Secondary | ICD-10-CM

## 2018-01-25 MED ORDER — ROSUVASTATIN CALCIUM 20 MG PO TABS: 20.0000 mg | ORAL_TABLET | Freq: Every day | ORAL | 6 refills | Status: DC

## 2018-01-25 NOTE — Telephone Encounter (Signed)
The patient has been notified of the result and verbalized understanding.  All questions (if any) were answered.   PRESCRIPTION SENT TO PHARMACY , AND LAB SLIP MAILED TO PATIENT Raiford Simmonds, RN 01/25/2018 4:06 PM

## 2018-01-25 NOTE — Telephone Encounter (Signed)
Follow Up  Pt returning call for nurse about labs

## 2018-01-26 ENCOUNTER — Other Ambulatory Visit: Payer: Self-pay

## 2018-01-26 ENCOUNTER — Ambulatory Visit (HOSPITAL_COMMUNITY)
Admission: RE | Admit: 2018-01-26 | Discharge: 2018-01-26 | Disposition: A | Discharge status: Home / Self Care | Payer: Medicare Other | Source: Ambulatory Visit | Attending: Cardiology | Admitting: Cardiology

## 2018-01-26 ENCOUNTER — Ambulatory Visit (HOSPITAL_BASED_OUTPATIENT_CLINIC_OR_DEPARTMENT_OTHER): Payer: Medicare Other

## 2018-01-26 DIAGNOSIS — I6523 Occlusion and stenosis of bilateral carotid arteries: Secondary | ICD-10-CM | POA: Insufficient documentation

## 2018-01-26 DIAGNOSIS — I7121 Aneurysm of the ascending aorta, without rupture: Secondary | ICD-10-CM

## 2018-01-26 DIAGNOSIS — I712 Thoracic aortic aneurysm, without rupture: Secondary | ICD-10-CM | POA: Diagnosis not present

## 2018-02-23 ENCOUNTER — Telehealth: Payer: Self-pay | Admitting: Cardiovascular Disease

## 2018-02-23 DIAGNOSIS — R079 Chest pain, unspecified: Secondary | ICD-10-CM

## 2018-02-23 DIAGNOSIS — E785 Hyperlipidemia, unspecified: Secondary | ICD-10-CM

## 2018-02-23 NOTE — Telephone Encounter (Signed)
Chest pain at rest is very unlikely to be coming from his heart.  I don't have a problem ordering an ETT if he wants to better assess it, but I suspect it will be normal.

## 2018-02-23 NOTE — Telephone Encounter (Signed)
New message    Pt is calling asking for a call back. He said he wants to talk about his echo.

## 2018-02-23 NOTE — Telephone Encounter (Signed)
Advise patient and placed order for ETT Message sent to scheduling to call and arrange.

## 2018-02-23 NOTE — Telephone Encounter (Signed)
Discussed Echo with patient.  He did state that he has left sided chest pains at times. They come on for no reason without exertion, sometimes just driving down the road. They are not brought on or  relieved by anything. Episodes only last for a few minutes before they subside. Reassured patient but advised would forward to Dr Oval Linsey for review

## 2018-03-04 ENCOUNTER — Telehealth (HOSPITAL_COMMUNITY): Payer: Self-pay

## 2018-03-04 NOTE — Telephone Encounter (Signed)
Encounter complete. 

## 2018-03-09 ENCOUNTER — Ambulatory Visit (HOSPITAL_COMMUNITY)
Admission: RE | Admit: 2018-03-09 | Discharge: 2018-03-09 | Disposition: A | Discharge status: Home / Self Care | Payer: Medicare Other | Source: Ambulatory Visit | Attending: Cardiovascular Disease | Admitting: Cardiovascular Disease

## 2018-03-09 DIAGNOSIS — E785 Hyperlipidemia, unspecified: Secondary | ICD-10-CM

## 2018-03-09 DIAGNOSIS — R079 Chest pain, unspecified: Secondary | ICD-10-CM | POA: Insufficient documentation

## 2018-03-09 LAB — EXERCISE TOLERANCE TEST
Estimated workload: 7 METS
Exercise duration (min): 5 min
Exercise duration (sec): 13 s
MPHR: 152 {beats}/min
Peak HR: 131 {beats}/min
Percent HR: 86 %
RPE: 18
Rest HR: 74 {beats}/min

## 2018-03-26 ENCOUNTER — Ambulatory Visit (INDEPENDENT_AMBULATORY_CARE_PROVIDER_SITE_OTHER): Payer: Medicare Other

## 2018-03-26 DIAGNOSIS — Z23 Encounter for immunization: Secondary | ICD-10-CM | POA: Diagnosis not present

## 2018-04-06 DIAGNOSIS — Z87891 Personal history of nicotine dependence: Secondary | ICD-10-CM | POA: Diagnosis not present

## 2018-04-06 DIAGNOSIS — Z08 Encounter for follow-up examination after completed treatment for malignant neoplasm: Secondary | ICD-10-CM | POA: Diagnosis not present

## 2018-04-06 DIAGNOSIS — Z8546 Personal history of malignant neoplasm of prostate: Secondary | ICD-10-CM | POA: Diagnosis not present

## 2018-05-20 ENCOUNTER — Ambulatory Visit (INDEPENDENT_AMBULATORY_CARE_PROVIDER_SITE_OTHER): Payer: Medicare Other | Admitting: Internal Medicine

## 2018-05-20 ENCOUNTER — Encounter: Payer: Self-pay | Admitting: Internal Medicine

## 2018-05-20 VITALS — BP 118/66 | HR 67 | Ht 74.0 in | Wt 271.8 lb

## 2018-05-20 DIAGNOSIS — Z8669 Personal history of other diseases of the nervous system and sense organs: Secondary | ICD-10-CM | POA: Insufficient documentation

## 2018-05-20 DIAGNOSIS — R0782 Intercostal pain: Secondary | ICD-10-CM

## 2018-05-20 DIAGNOSIS — R0602 Shortness of breath: Secondary | ICD-10-CM | POA: Diagnosis not present

## 2018-05-20 DIAGNOSIS — J479 Bronchiectasis, uncomplicated: Secondary | ICD-10-CM | POA: Diagnosis not present

## 2018-05-20 NOTE — Patient Instructions (Addendum)
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
## 2018-06-08 ENCOUNTER — Ambulatory Visit (INDEPENDENT_AMBULATORY_CARE_PROVIDER_SITE_OTHER)
Admission: RE | Admit: 2018-06-08 | Discharge: 2018-06-08 | Disposition: A | Discharge status: Home / Self Care | Payer: Medicare Other | Source: Ambulatory Visit | Attending: Internal Medicine | Admitting: Internal Medicine

## 2018-06-08 DIAGNOSIS — J479 Bronchiectasis, uncomplicated: Secondary | ICD-10-CM

## 2018-06-09 ENCOUNTER — Ambulatory Visit (INDEPENDENT_AMBULATORY_CARE_PROVIDER_SITE_OTHER): Payer: Medicare Other | Admitting: Pulmonary Disease

## 2018-06-09 ENCOUNTER — Encounter: Payer: Self-pay | Admitting: Pulmonary Disease

## 2018-06-09 ENCOUNTER — Other Ambulatory Visit: Payer: Self-pay

## 2018-06-09 VITALS — BP 118/68 | HR 78 | Ht 74.0 in | Wt 269.0 lb

## 2018-06-09 DIAGNOSIS — G4733 Obstructive sleep apnea (adult) (pediatric): Secondary | ICD-10-CM

## 2018-06-09 DIAGNOSIS — J9809 Other diseases of bronchus, not elsewhere classified: Secondary | ICD-10-CM

## 2018-06-09 DIAGNOSIS — IMO0002 Reserved for concepts with insufficient information to code with codable children: Secondary | ICD-10-CM

## 2018-06-09 DIAGNOSIS — J479 Bronchiectasis, uncomplicated: Secondary | ICD-10-CM | POA: Diagnosis not present

## 2018-06-09 DIAGNOSIS — G4736 Sleep related hypoventilation in conditions classified elsewhere: Secondary | ICD-10-CM

## 2018-06-09 NOTE — Patient Instructions (Addendum)
Your CT chest showed stable changes from bronchiectasis.  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  This can cause hypoventilation, or shallow breathing, while you are a sleep.  Will arrange for in lab CPAP titration study.  Will determine if you need change to your CPAP settings, or need to change to a Bipap machine.  Follow up in 3 to 4 months

## 2018-06-09 NOTE — Progress Notes (Signed)
[facility]  Chief Complaint  Patient presents with  . Follow-up    Pt doing well overall with cpap machine, pt is in need of new machine his is over 55yr old.    Constitutional:  BP 118/68 (BP Location: Left Arm, Cuff Size: Normal)   Pulse 78   Ht 6' 2" (1.88 m)   Wt 269 lb (122 kg)   SpO2 97%   BMI 34.54 kg/m   Past Medical History:  Prostate cancer, Hypothyroidism, Pulmonary infiltrates 2013 s/p VATs lung bx  Brief Summary:  LDora [name] a 68y.o. male former smoker with obstructive sleep apnea and bronchiectasis.  I last saw him in 2016.  He has more recently been followed by Dr. RChase Caller  He uses CPAP nightly.  Gets supplies from [employer]  Uses nasal pillows.  Gets sinus congestion.  Machine is old.  He is not having cough, chest congestion, sputum, or hemoptysis.  No fever.  Had CT chest yesterday (reviewed by me).  Has new tracheobronchomalacia.   Physical Exam:   Appearance - well kempt   ENMT - clear nasal mucosa, midline nasal  septum, no oral exudates, no LAN, trachea midline  Respiratory - normal chest wall, normal respiratory effort, no accessory muscle use, no wheeze/rales  CV - s1s2 regular rate and rhythm, no murmurs, no peripheral edema, radial pulses symmetric  GI - soft, non tender, no masses  Lymph - no adenopathy noted in neck and axillary areas  MSK - normal gait  Ext - no cyanosis, clubbing, or joint inflammation noted  Skin - no rashes, lesions, or ulcers  Neuro - normal strength, oriented x 3  Psych - normal mood and affect  Discussion:    Assessment/Plan:   Obstructive sleep apnea. - he reports compliance with therapy - will need in lab titration study  Tracheobronchomalacia. - concerned he could have some degree of hypoventilation - will f/u in lab titration study to determine if he should continue CPAP, or change to Bipap +/- oxygen  Bronchiectasis. - f/u with Dr.  RChase Caller Patient Instructions  Your CT chest showed stable changes from bronchiectasis.  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  This can cause hypoventilation, or shallow breathing, while you are a sleep.  Will arrange for in lab CPAP titration study.  Will determine if you need change to your CPAP settings, or need to change to a Bipap machine.  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  Average AHI is 0.9 with median CPAP 6 cm H2O and 95 th percentile CPAP 9 cm H20.  Medications:   Allergies as of 06/09/2018      Reactions   Ivp Dye [iodinated Diagnostic Agents] Anaphylaxis  Coma for a day in '09   Lactose Intolerance (gi) Other (See Comments)      Medication List       Accurate as of June 09, 2018 12:35 PM. Always use your most recent med list.        Advil 200 MG Caps Generic drug:  Ibuprofen Take 600 mg by mouth as needed.   rosuvastatin 20 MG tablet Commonly known as:  CRESTOR Take 20 mg by mouth daily.       Past Surgical History:  He  has a past surgical history that includes Mediastinoscopy (07-12-2007); Nasal sinus surgery; prostate biopsy (06/11/12); Video assisted thoracoscopy (vats)/ lymph node sampling (Left, 01-05-2012); Cardiovascular  stress test (01-10-2008); transthoracic echocardiogram (12-31-2011); Radioactive seed implant (N/A, 12/23/2012); Colonoscopy (01-15-2009); Lung biopsy; and Polypectomy.  Family History:  His family history includes Alzheimer's disease in his maternal grandmother and mother; Bradycardia in his father; Cancer in his paternal uncle; Colon cancer in his father; Diabetes in his daughter; Heart attack in his father; Hyperlipidemia in his sister; Hypertension in his mother; Nephrolithiasis in his father; Prostate cancer in his father; Sinusitis in his brother.  Social History:  He  reports that he quit smoking about 32 years ago. His smoking use included cigarettes and cigars. He has a 2.00 pack-year smoking history. He has never used smokeless tobacco. He reports current alcohol use of about 2.0 standard drinks of alcohol per week. He reports that he does not use drugs.      

## 2018-06-21 NOTE — Telephone Encounter (Signed)
Pt saw report of CT scan results on his Mychart. Per Mychart, the results read as shown below:  ----- Message -----  From: Lolly Mustache  Sent: 3/23/20209:01 AM EDT  To: Brand Males, MD Subject: Non-Urgent Medical Question  Under impression on my recent CT Scan report I see: Two vessel coronary atherosclerosis. Aortic atherosclerosis. Is this something serious or something I need  to be concerned about?Please explain. My number is [phone] if you need to call me.  Thanks!  Pt is wanting a further explanation of results. MR, please advise on this for pt. Thanks!

## 2018-06-22 NOTE — Telephone Encounter (Signed)
I did not comment on it in release of results because his stress test with Dr Oval Linsey was negative. So, is a sign of aeging

## 2018-07-28 ENCOUNTER — Encounter (HOSPITAL_BASED_OUTPATIENT_CLINIC_OR_DEPARTMENT_OTHER): Payer: Medicare Other

## 2018-09-03 ENCOUNTER — Other Ambulatory Visit (HOSPITAL_COMMUNITY): Payer: Medicare Other

## 2018-09-07 ENCOUNTER — Encounter (HOSPITAL_BASED_OUTPATIENT_CLINIC_OR_DEPARTMENT_OTHER): Payer: Medicare Other

## 2018-09-13 ENCOUNTER — Ambulatory Visit: Payer: Medicare Other | Admitting: Pulmonary Disease

## 2018-09-13 DIAGNOSIS — E785 Hyperlipidemia, unspecified: Secondary | ICD-10-CM | POA: Diagnosis not present

## 2018-09-13 DIAGNOSIS — I2510 Atherosclerotic heart disease of native coronary artery without angina pectoris: Secondary | ICD-10-CM | POA: Diagnosis not present

## 2018-09-13 LAB — COMPREHENSIVE METABOLIC PANEL
A/G RATIO: 1.4 (ref 1.2–2.2)
ALBUMIN: 4.1 g/dL (ref 3.8–4.8)
ALT: 22 IU/L (ref 0–44)
AST: 22 IU/L (ref 0–40)
Alkaline Phosphatase: 53 IU/L (ref 39–117)
BUN/Creatinine Ratio: 18 (ref 10–24)
BUN: 12 mg/dL (ref 8–27)
Bilirubin Total: 0.4 mg/dL (ref 0.0–1.2)
CO2: 22 mmol/L (ref 20–29)
CREATININE: 0.68 mg/dL — AB (ref 0.76–1.27)
Calcium: 9.3 mg/dL (ref 8.6–10.2)
Chloride: 103 mmol/L (ref 96–106)
GFR calc Af Amer: 113 mL/min/{1.73_m2} (ref >59)
GFR calc non Af Amer: 97 mL/min/{1.73_m2} (ref >59)
Globulin, Total: 3 g/dL (ref 1.5–4.5)
Glucose: 94 mg/dL (ref 65–99)
Potassium: 4.2 mmol/L (ref 3.5–5.2)
SODIUM: 140 mmol/L (ref 134–144)
TOTAL PROTEIN: 7.1 g/dL (ref 6.0–8.5)

## 2018-09-13 LAB — LIPID PANEL
CHOL/HDL RATIO: 2.1 ratio (ref 0.0–5.0)
CHOLESTEROL TOTAL: 131 mg/dL (ref 100–199)
HDL: 62 mg/dL (ref >39)
LDL CALC: 57 mg/dL (ref 0–99)
Triglycerides: 62 mg/dL (ref 0–149)
VLDL Cholesterol Cal: 12 mg/dL (ref 5–40)

## 2018-09-27 ENCOUNTER — Other Ambulatory Visit (HOSPITAL_COMMUNITY)
Admission: RE | Admit: 2018-09-27 | Discharge: 2018-09-27 | Disposition: A | Discharge status: Home / Self Care | Payer: Medicare Other | Source: Ambulatory Visit | Attending: Pulmonary Disease | Admitting: Pulmonary Disease

## 2018-09-27 ENCOUNTER — Other Ambulatory Visit: Payer: Self-pay

## 2018-09-27 DIAGNOSIS — Z1159 Encounter for screening for other viral diseases: Secondary | ICD-10-CM | POA: Insufficient documentation

## 2018-09-27 DIAGNOSIS — Z01812 Encounter for preprocedural laboratory examination: Secondary | ICD-10-CM | POA: Diagnosis not present

## 2018-09-27 LAB — SARS CORONAVIRUS 2 (TAT 6-24 HRS): SARS Coronavirus 2: NEGATIVE

## 2018-09-29 ENCOUNTER — Ambulatory Visit (HOSPITAL_BASED_OUTPATIENT_CLINIC_OR_DEPARTMENT_OTHER): Payer: Medicare Other | Attending: Pulmonary Disease | Admitting: Pulmonary Disease

## 2018-09-29 ENCOUNTER — Other Ambulatory Visit: Payer: Self-pay

## 2018-09-29 VITALS — Ht 74.0 in | Wt 266.0 lb

## 2018-09-29 DIAGNOSIS — G4736 Sleep related hypoventilation in conditions classified elsewhere: Secondary | ICD-10-CM | POA: Insufficient documentation

## 2018-09-29 DIAGNOSIS — G4733 Obstructive sleep apnea (adult) (pediatric): Secondary | ICD-10-CM | POA: Diagnosis not present

## 2018-09-29 DIAGNOSIS — J9809 Other diseases of bronchus, not elsewhere classified: Secondary | ICD-10-CM | POA: Diagnosis not present

## 2018-09-30 ENCOUNTER — Other Ambulatory Visit (HOSPITAL_BASED_OUTPATIENT_CLINIC_OR_DEPARTMENT_OTHER): Payer: Self-pay

## 2018-09-30 ENCOUNTER — Telehealth: Payer: Self-pay | Admitting: Pulmonary Disease

## 2018-09-30 DIAGNOSIS — IMO0002 Reserved for concepts with insufficient information to code with codable children: Secondary | ICD-10-CM

## 2018-09-30 DIAGNOSIS — G4733 Obstructive sleep apnea (adult) (pediatric): Secondary | ICD-10-CM

## 2018-09-30 DIAGNOSIS — J9809 Other diseases of bronchus, not elsewhere classified: Secondary | ICD-10-CM

## 2018-09-30 DIAGNOSIS — G4736 Sleep related hypoventilation in conditions classified elsewhere: Secondary | ICD-10-CM

## 2018-09-30 NOTE — Telephone Encounter (Signed)
Called and spoke with pt letting him know the results of the cpap titration study and stated to him that VS said he did well during study on 8cm.  Stated to pt I would send order to DME for new CPAP on the pressures stated by VS and pt verbalized understanding. Pt wanted to follow up with VS for the 2 month follow up appt but VS does not have a current schedule. Recall has been placed in pt's chart in regards to the 2 month follow up. Nothing further needed.

## 2018-09-30 NOTE — Procedures (Signed)
    Patient Name: Chad [name], Chad [name] Date: 09/29/2018 Gender: Male D.O.B: 06-Oct-1950 Age (years): 28 Referring Provider: Chesley Mires MD, ABSM Height (inches): 12 Interpreting Physician: Chesley Mires MD, ABSM Weight (lbs): 266 RPSGT: Laren Everts BMI: 76 MRN: [id number] Neck Size: 17.00  CLINICAL INFORMATION The patient is a 68 year old with history of bronchiectasis, tracheobronchomalacia, and obstructive sleep apnea.  He presents for a CPAP titration study.  SLEEP STUDY TECHNIQUE As per the AASM Manual for the Scoring of Sleep and Associated Events v2.3 (April 2016) with a hypopnea requiring 4% desaturations.  The channels recorded and monitored were frontal, central and occipital EEG, electrooculogram (EOG), submentalis EMG (chin), nasal and oral airflow, thoracic and abdominal wall motion, anterior tibialis EMG, snore microphone, electrocardiogram, and pulse oximetry. Continuous positive airway pressure (CPAP) was initiated at the beginning of the study and titrated to treat sleep-disordered breathing.  MEDICATIONS Medications self-administered by patient taken the night of the study : N/A  TECHNICIAN COMMENTS Comments added by technician: Patient was restless all through the night. Comments added by scorer: N/A  RESPIRATORY PARAMETERS Optimal PAP Pressure (cm): 8 AHI at Optimal Pressure (/hr): 0.0 Overall Minimal O2 (%): 86.0 Supine % at Optimal Pressure (%): 100 Minimal O2 at Optimal Pressure (%): 93.0   SLEEP ARCHITECTURE The study was initiated at 10:12:09 PM and ended at 5:19:26 AM.  Sleep onset time was 25.0 minutes and the sleep efficiency was 66.0%%. The total sleep time was 282 minutes.  The patient spent 12.1%% of the night in stage N1 sleep, 67.2%% in stage N2 sleep, 0.0%% in stage N3 and 20.7% in REM.Stage REM latency was 117.5 minutes  Wake after sleep onset was 120.3. Alpha intrusion was absent. Supine sleep was 75.00%.  CARDIAC DATA The 2 lead  EKG demonstrated sinus rhythm. The mean heart rate was 67.6 beats per minute. Other EKG findings include: None.  LEG MOVEMENT DATA The total Periodic Limb Movements of Sleep (PLMS) were 0. The PLMS index was 0.0. A PLMS index of <15 is considered normal in adults.  IMPRESSIONS - He did well with CPAP pressure was 8 cm of water.  He was observed in REM and supine sleep. - He did not require supplemental oxygen during this study.  DIAGNOSIS - Obstructive Sleep Apnea (327.23 [G47.33 ICD-10])  RECOMMENDATIONS - Trial of CPAP therapy on 8 cm H2O with a Medium size Fisher&Paykel Nasal Pillow Mask Brevida mask and heated humidification.  [Electronically signed] 09/30/2018 08:22 AM  Chesley Mires MD, ABSM Diplomate, American Board of Sleep Medicine   NPI: [id number]

## 2018-09-30 NOTE — Telephone Encounter (Signed)
CPAP 09/29/18 >> CPAP 8 cm H2O >> AHI 0, +R, +S.   Please let him know that he did well during sleep study with CPAP 8 cm H2O.  Please arrange for new CPAP machine at pressure 8 cm H2O with heated humidity.  He needs ROV in 2 months after getting new CPAP.

## 2018-10-27 ENCOUNTER — Telehealth: Payer: Self-pay | Admitting: Pulmonary Disease

## 2018-10-27 DIAGNOSIS — G4733 Obstructive sleep apnea (adult) (pediatric): Secondary | ICD-10-CM

## 2018-10-27 NOTE — Telephone Encounter (Signed)
Message sent to [place] to see if they have processed.

## 2018-10-27 NOTE — Telephone Encounter (Signed)
Pt is calling inquiring on the status of his CPAP machine. Order for CPAP was placed 09/30/2018 with [facility] and, according to referral notes, "printed" by Estill Bamberg.   PCCs, can you help with this? Please advise. Thank you.

## 2018-10-29 NOTE — Telephone Encounter (Signed)
Has there been any update on this issue with the CPAP?   [facility] please advise and close message if nothing further needed

## 2018-10-29 NOTE — Telephone Encounter (Signed)
Cletis [place] sent to Nadine Counts, Gilda        ORDER# [id number] FOR NEW CPAP CANCELLED PT NOT ELIGIBLE FOR NEW MACHINE UNTIL AUG 20  21, WAITING ON PATIENT TO RETURN CALL FOR MASK ISSUES

## 2018-10-29 NOTE — Telephone Encounter (Signed)
Reached out to [place] & awaiting response.

## 2018-10-29 NOTE — Telephone Encounter (Signed)
Holly I spoke with pt and he stated that he didn't know if his settings on his CPAP had been changed to 8cm as instructed by VS. I advised him that he could not get another CPAP until 10/2019. I called [facility] and they stated we needed another order for the setting changer. I placed order and nothing further is needed. [facility].

## 2018-11-19 ENCOUNTER — Telehealth: Payer: Self-pay | Admitting: Cardiovascular Disease

## 2018-11-19 NOTE — Telephone Encounter (Signed)
Per wife patient had 2 episodes of feeling light headed and dizziness on yesterday.  1st time while walking and after he took a shower later that evening.

## 2018-11-22 NOTE — Telephone Encounter (Signed)
Spoke with patients wife and patient had 2 dizzy spells yesterday and one today. She has not checked his blood pressure. She asked patient about shortness of breath, chest pain, or swelling he denied any of these. Advised Dr Oval Linsey had nothing sooner than current appointment and offered appointment with APP. Wife will just monitor and call back if continues

## 2019-01-05 NOTE — Progress Notes (Signed)
Cardiology Office Note   Date:  01/11/2019   ID:  Chad [name], DOB 1950-08-31, MRN [id number]  PCP:  Dorothyann Peng, NP  Cardiologist:   Skeet Latch, MD   No chief complaint on file.    History of Present Illness: Chad [name] is a 68 y.o. male with chronic diastolic heart failure, asymptomatic coronary calcifications, interstitial lung disease/pulmonary fibrosis, OSA on CPAP, hyperlipidemia, and diabetes who presents for follow-up.  He initially saw Dr. Curt Bears 10/2015.  He was referred because of a CT scan that showed coronary calcifications and calcified aortic valve.  At the time he was completely asymptomatic.  Fasting lipids revealed an LDL of 126.  He had an echo 12/2011 that revealed LVEF 55 to 60%, and a mildly dilated ascending aorta.  Carotid Dopplers 11/2013 revealed 1-39% stenosis bilaterally.  He had a CPX 02/2016 that revealed mild functional impairment in gas exchange and mild restrictive physiology.  It was felt that his imitations at peak exercise were mostly attributed to body habitus.  He had a repeat echo 12/2017 that showed LVEF 60-65% with grade 2 diastolic dysfunction and no aortic aneurysm.  Since his last appointment Chad [name] has been doing well.  He has not been getting much formal exercise.  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  He has no exertional chest pain or shortness of breath.  He occasionally has dizziness that typically occurs with turning his head in certain positions.  He denies orthostatic symptoms.  He has not noted any lower extremity edema, orthopnea, or PND.   Past Medical History:  Diagnosis Date  . Arthritis   . ASTHMA 02/11/2007       . CARBUNCLE AND FURUNCLE OF LEG EXCEPT FOOT 08/14/2009   Qualifier: Diagnosis of  By: Wynona Luna   . Carotid stenosis, asymptomatic 01/19/2018   1-39% on carotid Doppler 2015.   Marland Kitchen CARPAL TUNNEL SYNDROME, LEFT 02/15/2009   Qualifier:  Diagnosis of  By: Wynona Luna   . Chronic interstitial lung disease ([place])    PULMONARY INFILTRATE  --  PULMOLOGIST-  DR CLANCE  . Chronic steroid use    INTERSTITIAL LUNG DISEASE  . COLONIC POLYPS, HX OF 12/23/2006   Qualifier: Diagnosis of  By: Marca Ancona RMA, Lucy    . Complication of anesthesia    EMERGENCE COMBATIVENESS---  PLEASE REFER TO VATS 01-05-1012 PROCEDURE ,  DR Linna Caprice DOCUMENTED GRADE IV DIFFICULT VISUAL AIRWAY  . DIABETES MELLITUS, TYPE II, BORDERLINE 11/14/2008   Qualifier: Diagnosis of  By: Wynona Luna   . Dyspnea on exertion   . Elevated PSA 08/13/2011  . GERD 12/23/2006   Qualifier: Diagnosis of  By: Marca Ancona RMA, Lucy    . History of anal fissures   . History of hypothyroidism   . Hyperlipidemia 05/01/2016  . Hypothyroidism 12/23/2006   Qualifier: Diagnosis of  By: Marca Ancona RMA, Lucy    . MEDIASTINAL LYMPHADENOPATHY 08/30/2007   Qualifier: Diagnosis of  By: Joya Gaskins MD, Burnett Harry   . Mild ascending aorta dilatation ([place]) 01/19/2018  . Oral thrush 04/22/2012  . OSA (obstructive sleep apnea) 12/23/2006   Pt prefers to stay on auto setting.    . Pneumonia 11/13/2011  . Prostate cancer ([place]) 06/09/12 bx   Adenocarcinoma  . PULMONARY EDEMA 07/05/2007   Qualifier: Diagnosis of  By: Joya Gaskins MD, Burnett Harry   . Pulmonary infiltrate 12/23/2011   H/o LN, pericardial effusion,  pleural effusion unknown origin 2009.  ?resolved with prednisone?? Mediastinoscopy 2009:  Benign lymphoid hyperplasia Ct chest 11/2011: scattered GGO, interstitial changes, small pulmonary nodules, mild LN No response to prolonged course of abx. Autoimmune w/u:  ESR 78, but ACE/RF/DSDNA/CCP/ENA/SCL70/HP panel/ANCA all neg.  ANA low positive (prob neg). VATS bx 12/2011:  CIP with organizing pna.  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  V/Q 01/2012:  Ok +++ response to steroids (started 12/2011) PFT's 12/2012:  No obstruction, TLC 3.25 (42%), DLCO 44% Pulmonary rehab 2014 xrays and symptoms greatly improved with steroids, and  no recurrence off steroids   . RHINOSINUSITIS, ACUTE 01/29/2010   Qualifier: Diagnosis of  By: Wynona Luna   . SHOULDER PAIN, LEFT 11/14/2008   Qualifier: Diagnosis of  By: Wynona Luna   . Sinusitis 11/26/2010  . Sleep apnea    on cpap  . Thrombocytopenia ([place])     Past Surgical History:  Procedure Laterality Date  . CARDIOVASCULAR STRESS TEST  01-10-2008   NORMAL EXERCISE STRESS TEST AT GOOD WORKLOAD  . COLONOSCOPY  01-15-2009   polyps and tics  . LUNG BIOPSY    . MEDIASTINOSCOPY  07-12-2007   BILATERAL PLEURAL EFFUSIONS  . NASAL SINUS SURGERY    . POLYPECTOMY    . prostate biopsy  06/11/12   Adenocarcinoma  . RADIOACTIVE SEED IMPLANT N/A 12/23/2012   Procedure: RADIOACTIVE SEED IMPLANT;  Surgeon: Franchot Gallo, MD;  Location: [facility];  Service: Urology;  Laterality: N/A;   81   seeds implanted   . TRANSTHORACIC ECHOCARDIOGRAM  12-31-2011   NORMAL LVF/   EF  55-60%  . VIDEO ASSISTED THORACOSCOPY (VATS)/ LYMPH NODE SAMPLING Left 01-05-2012   LUNG AND LYMPH NODE BX'S (CHRONIC INTERSTITIAL PNEUMONIA)     Current Outpatient Medications  Medication Sig Dispense Refill  . Ibuprofen (ADVIL) 200 MG CAPS Take 600 mg by mouth as needed.     . rosuvastatin (CRESTOR) 20 MG tablet Take 20 mg by mouth daily.     No current facility-administered medications for this visit.     Allergies:   Ivp dye [iodinated diagnostic agents] and Lactose intolerance (gi)    Social History:  The patient  reports that he quit smoking about 32 years ago. His smoking use included cigarettes and cigars. He has a 2.00 pack-year smoking history. He has never used smokeless tobacco. He reports current alcohol use of about 2.0 standard drinks of alcohol per week. He reports that he does not use drugs.   Family History:  The patient's family history includes Alzheimer's disease in his maternal grandmother and mother; Bradycardia in his father; Cancer in his paternal uncle; Colon  cancer in his father; Diabetes in his daughter; Heart attack in his father; Hyperlipidemia in his sister; Hypertension in his mother; Nephrolithiasis in his father; Prostate cancer in his father; Sinusitis in his brother.    ROS:  Please see the history of present illness.   Otherwise, review of systems are positive for none.   All other systems are reviewed and negative.    VS:  BP 112/80   Pulse 74   Ht _0  (1.88 m)   Wt 269 lb (122 kg)   SpO2 97%   BMI 34.54 kg/m  , BMI Body mass index is 34.54 kg/m. GENERAL:  Well appearing HEENT: Pupils equal round and reactive, fundi not visualized, oral mucosa unremarkable NECK:  No jugular venous distention, waveform within normal limits, carotid upstroke brisk and symmetric,  no bruits LUNGS:  Clear to auscultation bilaterally HEART:  RRR.  PMI not displaced or sustained,S1 and S2 within normal limits, no S3, no S4, no clicks, no rubs, no murmurs ABD:  Flat, positive bowel sounds normal in frequency in pitch, no bruits, no rebound, no guarding, no midline pulsatile mass, no hepatomegaly, no splenomegaly EXT:  2 plus pulses throughout, trace edema, no cyanosis no clubbing SKIN:  No rashes no nodules NEURO:  Cranial nerves II through XII grossly intact, motor grossly intact throughout PSYCH:  Cognitively intact, oriented to person place and time   EKG:  EKG is ordered today. The ekg ordered today demonstrates sinus rhythm.  Rate 78 bpm.  RSR' in V1.  LVH. 01/11/2019: Sinus rhythm.  Rate 74 bpm.  Left axis deviation.  Echo 01/26/18: Study Conclusions  - Left ventricle: The cavity size was normal. Wall thickness was   normal. Systolic function was normal. The estimated ejection   fraction was in the range of 60% to 65%. Wall motion was normal;   there were no regional wall motion abnormalities. Features are   consistent with a pseudonormal left ventricular filling pattern,   with concomitant abnormal relaxation and increased filling    pressure (grade 2 diastolic dysfunction). - Aortic valve: Mildly calcified annulus. There was mild   regurgitation.   Recent Labs: 09/13/2018: ALT 22; BUN 12; Creatinine, Ser 0.68; Potassium 4.2; Sodium 140    Lipid Panel    Component Value Date/Time   CHOL 131 09/13/2018 0929   TRIG 62 09/13/2018 0929   HDL 62 09/13/2018 0929   CHOLHDL 2.1 09/13/2018 0929   CHOLHDL 4 07/09/2017 0957   VLDL 12.2 07/09/2017 0957   LDLCALC 57 09/13/2018 0929      Wt Readings from Last 3 Encounters:  01/11/19 269 lb (122 kg)  09/29/18 266 lb (120.7 kg)  06/09/18 269 lb (122 kg)      ASSESSMENT AND PLAN:  # Asymptomatic coronary calcification:  # Hyperlipidemia: Chad [name] has no symptoms concerning for ischemia.  LDL is well-controlled.  It was 57 on 08/2018.  Continue rosuvastatin.  We also discussed increasing his exercise to 150 minutes/week.  ETT negative for ischemia 02/2018.  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  Not seen on repeat echo 12/2017.  # Mild carotid stenosis:  1-39% 11/2013.  No stenosis noted 12/2017.  # OSA on CPAP: Continue CPAP.  Current medicines are reviewed at length with the patient today.  The patient does not have concerns regarding medicines.  The following changes have been made:  no change  Labs/ tests ordered today include:   Orders Placed This Encounter  Procedures  . EKG 12-Lead     Disposition:   FU with Yasmin Dibello C. Oval Linsey, MD, [facility] in 1 year.      Signed, Viraaj Vorndran C. Oval Linsey, MD, [facility]  01/11/2019 9:25 AM    [place]

## 2019-01-10 DIAGNOSIS — H11153 Pinguecula, bilateral: Secondary | ICD-10-CM | POA: Diagnosis not present

## 2019-01-10 DIAGNOSIS — H04123 Dry eye syndrome of bilateral lacrimal glands: Secondary | ICD-10-CM | POA: Diagnosis not present

## 2019-01-11 ENCOUNTER — Ambulatory Visit (INDEPENDENT_AMBULATORY_CARE_PROVIDER_SITE_OTHER): Payer: Medicare Other | Admitting: Cardiovascular Disease

## 2019-01-11 ENCOUNTER — Other Ambulatory Visit: Payer: Self-pay

## 2019-01-11 ENCOUNTER — Encounter: Payer: Self-pay | Admitting: Cardiovascular Disease

## 2019-01-11 VITALS — BP 112/80 | HR 74 | Ht 74.0 in | Wt 269.0 lb

## 2019-01-11 DIAGNOSIS — I712 Thoracic aortic aneurysm, without rupture: Secondary | ICD-10-CM | POA: Diagnosis not present

## 2019-01-11 DIAGNOSIS — E785 Hyperlipidemia, unspecified: Secondary | ICD-10-CM

## 2019-01-11 DIAGNOSIS — I7121 Aneurysm of the ascending aorta, without rupture: Secondary | ICD-10-CM

## 2019-01-11 NOTE — Patient Instructions (Signed)
Medication Instructions:  Your physician recommends that you continue on your current medications as directed. Please refer to the Current Medication list given to you today.  If you need a refill on your cardiac medications before your next appointment, please call your pharmacy.   Lab work: NONE  Testing/Procedures: NONE   Follow-Up: At [facility], you and your health needs are our priority.  As part of our continuing mission to provide you with exceptional heart care, we have created designated Provider Care Teams.  These Care Teams include your primary Cardiologist (physician) and Advanced Practice Providers (APPs -  Physician Assistants and Nurse Practitioners) who all work together to provide you with the care you need, when you need it. You will need a follow up appointment in 12 months.  Please call our office 2 months in advance to schedule this appointment.  You may see DR [place]  or one of the following Advanced Practice Providers on your designated Care Team:   Luke Kilroy, PA-C Krista Kroeger, PA-C . Callie Goodrich, PA-C    

## 2019-01-12 ENCOUNTER — Other Ambulatory Visit: Payer: Self-pay | Admitting: Cardiovascular Disease

## 2019-01-14 ENCOUNTER — Encounter: Payer: Self-pay | Admitting: *Deleted

## 2019-01-14 NOTE — Telephone Encounter (Signed)
This encounter was created in error - please disregard.  This encounter was created in error - please disregard.

## 2019-01-17 ENCOUNTER — Telehealth: Payer: Self-pay | Admitting: *Deleted

## 2019-01-17 ENCOUNTER — Telehealth: Payer: Self-pay | Admitting: Pulmonary Disease

## 2019-01-17 NOTE — Telephone Encounter (Signed)
Called and spoke with Patient. Patient requested flu vaccine.  Patient scheduled 01/17/19 at 1130. Nothing further at this time.

## 2019-01-17 NOTE — Telephone Encounter (Signed)
Copied from [place] [id number]. Topic: Appointment Scheduling - Scheduling Inquiry for Clinic >> Jan 17, 2019 10:15 AM Gerre Pebbles R wrote: Reason for CRM: Spoke to pt to cancel AWV with health coach due to that position being vacant.  Pt wanted to schedule an exam at the office but did not want to see Dorothyann Peng, NP. Pt states he would like an MD. Please advise

## 2019-01-18 ENCOUNTER — Ambulatory Visit: Payer: Medicare Other

## 2019-01-18 ENCOUNTER — Ambulatory Visit (INDEPENDENT_AMBULATORY_CARE_PROVIDER_SITE_OTHER): Payer: Medicare Other

## 2019-01-18 ENCOUNTER — Other Ambulatory Visit: Payer: Self-pay

## 2019-01-18 DIAGNOSIS — Z23 Encounter for immunization: Secondary | ICD-10-CM | POA: Diagnosis not present

## 2019-01-19 NOTE — Telephone Encounter (Signed)
Please help pt to establish with a new provider that is a MD

## 2019-01-19 NOTE — Telephone Encounter (Signed)
LMVM for the patient to contact the office to schedule an establishment appointment with one of our MD's. The patient does not want an AGNP. He wants to have a MD for his provider.

## 2019-01-19 NOTE — Telephone Encounter (Signed)
I sent a message to [place] for [facility].

## 2019-01-19 NOTE — Telephone Encounter (Signed)
Patient returning call to [facility]. Called office to find out if I could go ahead and schedule or if [facility] Request was needed. Spoke with Joycelyn Schmid and he advised to send the [facility] request CRM since Arbie Cookey was busy. Patient states that he would just like to speak with Arbie Cookey. Please advise.

## 2019-01-25 ENCOUNTER — Other Ambulatory Visit: Payer: Self-pay

## 2019-01-25 ENCOUNTER — Encounter: Payer: Self-pay | Admitting: Pulmonary Disease

## 2019-01-25 ENCOUNTER — Ambulatory Visit (INDEPENDENT_AMBULATORY_CARE_PROVIDER_SITE_OTHER): Payer: Medicare Other | Admitting: Pulmonary Disease

## 2019-01-25 VITALS — BP 124/80 | HR 76 | Temp 97.9°F | Ht 74.0 in | Wt 272.2 lb

## 2019-01-25 DIAGNOSIS — G4733 Obstructive sleep apnea (adult) (pediatric): Secondary | ICD-10-CM

## 2019-01-25 NOTE — Progress Notes (Signed)
[place] [facility]  Chief Complaint  Patient presents with  . Follow-up    OSA    Constitutional:  BP 124/80 (BP Location: Right Arm, Patient Position: Sitting, Cuff Size: Normal)   Pulse 76   Temp 97.9 F (36.6 C)   Ht 6' 2"  (1.88 m)   Wt 272 lb 3.2 oz (123.5 kg)   SpO2 97% Comment: on room air  BMI 34.95 kg/m   Past Medical History:  Prostate cancer, Hypothyroidism, Pulmonary infiltrates 2013 s/p VATs lung bx  Brief Summary:  Chad [name] is a 68 y.o. male former smoker with obstructive sleep apnea and bronchiectasis.  He had CPAP titration in July.  Set up with CPAP 8 cm H2O.  Sleeping well.  No issues with mask fit or pressure setting.  Not having sinus congestion, sore throat, dry mouth, or aerophagia.  Feels rested during the day.   Physical Exam:   Appearance - well kempt   ENMT - no sinus tenderness, no nasal discharge, no oral exudate  Neck - no masses, trachea midline, no thyromegaly, no elevation in JVP  Respiratory - normal appearance of chest wall, normal respiratory effort w/o accessory muscle use, no dullness on percussion, no wheezing or rales  CV - s1s2 regular rate and rhythm, no murmurs, no peripheral edema, radial pulses symmetric  GI - soft, non tender  Lymph - no adenopathy noted in neck and axillary areas  MSK - normal gait  Ext - no cyanosis, clubbing, or joint inflammation noted  Skin - no rashes, lesions, or ulcers  Neuro - normal strength, oriented x 3  Psych - normal mood and affect   Assessment/Plan:   Obstructive sleep apnea. - he is compliant with CPAP and reports benefit from therapy - continue CPAP 8 cm H2O  Tracheobronchomalacia. Bronchiectasis. - f/u with Dr. Chase Caller   Patient Instructions  Follow up in 1 year    Chesley Mires, MD [place] Pager: [phone] 01/25/2019, 11:39 AM  Flow Sheet     Pulmonary tests:  Labs April 2009 >> ESR 115, RF  24, ANA negative Labs September 2013 >> ANCA, SCL 70, HP panel, anti DS DNA, RF, ACE >> negative VATS bx 01/05/12 >> chronic interstitial pneumonia with organizing pneumonia and hemorrhagic infarct >> NSIP versus viral mediated PFT 12/30/12 >> FEV1 2.33 (68%), FEV1% 89, TLC 3.25 (42%), DLCO 44%, no BD  Chest imaging:  HRCT chest 06/08/18 >> atherosclerosis, 4 mm nodule LUL, mild cylindrical BTX b/l lower lobes, mild tracheobronchomalacia  Sleep testing:  Auto CPAP 12/26/14 to 01/24/15 >> used on 30 of 30 nights with average 6 hrs and 19 min.  Average AHI is 0.9 with median CPAP 6 cm H2O and 95 th percentile CPAP 9 cm H20. CPAP 09/29/18 >> CPAP 8 cm H2O >> AHI 0, +R, +S. CPAP 12/25/18 to 01/23/19 >> used on 30 of 30 nights with average 5 hrs 43 min.  Average AHI 0.3 with CPAP 8 cm H2O.  Medications:   Allergies as of 01/25/2019      Reactions   Ivp Dye [iodinated Diagnostic Agents] Anaphylaxis   Coma for a day in '09   Lactose Intolerance (gi) Other (See Comments)      Medication List       Accurate as of January 25, 2019 11:39 AM. If you have any questions, ask your nurse or doctor.        Advil 200 MG Caps Generic drug: Ibuprofen Take 600 mg  by mouth as needed.   rosuvastatin 20 MG tablet Commonly known as: CRESTOR TAKE 1 TABLET BY MOUTH EVERY DAY       Past Surgical History:  He  has a past surgical history that includes Mediastinoscopy (07-12-2007); Nasal sinus surgery; prostate biopsy (06/11/12); Video assisted thoracoscopy (vats)/ lymph node sampling (Left, 01-05-2012); Cardiovascular stress test (01-10-2008); transthoracic echocardiogram (12-31-2011); Radioactive seed implant (N/A, 12/23/2012); Colonoscopy (01-15-2009); Lung biopsy; and Polypectomy.  Family History:  His family history includes Alzheimer's disease in his maternal grandmother and mother; Bradycardia in his father; Cancer in his paternal uncle; Colon cancer in his father; Diabetes in his daughter; Heart  attack in his father; Hyperlipidemia in his sister; Hypertension in his mother; Nephrolithiasis in his father; Prostate cancer in his father; Sinusitis in his brother.  Social History:  He  reports that he quit smoking about 32 years ago. His smoking use included cigarettes and cigars. He has a 2.00 pack-year smoking history. He has never used smokeless tobacco. He reports current alcohol use of about 2.0 standard drinks of alcohol per week. He reports that he does not use drugs.

## 2019-01-25 NOTE — Patient Instructions (Signed)
Follow up in 1 year.

## 2019-02-01 ENCOUNTER — Encounter: Payer: Self-pay | Admitting: Internal Medicine

## 2019-02-01 ENCOUNTER — Ambulatory Visit (INDEPENDENT_AMBULATORY_CARE_PROVIDER_SITE_OTHER): Payer: Medicare Other | Admitting: Internal Medicine

## 2019-02-01 ENCOUNTER — Other Ambulatory Visit: Payer: Self-pay

## 2019-02-01 VITALS — BP 110/70 | HR 80 | Temp 98.0°F | Ht 74.0 in | Wt 270.1 lb

## 2019-02-01 DIAGNOSIS — R42 Dizziness and giddiness: Secondary | ICD-10-CM | POA: Diagnosis not present

## 2019-02-01 DIAGNOSIS — C61 Malignant neoplasm of prostate: Secondary | ICD-10-CM

## 2019-02-01 DIAGNOSIS — E039 Hypothyroidism, unspecified: Secondary | ICD-10-CM

## 2019-02-01 DIAGNOSIS — G4733 Obstructive sleep apnea (adult) (pediatric): Secondary | ICD-10-CM

## 2019-02-01 DIAGNOSIS — R7309 Other abnormal glucose: Secondary | ICD-10-CM | POA: Diagnosis not present

## 2019-02-01 DIAGNOSIS — E785 Hyperlipidemia, unspecified: Secondary | ICD-10-CM | POA: Diagnosis not present

## 2019-02-01 DIAGNOSIS — Z860100 Personal history of colon polyps, unspecified: Secondary | ICD-10-CM

## 2019-02-01 DIAGNOSIS — I6523 Occlusion and stenosis of bilateral carotid arteries: Secondary | ICD-10-CM

## 2019-02-01 DIAGNOSIS — K219 Gastro-esophageal reflux disease without esophagitis: Secondary | ICD-10-CM | POA: Diagnosis not present

## 2019-02-01 NOTE — Patient Instructions (Signed)
-  Nice seeing you today!!  -Schedule follow up for annual physical and wellness visit. Please come in fasting that day for lab work.
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
## 2019-02-16 DIAGNOSIS — Z8546 Personal history of malignant neoplasm of prostate: Secondary | ICD-10-CM | POA: Diagnosis not present

## 2019-02-23 ENCOUNTER — Other Ambulatory Visit: Payer: Self-pay

## 2019-05-04 ENCOUNTER — Encounter: Payer: Medicare Other | Admitting: Internal Medicine

## 2019-05-16 ENCOUNTER — Encounter: Payer: Self-pay | Admitting: Gastroenterology

## 2019-05-16 ENCOUNTER — Telehealth: Payer: Self-pay | Admitting: Gastroenterology

## 2019-05-16 NOTE — Telephone Encounter (Signed)
Pt called inquiring about his recall colon. Pls confirm if pt is due this year. Thank you.

## 2019-05-16 NOTE — Telephone Encounter (Signed)
Colon scheduled on 3/24 at 8:30am at [facility].

## 2019-05-16 NOTE — Telephone Encounter (Signed)
He is due for recall this year.  If he is having any problems he will need an appt.  If not, he can be a direct colon. Thank you

## 2019-05-18 DIAGNOSIS — H9041 Sensorineural hearing loss, unilateral, right ear, with unrestricted hearing on the contralateral side: Secondary | ICD-10-CM | POA: Diagnosis not present

## 2019-05-18 DIAGNOSIS — H9313 Tinnitus, bilateral: Secondary | ICD-10-CM | POA: Diagnosis not present

## 2019-05-18 DIAGNOSIS — H905 Unspecified sensorineural hearing loss: Secondary | ICD-10-CM | POA: Diagnosis not present

## 2019-05-18 DIAGNOSIS — H9319 Tinnitus, unspecified ear: Secondary | ICD-10-CM | POA: Diagnosis not present

## 2019-05-18 DIAGNOSIS — H838X3 Other specified diseases of inner ear, bilateral: Secondary | ICD-10-CM | POA: Diagnosis not present

## 2019-05-18 DIAGNOSIS — R42 Dizziness and giddiness: Secondary | ICD-10-CM | POA: Diagnosis not present

## 2019-06-06 ENCOUNTER — Ambulatory Visit (INDEPENDENT_AMBULATORY_CARE_PROVIDER_SITE_OTHER): Payer: Medicare Other | Admitting: Internal Medicine

## 2019-06-06 ENCOUNTER — Encounter: Payer: Self-pay | Admitting: Internal Medicine

## 2019-06-06 ENCOUNTER — Other Ambulatory Visit: Payer: Self-pay

## 2019-06-06 VITALS — BP 128/82 | HR 81 | Ht 74.0 in | Wt 272.2 lb

## 2019-06-06 DIAGNOSIS — R0609 Other forms of dyspnea: Secondary | ICD-10-CM

## 2019-06-06 DIAGNOSIS — R0782 Intercostal pain: Secondary | ICD-10-CM

## 2019-06-06 DIAGNOSIS — J9809 Other diseases of bronchus, not elsewhere classified: Secondary | ICD-10-CM | POA: Diagnosis not present

## 2019-06-06 DIAGNOSIS — J479 Bronchiectasis, uncomplicated: Secondary | ICD-10-CM | POA: Diagnosis not present

## 2019-06-06 DIAGNOSIS — R0600 Dyspnea, unspecified: Secondary | ICD-10-CM

## 2019-06-06 NOTE — Addendum Note (Signed)
Addended by: Lorretta Harp on: 06/06/2019 10:58 AM   Modules accepted: Orders

## 2019-06-06 NOTE — Patient Instructions (Addendum)
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 that can be contributing to shortness of breath -    Shortness of breath   -This is likely multifactorial and related to weight, physical conditioning, above bronchiectasis, sleep apnea and also stiff heart muscle called diastolic dysfunction seen on echocardiogram in 2019  -The symptom seems to be affecting her quality of life significantly  PLAN  -Proceed with pulmonary stress test on an exercise bike with EIB challenge so that we can differentiate which one of the above is a dominating force contributing to your shortness of breath  History of obstructive sleep apnea  -CPAP per Dr Halford Chessman   Intercostal pain in the left chest by surgical scar - resolved - no active fllowup   Followup -15-minute telephone visit to discuss the PFT results; can be made with nurse practitioner -  -Is a pulmonary stress test results are not usually significant you can consolidate all your follow-up with Dr. Halford Chessman because you do not have interstitial lung disease
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
## 2019-06-08 ENCOUNTER — Other Ambulatory Visit: Payer: Self-pay

## 2019-06-08 ENCOUNTER — Telehealth: Payer: Self-pay | Admitting: Gastroenterology

## 2019-06-08 ENCOUNTER — Ambulatory Visit (AMBULATORY_SURGERY_CENTER): Payer: Self-pay | Admitting: *Deleted

## 2019-06-08 VITALS — Temp 97.4°F | Ht 74.0 in | Wt 268.6 lb

## 2019-06-08 DIAGNOSIS — Z86010 Personal history of colonic polyps: Secondary | ICD-10-CM

## 2019-06-08 MED ORDER — SUPREP BOWEL PREP KIT 17.5-3.13-1.6 GM/177ML PO SOLN: ORAL | 0 refills | Status: DC

## 2019-06-08 NOTE — Telephone Encounter (Signed)
ERROR

## 2019-06-08 NOTE — Progress Notes (Addendum)
Pt had second covid vaccine on 05-26-19- no need for test  Pt is aware that care partner will wait in the car during procedure; if they feel like they will be too hot or cold to wait in the car; they may wait in the 4 th floor lobby. Patient is aware to bring only one care partner. We want them to wear a mask (we do not have any that we can provide them), practice social distancing, and we will check their temperatures when they get here.  I did remind the patient that their care partner needs to stay in the parking lot the entire time and have a cell phone available, we will call them when the pt is ready for discharge. Patient will wear mask into building.   No egg or soy allergy  No home oxygen use   No medications for weight loss taken  emmi information given   No trouble with anesthesia, difficulty with intubation or hx/fam hx of malignant hyperthermia per pt   1115- pt called back and states prep is too expensive.  Sample left at the third floor desk for him

## 2019-06-17 ENCOUNTER — Other Ambulatory Visit (HOSPITAL_COMMUNITY)
Admission: RE | Admit: 2019-06-17 | Discharge: 2019-06-17 | Disposition: A | Discharge status: Home / Self Care | Payer: Medicare Other | Source: Ambulatory Visit | Attending: Pulmonary Disease | Admitting: Pulmonary Disease

## 2019-06-17 DIAGNOSIS — Z01812 Encounter for preprocedural laboratory examination: Secondary | ICD-10-CM | POA: Diagnosis not present

## 2019-06-17 DIAGNOSIS — Z20822 Contact with and (suspected) exposure to covid-19: Secondary | ICD-10-CM | POA: Insufficient documentation

## 2019-06-17 LAB — SARS CORONAVIRUS 2 (TAT 6-24 HRS): SARS Coronavirus 2: NEGATIVE

## 2019-06-20 ENCOUNTER — Other Ambulatory Visit (HOSPITAL_COMMUNITY): Payer: Self-pay | Admitting: *Deleted

## 2019-06-20 ENCOUNTER — Other Ambulatory Visit: Payer: Self-pay

## 2019-06-20 ENCOUNTER — Ambulatory Visit (HOSPITAL_COMMUNITY): Payer: Medicare Other | Attending: Internal Medicine

## 2019-06-20 DIAGNOSIS — R0600 Dyspnea, unspecified: Secondary | ICD-10-CM | POA: Diagnosis not present

## 2019-06-20 DIAGNOSIS — J479 Bronchiectasis, uncomplicated: Secondary | ICD-10-CM | POA: Insufficient documentation

## 2019-06-20 DIAGNOSIS — R0609 Other forms of dyspnea: Secondary | ICD-10-CM

## 2019-06-21 ENCOUNTER — Encounter (HOSPITAL_COMMUNITY): Payer: Self-pay | Admitting: *Deleted

## 2019-06-21 NOTE — Progress Notes (Signed)
Patient expressed interest to exercise during CPX appointment yesterday and was unsure with his options due to [place] and his current health status. Per patient request, I contacted Dr. Oval Linsey for exercise clearance prior to offering an online ZOOM exercise program that resembles silver sneakers style. This is offered via [employer] instructors. I emailed the link and attachment for patient's discretion to participate.   Provided contact information for future exercise related questions.    Landis Martins, MS, ACSM-RCEP Clinical Exercise Physiologist/ Health and Wellness Coach

## 2019-06-22 ENCOUNTER — Other Ambulatory Visit: Payer: Self-pay

## 2019-06-22 ENCOUNTER — Ambulatory Visit (AMBULATORY_SURGERY_CENTER): Payer: Medicare Other | Admitting: Gastroenterology

## 2019-06-22 ENCOUNTER — Encounter: Payer: Self-pay | Admitting: Gastroenterology

## 2019-06-22 VITALS — BP 117/72 | HR 61 | Temp 97.1°F | Resp 14 | Ht 74.0 in | Wt 268.6 lb

## 2019-06-22 DIAGNOSIS — Z800 Family history of malignant neoplasm of digestive organs: Secondary | ICD-10-CM | POA: Diagnosis not present

## 2019-06-22 DIAGNOSIS — D124 Benign neoplasm of descending colon: Secondary | ICD-10-CM

## 2019-06-22 DIAGNOSIS — Z86010 Personal history of colonic polyps: Secondary | ICD-10-CM | POA: Diagnosis not present

## 2019-06-22 DIAGNOSIS — K635 Polyp of colon: Secondary | ICD-10-CM | POA: Diagnosis not present

## 2019-06-22 MED ORDER — SODIUM CHLORIDE 0.9 % IV SOLN: 500.0000 mL | Freq: Once | INTRAVENOUS | Status: DC

## 2019-06-22 NOTE — Patient Instructions (Signed)
YOU HAD AN ENDOSCOPIC PROCEDURE TODAY AT THE [facility]:   Refer to the procedure report that was given to you for any specific questions about what was found during the examination.  If the procedure report does not answer your questions, please call your gastroenterologist to clarify.  If you requested that your care partner not be given the details of your procedure findings, then the procedure report has been included in a sealed envelope for you to review at your convenience later.  YOU SHOULD EXPECT: Some feelings of bloating in the abdomen. Passage of more gas than usual.  Walking can help get rid of the air that was put into your GI tract during the procedure and reduce the bloating. If you had a lower endoscopy (such as a colonoscopy or flexible sigmoidoscopy) you may notice spotting of blood in your stool or on the toilet paper. If you underwent a bowel prep for your procedure, you may not have a normal bowel movement for a few days.  Please Note:  You might notice some irritation and congestion in your nose or some drainage.  This is from the oxygen used during your procedure.  There is no need for concern and it should clear up in a day or so.  SYMPTOMS TO REPORT IMMEDIATELY:   Following lower endoscopy (colonoscopy or flexible sigmoidoscopy):  Excessive amounts of blood in the stool  Significant tenderness or worsening of abdominal pains  Swelling of the abdomen that is new, acute  Fever of 100F or higher    For urgent or emergent issues, a gastroenterologist can be reached at any hour by calling [phone]. Do not use MyChart messaging for urgent concerns.    DIET:  We do recommend a small meal at first, but then you may proceed to your regular diet.  Drink plenty of fluids but you should avoid alcoholic beverages for 24 hours.  ACTIVITY:  You should plan to take it easy for the rest of today and you should NOT DRIVE or use heavy machinery until tomorrow  (because of the sedation medicines used during the test).    FOLLOW UP: Our staff will call the number listed on your records 48-72 hours following your procedure to check on you and address any questions or concerns that you may have regarding the information given to you following your procedure. If we do not reach you, we will leave a message.  We will attempt to reach you two times.  During this call, we will ask if you have developed any symptoms of COVID 19. If you develop any symptoms (ie: fever, flu-like symptoms, shortness of breath, cough etc.) before then, please call [phone].  If you test positive for Covid 19 in the 2 weeks post procedure, please call and report this information to us.    If any biopsies were taken you will be contacted by phone or by letter within the next 1-3 weeks.  Please call us at [phone] if you have not heard about the biopsies in 3 weeks.    SIGNATURES/CONFIDENTIALITY: You and/or your care partner have signed paperwork which will be entered into your electronic medical record.  These signatures attest to the fact that that the information above on your After Visit Summary has been reviewed and is understood.  Full responsibility of the confidentiality of this discharge information lies with you and/or your care-partner.   Resume medications. Information given on polyps and hemorrhoids. 

## 2019-06-22 NOTE — Progress Notes (Signed)
Pt's states no medical or surgical changes since previsit or office visit. 

## 2019-06-22 NOTE — Op Note (Signed)
[facility] Patient Name: Lamontre Memon Procedure Date: 06/22/2019 8:27 AM MRN: [id number] Endoscopist: Milus Banister , MD Age: 69 Referring MD:  Date of Birth: December 25, 1950 Gender: Male Account #: [id number] Procedure:                Colonoscopy Indications:              High risk colon cancer surveillance: Personal                            history of colonic polyps; :>1cm TA removed in                            past, more recent colonoscopy single                           small TA, father had colon cancer; colonoscopy 2016                            two subCM adenomas removed. Medicines:                Monitored Anesthesia Care Procedure:                Pre-Anesthesia Assessment:                           - Prior to the procedure, a History and Physical                            was performed, and patient medications and                            allergies were reviewed. The patient's tolerance of                            previous anesthesia was also reviewed. The risks                            and benefits of the procedure and the sedation                            options and risks were discussed with the patient.                            All questions were answered, and informed consent                            was obtained. Prior Anticoagulants: The patient has                            taken no previous anticoagulant or antiplatelet                            agents except for aspirin. ASA Grade Assessment: II                            -  A patient with mild systemic disease. After                            reviewing the risks and benefits, the patient was                            deemed in satisfactory condition to undergo the                            procedure.                           After obtaining informed consent, the colonoscope                            was passed under direct vision. Throughout the   procedure, the patient's blood pressure, pulse, and                            oxygen saturations were monitored continuously. The                            Colonoscope was introduced through the anus and                            advanced to the the cecum, identified by                            appendiceal orifice and ileocecal valve. The                            colonoscopy was performed without difficulty. The                            patient tolerated the procedure well. The quality                            of the bowel preparation was good. The ileocecal                            valve, appendiceal orifice, and rectum were                            photographed. Scope In: 8:31:16 AM Scope Out: 8:44:59 AM Scope Withdrawal Time: 0 hours 10 minutes 26 seconds  Total Procedure Duration: 0 hours 13 minutes 43 seconds  Findings:                 A 4 mm polyp was found in the descending colon. The                            polyp was sessile. The polyp was removed with a                            cold snare. Resection and retrieval  were complete.                           Internal hemorrhoids were found. The hemorrhoids                            were small.                           The exam was otherwise without abnormality on                            direct and retroflexion views. Complications:            No immediate complications. Estimated blood loss:                            None. Estimated Blood Loss:     Estimated blood loss: none. Impression:               - One 4 mm polyp in the descending colon, removed                            with a cold snare. Resected and retrieved.                           - Internal hemorrhoids.                           - The examination was otherwise normal on direct                            and retroflexion views. Recommendation:           - Patient has a contact number available for                            emergencies. The  signs and symptoms of potential                            delayed complications were discussed with the                            patient. Return to normal activities tomorrow.                            Written discharge instructions were provided to the                            patient.                           - Resume previous diet.                           - Continue present medications.                           -  Await pathology results. Milus Banister, MD 06/22/2019 8:48:24 AM This report has been signed electronically.

## 2019-06-22 NOTE — Progress Notes (Signed)
Pt Drowsy. VSS. To PACU, report to RN. No anesthetic complications noted.  

## 2019-06-22 NOTE — Progress Notes (Signed)
Called to room to assist during endoscopic procedure.  Patient ID and intended procedure confirmed with present staff. Received instructions for my participation in the procedure from the performing physician.  

## 2019-06-22 NOTE — Progress Notes (Signed)
Temperature taken by L.C., VS taken by D.T. 

## 2019-06-24 ENCOUNTER — Telehealth: Payer: Self-pay | Admitting: *Deleted

## 2019-06-24 ENCOUNTER — Encounter: Payer: Self-pay | Admitting: Gastroenterology

## 2019-06-24 NOTE — Telephone Encounter (Signed)
Attempted f/u phone call. No answer, left message.

## 2019-06-24 NOTE — Telephone Encounter (Signed)
  Follow up Call-  Call back number 06/22/2019  Post procedure Call Back phone  # [phone]  Permission to leave phone message Yes  Some recent data might be hidden     Patient questions:  Do you have a fever, pain , or abdominal swelling? No. Pain Score  0 *  Have you tolerated food without any problems? Yes.    Have you been able to return to your normal activities? Yes.    Do you have any questions about your discharge instructions: Diet   No. Medications  No. Follow up visit  No.  Do you have questions or concerns about your Care? No.  Actions: * If pain score is 4 or above: No action needed, pain <4.  1. Have you developed a fever since your procedure? no  2.   Have you had an respiratory symptoms (SOB or cough) since your procedure? no  3.   Have you tested positive for COVID 19 since your procedure no  4.   Have you had any family members/close contacts diagnosed with the COVID 19 since your procedure?  no   If yes to any of these questions please route to Joylene John, RN and Erenest Rasher, RN

## 2019-06-28 ENCOUNTER — Other Ambulatory Visit: Payer: Self-pay

## 2019-06-29 ENCOUNTER — Encounter: Payer: Self-pay | Admitting: Internal Medicine

## 2019-06-29 ENCOUNTER — Ambulatory Visit (INDEPENDENT_AMBULATORY_CARE_PROVIDER_SITE_OTHER): Payer: Medicare Other | Admitting: Internal Medicine

## 2019-06-29 ENCOUNTER — Encounter: Payer: Medicare Other | Admitting: Internal Medicine

## 2019-06-29 VITALS — BP 110/70 | HR 85 | Temp 97.3°F | Ht 73.5 in | Wt 268.4 lb

## 2019-06-29 DIAGNOSIS — Z0000 Encounter for general adult medical examination without abnormal findings: Secondary | ICD-10-CM | POA: Diagnosis not present

## 2019-06-29 DIAGNOSIS — Z8669 Personal history of other diseases of the nervous system and sense organs: Secondary | ICD-10-CM | POA: Diagnosis not present

## 2019-06-29 DIAGNOSIS — E039 Hypothyroidism, unspecified: Secondary | ICD-10-CM

## 2019-06-29 DIAGNOSIS — I6523 Occlusion and stenosis of bilateral carotid arteries: Secondary | ICD-10-CM

## 2019-06-29 DIAGNOSIS — R7309 Other abnormal glucose: Secondary | ICD-10-CM | POA: Diagnosis not present

## 2019-06-29 DIAGNOSIS — K219 Gastro-esophageal reflux disease without esophagitis: Secondary | ICD-10-CM

## 2019-06-29 DIAGNOSIS — C61 Malignant neoplasm of prostate: Secondary | ICD-10-CM

## 2019-06-29 DIAGNOSIS — E785 Hyperlipidemia, unspecified: Secondary | ICD-10-CM | POA: Diagnosis not present

## 2019-06-29 LAB — CBC WITH DIFFERENTIAL/PLATELET
Basophils Absolute: 0 10*3/uL (ref 0.0–0.1)
Basophils Relative: 0.6 % (ref 0.0–3.0)
Eosinophils Absolute: 0.3 10*3/uL (ref 0.0–0.7)
Eosinophils Relative: 4.9 % (ref 0.0–5.0)
HCT: 38.5 % — ABNORMAL LOW (ref 39.0–52.0)
Hemoglobin: 12.9 g/dL — ABNORMAL LOW (ref 13.0–17.0)
Lymphocytes Relative: 30 % (ref 12.0–46.0)
Lymphs Abs: 1.9 10*3/uL (ref 0.7–4.0)
MCHC: 33.4 g/dL (ref 30.0–36.0)
MCV: 89.9 fl (ref 78.0–100.0)
Monocytes Absolute: 0.5 10*3/uL (ref 0.1–1.0)
Monocytes Relative: 8 % (ref 3.0–12.0)
Neutro Abs: 3.5 10*3/uL (ref 1.4–7.7)
Neutrophils Relative %: 56.5 % (ref 43.0–77.0)
Platelets: 139 10*3/uL — ABNORMAL LOW (ref 150.0–400.0)
RBC: 4.28 Mil/uL (ref 4.22–5.81)
RDW: 15.1 % (ref 11.5–15.5)
WBC: 6.3 10*3/uL (ref 4.0–10.5)

## 2019-06-29 LAB — COMPREHENSIVE METABOLIC PANEL
ALT: 18 U/L (ref 0–53)
AST: 21 U/L (ref 0–37)
Albumin: 3.8 g/dL (ref 3.5–5.2)
Alkaline Phosphatase: 50 U/L (ref 39–117)
BUN: 13 mg/dL (ref 6–23)
CO2: 28 mEq/L (ref 19–32)
Calcium: 8.9 mg/dL (ref 8.4–10.5)
Chloride: 104 mEq/L (ref 96–112)
Creatinine, Ser: 0.74 mg/dL (ref 0.40–1.50)
GFR: 126.47 mL/min (ref >60.00)
Glucose, Bld: 82 mg/dL (ref 70–99)
Potassium: 4.1 mEq/L (ref 3.5–5.1)
Sodium: 139 mEq/L (ref 135–145)
Total Bilirubin: 0.4 mg/dL (ref 0.2–1.2)
Total Protein: 7 g/dL (ref 6.0–8.3)

## 2019-06-29 LAB — HEMOGLOBIN A1C: Hgb A1c MFr Bld: 5.8 % (ref 4.6–6.5)

## 2019-06-29 LAB — PSA: PSA: 0.09 ng/mL — ABNORMAL LOW (ref 0.10–4.00)

## 2019-06-29 LAB — LIPID PANEL
Cholesterol: 135 mg/dL (ref 0–200)
HDL: 48.9 mg/dL (ref >39.00)
LDL Cholesterol: 74 mg/dL (ref 0–99)
NonHDL: 85.7
Total CHOL/HDL Ratio: 3
Triglycerides: 57 mg/dL (ref 0.0–149.0)
VLDL: 11.4 mg/dL (ref 0.0–40.0)

## 2019-06-29 LAB — TSH: TSH: 2.94 u[IU]/mL (ref 0.35–4.50)

## 2019-06-29 NOTE — Patient Instructions (Signed)
-Nice seeing you today!!  -Lab work today; will notify you once results are available.  -tetanus and shingles vaccines 6 weeks after last COVID vaccine.  -We will schedule an [place] of your neck arteries.  -Schedule follow up in 1 year or sooner as needed.   Preventive Care 21 Years and Older, Male Preventive care refers to lifestyle choices and visits with your health care provider that can promote health and wellness. This includes:  A yearly physical exam. This is also called an annual well check.  Regular dental and eye exams.  Immunizations.  Screening for certain conditions.  Healthy lifestyle choices, such as diet and exercise. What can I expect for my preventive care visit? Physical exam Your health care provider will check:  Height and weight. These may be used to calculate body mass index (BMI), which is a measurement that tells if you are at a healthy weight.  Heart rate and blood pressure.  Your skin for abnormal spots. Counseling Your health care provider may ask you questions about:  Alcohol, tobacco, and drug use.  Emotional well-being.  Home and relationship well-being.  Sexual activity.  Eating habits.  History of falls.  Memory and ability to understand (cognition).  Work and work Statistician. What immunizations do I need?  Influenza (flu) vaccine  This is recommended every year. Tetanus, diphtheria, and pertussis (Tdap) vaccine  You may need a Td booster every 10 years. Varicella (chickenpox) vaccine  You may need this vaccine if you have not already been vaccinated. Zoster (shingles) vaccine  You may need this after age 26. Pneumococcal conjugate (PCV13) vaccine  One dose is recommended after age 25. Pneumococcal polysaccharide (PPSV23) vaccine  One dose is recommended after age 15. Measles, mumps, and rubella (MMR) vaccine  You may need at least one dose of MMR if you were born in 1957 or later. You may also need a second  dose. Meningococcal conjugate (MenACWY) vaccine  You may need this if you have certain conditions. Hepatitis A vaccine  You may need this if you have certain conditions or if you travel or work in places where you may be exposed to hepatitis A. Hepatitis B vaccine  You may need this if you have certain conditions or if you travel or work in places where you may be exposed to hepatitis B. Haemophilus influenzae type b (Hib) vaccine  You may need this if you have certain conditions. You may receive vaccines as individual doses or as more than one vaccine together in one shot (combination vaccines). Talk with your health care provider about the risks and benefits of combination vaccines. What tests do I need? Blood tests  Lipid and cholesterol levels. These may be checked every 5 years, or more frequently depending on your overall health.  Hepatitis C test.  Hepatitis B test. Screening  Lung cancer screening. You may have this screening every year starting at age 87 if you have a 30-pack-year history of smoking and currently smoke or have quit within the past 15 years.  Colorectal cancer screening. All adults should have this screening starting at age 22 and continuing until age 34. Your health care provider may recommend screening at age 81 if you are at increased risk. You will have tests every 1-10 years, depending on your results and the type of screening test.  Prostate cancer screening. Recommendations will vary depending on your family history and other risks.  Diabetes screening. This is done by checking your blood sugar (glucose) after you  a while (fasting). You may have this done every 1-3 years.  Abdominal aortic aneurysm (AAA) screening. You may need this if you are a current or former smoker.  Sexually transmitted disease (STD) testing. Follow these instructions at home: Eating and drinking  Eat a diet that includes fresh fruits and vegetables, whole grains, lean  protein, and low-fat dairy products. Limit your intake of foods with high amounts of sugar, saturated fats, and salt.  Take vitamin and mineral supplements as recommended by your health care provider.  Do not drink alcohol if your health care provider tells you not to drink.  If you drink alcohol: ? Limit how much you have to 0-2 drinks a day. ? Be aware of how much alcohol is in your drink. In the [place] one drink equals one 12 oz bottle of beer (355 mL), one 5 oz glass of wine (148 mL), or one 1 oz glass of hard liquor (44 mL). Lifestyle  Take daily care of your teeth and gums.  Stay active. Exercise for at least 30 minutes on 5 or more days each week.  Do not use any products that contain nicotine or tobacco, such as cigarettes, e-cigarettes, and chewing tobacco. If you need help quitting, ask your health care provider.  If you are sexually active, practice safe sex. Use a condom or other form of protection to prevent STIs (sexually transmitted infections).  Talk with your health care provider about taking a low-dose aspirin or statin. What's next?  Visit your health care provider once a year for a well check visit.  Ask your health care provider how often you should have your eyes and teeth checked.  Stay up to date on all vaccines. This information is not intended to replace advice given to you by your health care provider. Make sure you discuss any questions you have with your health care provider. Document Revised: 03/11/2018 Document Reviewed: 03/11/2018 Elsevier Patient Education  2020 [employer]  

## 2019-06-29 NOTE — Progress Notes (Signed)
Established Patient Office Visit     This visit occurred during the SARS-CoV-2 public health emergency.  Safety protocols were in place, including screening questions prior to the visit, additional usage of staff PPE, and extensive cleaning of exam room while observing appropriate contact time as indicated for disinfecting solutions.    CC/Reason for Visit: Subsequent Medicare wellness visit and annual follow-up chronic conditions  HPI: Chad [name] is a 69 y.o. male who is coming in today for the above mentioned reasons. Past Medical History is significant for: Hyperlipidemia on rosuvastatin followed by Dr. Oval Linsey (cardiology), history of prostate cancer status post seed implants followed by [facility] in [place].  In his chart he has a history of type 2 diabetes and GERD as well as hypothyroidism but he states these 3 issues are never been a problem for him.  He also has a history of obstructive sleep apnea interstitial lung disease followed by Dr. Chase Caller on nightly CPAP.  He also has a prior history of carotid artery stenosis but states he has not seen vascular or had follow-up ultrasound in many years.  He has been doing well and has no acute complaints today.  He has routine eye and dental care.  He had his colonoscopy last week.  He continues to follow-up with his urologist in [place] for his prostate cancer.  He completed his Covid vaccination series in late February. Prescription medication he is taking at this time is rosuvastatin.   Past Medical/Surgical History: Past Medical History:  Diagnosis Date  . Anemia    'YEARS AGO"  . Arthritis   . ASTHMA 02/11/2007       . CARBUNCLE AND FURUNCLE OF LEG EXCEPT FOOT 08/14/2009   Qualifier: Diagnosis of  By: Wynona Luna   . Carotid stenosis, asymptomatic 01/19/2018   1-39% on carotid Doppler 2015.   Marland Kitchen CARPAL TUNNEL SYNDROME, LEFT 02/15/2009   Qualifier: Diagnosis of  By: Wynona Luna   . Chronic interstitial lung  disease ([place])    PULMONARY INFILTRATE  --  PULMOLOGIST-  DR CLANCE  . Chronic steroid use    INTERSTITIAL LUNG DISEASE  . COLONIC POLYPS, HX OF 12/23/2006   Qualifier: Diagnosis of  By: Marca Ancona RMA, Lucy    . Complication of anesthesia    EMERGENCE COMBATIVENESS---  PLEASE REFER TO VATS 01-05-1012 PROCEDURE ,  DR Linna Caprice DOCUMENTED GRADE IV DIFFICULT VISUAL AIRWAY  . DIABETES MELLITUS, TYPE II, BORDERLINE 11/14/2008   Qualifier: Diagnosis of  By: Wynona Luna   . Dyspnea on exertion   . Elevated PSA 08/13/2011  . GERD 12/23/2006   Qualifier: Diagnosis of  By: Marca Ancona RMA, Lucy    . History of anal fissures   . History of hypothyroidism   . Hyperlipidemia 05/01/2016  . Hypothyroidism 12/23/2006   Qualifier: Diagnosis of  By: Marca Ancona RMA, Lucy    . MEDIASTINAL LYMPHADENOPATHY 08/30/2007   Qualifier: Diagnosis of  By: Joya Gaskins MD, Burnett Harry   . 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 Oral thrush 04/22/2012  . OSA (obstructive sleep apnea) 12/23/2006   Pt prefers to stay on auto setting.    . Pneumonia 11/13/2011  . Prostate cancer ([place]) 06/09/12 bx   Adenocarcinoma  . PULMONARY EDEMA 07/05/2007   Qualifier: Diagnosis of  By: Joya Gaskins MD, Burnett Harry   . Pulmonary infiltrate 12/23/2011   H/o LN, pericardial effusion, pleural effusion unknown origin 2009.  ?resolved with prednisone?? Mediastinoscopy 2009:  Benign lymphoid  hyperplasia Ct chest 11/2011: scattered GGO, interstitial changes, small pulmonary nodules, mild LN No response to prolonged course of abx. Autoimmune w/u:  ESR 78, but ACE/RF/DSDNA/CCP/ENA/SCL70/HP panel/ANCA all neg.  ANA low positive (prob neg). VATS bx 12/2011:  CIP with organizing pna.  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  V/Q 01/2012:  Ok +++ response to steroids (started 12/2011) PFT's 12/2012:  No obstruction, TLC 3.25 (42%), DLCO 44% Pulmonary rehab 2014 xrays and symptoms greatly improved with steroids, and no recurrence off steroids   . RHINOSINUSITIS, ACUTE 01/29/2010    Qualifier: Diagnosis of  By: Wynona Luna   . SHOULDER PAIN, LEFT 11/14/2008   Qualifier: Diagnosis of  By: Wynona Luna   . Sinusitis 11/26/2010  . Sleep apnea    on cpap  . Thrombocytopenia ([place])     Past Surgical History:  Procedure Laterality Date  . CARDIOVASCULAR STRESS TEST  01-10-2008   NORMAL EXERCISE STRESS TEST AT GOOD WORKLOAD  . COLONOSCOPY  01-15-2009   polyps and tics  . LUNG BIOPSY    . MEDIASTINOSCOPY  07-12-2007   BILATERAL PLEURAL EFFUSIONS  . NASAL SINUS SURGERY    . POLYPECTOMY    . prostate biopsy  06/11/12   Adenocarcinoma  . RADIOACTIVE SEED IMPLANT N/A 12/23/2012   Procedure: RADIOACTIVE SEED IMPLANT;  Surgeon: Franchot Gallo, MD;  Location: [facility];  Service: Urology;  Laterality: N/A;   81   seeds implanted   . TRANSTHORACIC ECHOCARDIOGRAM  12-31-2011   NORMAL LVF/   EF  55-60%  . VIDEO ASSISTED THORACOSCOPY (VATS)/ LYMPH NODE SAMPLING Left 01-05-2012   LUNG AND LYMPH NODE BX'S (CHRONIC INTERSTITIAL PNEUMONIA)    Social History:  reports that he quit smoking about 33 years ago. His smoking use included cigarettes and cigars. He has a 2.00 pack-year smoking history. He has never used smokeless tobacco. He reports current alcohol use of about 2.0 standard drinks of alcohol per week. He reports that he does not use drugs.  Allergies: Allergies  Allergen Reactions  . Ivp Dye [Iodinated Diagnostic Agents] Anaphylaxis    Coma for a day in '09  . Lactose Intolerance (Gi) Other (See Comments)    Family History:  Family History  Problem Relation Age of Onset  . Heart attack Father   . Prostate cancer Father        seed implant  . Nephrolithiasis Father   . Colon cancer Father        passed 08-2013  . Bradycardia Father        pacemaker  . Alzheimer's disease Mother   . Hypertension Mother   . Hyperlipidemia Sister   . Sinusitis Brother   . Diabetes Daughter   . Cancer Paternal Uncle   . Alzheimer's disease  Maternal Grandmother   . Esophageal cancer Neg Hx   . Stomach cancer Neg Hx   . Rectal cancer Neg Hx      Current Outpatient Medications:  .  Ibuprofen (ADVIL) 200 MG CAPS, Take 600 mg by mouth as needed. , Disp: , Rfl:  .  rosuvastatin (CRESTOR) 20 MG tablet, TAKE 1 TABLET BY MOUTH EVERY DAY, Disp: 90 tablet, Rfl: 3  Review of Systems:  Constitutional: Denies fever, chills, diaphoresis, appetite change and fatigue.  HEENT: Denies photophobia, eye pain, redness, hearing loss, ear pain, congestion, sore throat, rhinorrhea, sneezing, mouth sores, trouble swallowing, neck pain, neck stiffness and tinnitus.   Respiratory: Denies SOB, DOE, cough, chest tightness,  and  wheezing.   Cardiovascular: Denies chest pain, palpitations and leg swelling.  Gastrointestinal: Denies nausea, vomiting, abdominal pain, diarrhea, constipation, blood in stool and abdominal distention.  Genitourinary: Denies dysuria, urgency, frequency, hematuria, flank pain and difficulty urinating.  Endocrine: Denies: hot or cold intolerance, sweats, changes in hair or nails, polyuria, polydipsia. Musculoskeletal: Denies myalgias, back pain, joint swelling, arthralgias and gait problem.  Skin: Denies pallor, rash and wound.  Neurological: Denies dizziness, seizures, syncope, weakness, light-headedness, numbness and headaches.  Hematological: Denies adenopathy. Easy bruising, personal or family bleeding history  Psychiatric/Behavioral: Denies suicidal ideation, mood changes, confusion, nervousness, sleep disturbance and agitation    Physical Exam: Vitals:   06/29/19 0659  BP: 110/70  Pulse: 85  Temp: (!) 97.3 F (36.3 C)  TempSrc: Temporal  SpO2: 97%  Weight: 268 lb 6.4 oz (121.7 kg)  Height: 6' 1.5" (1.867 m)    Body mass index is 34.93 kg/m.   Constitutional: NAD, calm, comfortable Eyes: PERRL, lids and conjunctivae normal ENMT: Mucous membranes are moist. Tympanic membrane is pearly white, no erythema or  bulging. Neck: normal, supple, no masses, no thyromegaly Respiratory: clear to auscultation bilaterally, no wheezing, no crackles. Normal respiratory effort. No accessory muscle use.  Cardiovascular: Regular rate and rhythm, no murmurs / rubs / gallops. No extremity edema. 2+ pedal pulses. No carotid bruits.  Abdomen: no tenderness, no masses palpated. No hepatosplenomegaly. Bowel sounds positive.  Musculoskeletal: no clubbing / cyanosis. No joint deformity upper and lower extremities. Good ROM, no contractures. Normal muscle tone.  Skin: no rashes, lesions, ulcers. No induration Neurologic: CN 2-12 grossly intact. Sensation intact, DTR normal. Strength 5/5 in all 4.  Psychiatric: Normal judgment and insight. Alert and oriented x 3. Normal mood.    Subsequent Medicare wellness visit   1. Risk factors, based on past  M,S,F -cardiovascular disease risk factors include age, gender, history of hyperlipidemia   2.  Physical activities: He does not do much in the way of physical activity   3.  Depression/mood:  Stable, not depressed   4.  Hearing:  Has tinnitus of his right ear, visited audiology recently, no hearing aids were recommended   5.  ADL's: Independent in all ADLs   6.  Fall risk:  Low fall risk   7.  Home safety: No problems identified   8.  Height weight, and visual acuity: Height and weight as above, visual acuity is 20/20 with each eye independently and together   9.  Counseling:  Advised Tdap and shingles vaccination series at his local pharmacy 6 weeks out from his last Covid vaccine, we have also discussed healthy lifestyle including better food choices and increased physical activity   10. Lab orders based on risk factors: Laboratory update will be reviewed   11. Referral :  None today   12. Care plan:  Follow-up in 1 year unless lab abnormalities identified that require closer follow-up   13. Cognitive assessment:  No cognitive impairment   14. Screening: Patient  provided with a written and personalized 5-10 year screening schedule in the AVS.   yes   15. Provider List Update:   PCP, cardiology (Dr. Oval Linsey), pulmonology (Dr. Chase Caller)  53. Advance Directives: Full code     Office Visit from 06/29/2019 in [place] at [facility]  PHQ-9 Total Score  3      Fall Risk  06/29/2019 02/23/2019 02/01/2019 01/14/2018 12/30/2016  Falls in the past year? 0 0 0 Yes Yes  Comment - Emmi Telephone Survey:  data to providers prior to load - misstepped coming down the steps   Number falls in past yr: 0 - 0 1 1  Comment - - - - -  Injury with Fall? 0 - 0 Yes -  Follow up - - - Education provided Education provided     Impression and Plan:  Encounter for preventive health examination -He has routine eye and dental care. -Due for Tdap booster and shingles vaccination series, he will wait 6 weeks from his last Covid vaccine to obtain, otherwise immunizations are up-to-date and age-appropriate. -Screening labs today. -Healthy lifestyle has been discussed in detail. -PSA requested by patient today, he is followed closely by urologist in [place] for his prostate cancer, he has radioactive seed implants. -He just had his colonoscopy last week and was asked to return in 5 years.  History of obstructive sleep apnea -On nightly CPAP, followed by pulmonary, Dr. Chase Caller.  Asymptomatic bilateral carotid artery stenosis  - Plan: US Carotid Duplex Bilateral, last in 2016 showed no carotid artery stenosis, if this 1 is negative no need to continue follow-up.  Hyperlipidemia, unspecified hyperlipidemia type  - Plan: Lipid panel -Continue rosuvastatin.  Prostate cancer [facility])  - Plan: PSA -Continue follow-up with urologist in [place].  DIABETES MELLITUS, TYPE II, BORDERLINE  -He does not believe this is an active issue, his last A1c was 5.8 in 2019, recheck A1c today.  Gastroesophageal reflux disease without esophagitis -Well-controlled not on PPI  therapy.  Hypothyroidism, unspecified type  - Plan: TSH -He is not on thyroid replacement.   Patient Instructions  -Nice seeing you today!!  -Lab work today; will notify you once results are available.  -tetanus and shingles vaccines 6 weeks after last COVID vaccine.  -We will schedule an [place] of your neck arteries.  -Schedule follow up in 1 year or sooner as needed.   Preventive Care 83 Years and Older, Male Preventive care refers to lifestyle choices and visits with your health care provider that can promote health and wellness. This includes:  A yearly physical exam. This is also called an annual well check.  Regular dental and eye exams.  Immunizations.  Screening for certain conditions.  Healthy lifestyle choices, such as diet and exercise. What can I expect for my preventive care visit? Physical exam Your health care provider will check:  Height and weight. These may be used to calculate body mass index (BMI), which is a measurement that tells if you are at a healthy weight.  Heart rate and blood pressure.  Your skin for abnormal spots. Counseling Your health care provider may ask you questions about:  Alcohol, tobacco, and drug use.  Emotional well-being.  Home and relationship well-being.  Sexual activity.  Eating habits.  History of falls.  Memory and ability to understand (cognition).  Work and work Statistician. What immunizations do I need?  Influenza (flu) vaccine  This is recommended every year. Tetanus, diphtheria, and pertussis (Tdap) vaccine  You may need a Td booster every 10 years. Varicella (chickenpox) vaccine  You may need this vaccine if you have not already been vaccinated. Zoster (shingles) vaccine  You may need this after age 47. Pneumococcal conjugate (PCV13) vaccine  One dose is recommended after age 10. Pneumococcal polysaccharide (PPSV23) vaccine  One dose is recommended after age 87. Measles, mumps, and rubella  (MMR) vaccine  You may need at least one dose of MMR if you were born in 1957 or later. You may also need a second dose. Meningococcal  conjugate (MenACWY) vaccine  You may need this if you have certain conditions. Hepatitis A vaccine  You may need this if you have certain conditions or if you travel or work in places where you may be exposed to hepatitis A. Hepatitis B vaccine  You may need this if you have certain conditions or if you travel or work in places where you may be exposed to hepatitis B. Haemophilus influenzae type b (Hib) vaccine  You may need this if you have certain conditions. You may receive vaccines as individual doses or as more than one vaccine together in one shot (combination vaccines). Talk with your health care provider about the risks and benefits of combination vaccines. What tests do I need? Blood tests  Lipid and cholesterol levels. These may be checked every 5 years, or more frequently depending on your overall health.  Hepatitis C test.  Hepatitis B test. Screening  Lung cancer screening. You may have this screening every year starting at age 59 if you have a 30-pack-year history of smoking and currently smoke or have quit within the past 15 years.  Colorectal cancer screening. All adults should have this screening starting at age 30 and continuing until age 37. Your health care provider may recommend screening at age 36 if you are at increased risk. You will have tests every 1-10 years, depending on your results and the type of screening test.  Prostate cancer screening. Recommendations will vary depending on your family history and other risks.  Diabetes screening. This is done by checking your blood sugar (glucose) after you have not eaten for a while (fasting). You may have this done every 1-3 years.  Abdominal aortic aneurysm (AAA) screening. You may need this if you are a current or former smoker.  Sexually transmitted disease (STD)  testing. Follow these instructions at home: Eating and drinking  Eat a diet that includes fresh fruits and vegetables, whole grains, lean protein, and low-fat dairy products. Limit your intake of foods with high amounts of sugar, saturated fats, and salt.  Take vitamin and mineral supplements as recommended by your health care provider.  Do not drink alcohol if your health care provider tells you not to drink.  If you drink alcohol: ? Limit how much you have to 0-2 drinks a day. ? Be aware of how much alcohol is in your drink. In the [place] one drink equals one 12 oz bottle of beer (355 mL), one 5 oz glass of wine (148 mL), or one 1 oz glass of hard liquor (44 mL). Lifestyle  Take daily care of your teeth and gums.  Stay active. Exercise for at least 30 minutes on 5 or more days each week.  Do not use any products that contain nicotine or tobacco, such as cigarettes, e-cigarettes, and chewing tobacco. If you need help quitting, ask your health care provider.  If you are sexually active, practice safe sex. Use a condom or other form of protection to prevent STIs (sexually transmitted infections).  Talk with your health care provider about taking a low-dose aspirin or statin. What's next?  Visit your health care provider once a year for a well check visit.  Ask your health care provider how often you should have your eyes and teeth checked.  Stay up to date on all vaccines. This information is not intended to replace advice given to you by your health care provider. Make sure you discuss any questions you have with your health care provider. Document Revised:  03/11/2018 Document Reviewed: 03/11/2018 Elsevier Patient Education  2020 [place], MD [facility]

## 2019-06-30 NOTE — Progress Notes (Signed)
Will reivew CPST results on vosit 07/14/19. But based on this ok for referral to [facility]. Please let Chad [name] of rehab know. I have copied her too.   Thanks  MR

## 2019-07-11 ENCOUNTER — Ambulatory Visit (HOSPITAL_COMMUNITY): Admission: RE | Admit: 2019-07-11 | Payer: Medicare Other | Source: Ambulatory Visit

## 2019-07-14 ENCOUNTER — Other Ambulatory Visit: Payer: Self-pay

## 2019-07-14 ENCOUNTER — Ambulatory Visit (INDEPENDENT_AMBULATORY_CARE_PROVIDER_SITE_OTHER): Payer: Medicare Other | Admitting: Internal Medicine

## 2019-07-14 ENCOUNTER — Encounter: Payer: Self-pay | Admitting: Internal Medicine

## 2019-07-14 VITALS — BP 118/68 | HR 87 | Temp 97.3°F | Ht 74.0 in | Wt 271.6 lb

## 2019-07-14 DIAGNOSIS — R0602 Shortness of breath: Secondary | ICD-10-CM

## 2019-07-14 DIAGNOSIS — I6523 Occlusion and stenosis of bilateral carotid arteries: Secondary | ICD-10-CM

## 2019-07-14 NOTE — Patient Instructions (Addendum)
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 that can be contributing to shortness of breath -  Plan  - no further followup in ILD clinic   Shortness of breath   -This is likely multifactorial and related to weight, physical conditioning, above bronchiectasis, sleep apnea and also stiff heart muscle called diastolic dysfunction seen on echocardiogram in 2019  - CPST test 2021 indicates primary drver is your lung issues - which is a sequelae frpm your 2013 COP/BOOP illnes  -The symptom seems to be affecting her quality of life significantly  PLAN  -recommend weight loss - goal 195#  - refer pulmonary rehab if you are interested - no further followup ILD clinic  History of obstructive sleep apnea  -CPAP per Dr Halford Chessman    Followup -with Dr Halford Chessman in 6 months or sooner if needed -= recommend serial monitoring with CT scan/PFT as clinically indicated
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
## 2019-08-02 ENCOUNTER — Ambulatory Visit: Payer: Medicare Other | Admitting: Pulmonary Disease

## 2019-08-30 DIAGNOSIS — M545 Low back pain: Secondary | ICD-10-CM | POA: Diagnosis not present

## 2019-08-31 DIAGNOSIS — J3489 Other specified disorders of nose and nasal sinuses: Secondary | ICD-10-CM | POA: Diagnosis not present

## 2019-08-31 DIAGNOSIS — H9041 Sensorineural hearing loss, unilateral, right ear, with unrestricted hearing on the contralateral side: Secondary | ICD-10-CM | POA: Diagnosis not present

## 2019-08-31 DIAGNOSIS — R42 Dizziness and giddiness: Secondary | ICD-10-CM | POA: Diagnosis not present

## 2019-08-31 DIAGNOSIS — J329 Chronic sinusitis, unspecified: Secondary | ICD-10-CM | POA: Diagnosis not present

## 2020-01-10 ENCOUNTER — Telehealth: Payer: Self-pay | Admitting: Cardiovascular Disease

## 2020-01-10 DIAGNOSIS — M545 Low back pain, unspecified: Secondary | ICD-10-CM | POA: Diagnosis not present

## 2020-01-10 DIAGNOSIS — M542 Cervicalgia: Secondary | ICD-10-CM | POA: Diagnosis not present

## 2020-01-10 NOTE — Telephone Encounter (Signed)
Patient would like a call regarding his appointment on 01/19/2020. Did not disclose why.

## 2020-01-10 NOTE — Telephone Encounter (Signed)
Pt questioning if he need blood work prior to appointment scheduled for 10/21. He report he has another appointment afterwards and want to be proactive.  Will forward to MD and nurse.

## 2020-01-10 NOTE — Telephone Encounter (Signed)
Pt updated and verbalized understanding.  

## 2020-01-10 NOTE — Telephone Encounter (Signed)
His lab work is up-to-date.  No need to come in ahead a time.

## 2020-01-19 ENCOUNTER — Encounter: Payer: Self-pay | Admitting: Cardiovascular Disease

## 2020-01-19 ENCOUNTER — Ambulatory Visit (INDEPENDENT_AMBULATORY_CARE_PROVIDER_SITE_OTHER): Payer: Medicare Other | Admitting: Cardiovascular Disease

## 2020-01-19 ENCOUNTER — Other Ambulatory Visit: Payer: Self-pay

## 2020-01-19 VITALS — BP 118/72 | HR 81 | Ht 74.0 in | Wt 262.0 lb

## 2020-01-19 DIAGNOSIS — G4733 Obstructive sleep apnea (adult) (pediatric): Secondary | ICD-10-CM | POA: Diagnosis not present

## 2020-01-19 DIAGNOSIS — Z0000 Encounter for general adult medical examination without abnormal findings: Secondary | ICD-10-CM | POA: Diagnosis not present

## 2020-01-19 DIAGNOSIS — I6523 Occlusion and stenosis of bilateral carotid arteries: Secondary | ICD-10-CM

## 2020-01-19 DIAGNOSIS — E785 Hyperlipidemia, unspecified: Secondary | ICD-10-CM | POA: Diagnosis not present

## 2020-01-19 DIAGNOSIS — I77810 Thoracic aortic ectasia: Secondary | ICD-10-CM | POA: Diagnosis not present

## 2020-01-19 NOTE — Progress Notes (Signed)
Cardiology Office Note   Date:  01/19/2020   ID:  Chad [name], DOB [birth year], MRN [id number]  PCP:  Isaac Bliss, Rayford Halsted, MD  Cardiologist:   Skeet Latch, MD   No chief complaint on file.    History of Present Illness: Chad [name] is a 69 y.o. male with chronic diastolic heart failure, asymptomatic coronary calcifications, interstitial lung disease/pulmonary fibrosis, OSA on CPAP, hyperlipidemia, and diabetes who presents for follow-up.  He initially saw Dr. Curt Bears 10/2015.  He was referred because of a CT scan that showed coronary calcifications and calcified aortic valve.  At the time he was completely asymptomatic.  Fasting lipids revealed an LDL of 126.  He had an echo 12/2011 that revealed LVEF 55 to 60%, and a mildly dilated ascending aorta.  Carotid Dopplers 11/2013 revealed 1-39% stenosis bilaterally.  He had a CPX 02/2016 that revealed mild functional impairment in gas exchange and mild restrictive physiology.  It was felt that his imitations at peak exercise were mostly attributed to body habitus.  He had a repeat echo 12/2017 that showed LVEF 60-65% with grade 2 diastolic dysfunction and no aortic aneurysm.  He continues to have intermittent dizzy spells.  He had one today while driving.  He notes it when rolling in bed as well.  He had a CPT with Dr. Chase Caller on 05/2019 that revealed moderate functional impairment mostly due to body habitus and deconditioning.  There is also some chronotropic incompetence and restriction on spirometry.  He has been walking for exercise once or twice per week for 20-25 minutes.  He has no exertional chest pain but does get short of breath after well.  He started a plant-based diet and has lost over 10 pounds in the last month.  He notes that he does get very hungry with his diet low.    Past Medical History:  Diagnosis Date  . Anemia    'YEARS AGO"  . Arthritis   . ASTHMA 02/11/2007       . CARBUNCLE AND FURUNCLE OF LEG  EXCEPT FOOT 08/14/2009   Qualifier: Diagnosis of  By: Wynona Luna   . Carotid stenosis, asymptomatic 01/19/2018   1-39% on carotid Doppler 2015.   Marland Kitchen CARPAL TUNNEL SYNDROME, LEFT 02/15/2009   Qualifier: Diagnosis of  By: Wynona Luna   . Chronic interstitial lung disease ([place])    PULMONARY INFILTRATE  --  PULMOLOGIST-  DR CLANCE  . Chronic steroid use    INTERSTITIAL LUNG DISEASE  . COLONIC POLYPS, HX OF 12/23/2006   Qualifier: Diagnosis of  By: Marca Ancona RMA, Lucy    . Complication of anesthesia    EMERGENCE COMBATIVENESS---  PLEASE REFER TO VATS 01-05-1012 PROCEDURE ,  DR Linna Caprice DOCUMENTED GRADE IV DIFFICULT VISUAL AIRWAY  . DIABETES MELLITUS, TYPE II, BORDERLINE 11/14/2008   Qualifier: Diagnosis of  By: Wynona Luna   . Dyspnea on exertion   . Elevated PSA 08/13/2011  . GERD 12/23/2006   Qualifier: Diagnosis of  By: Marca Ancona RMA, Lucy    . History of anal fissures   . History of hypothyroidism   . Hyperlipidemia 05/01/2016  . Hypothyroidism 12/23/2006   Qualifier: Diagnosis of  By: Marca Ancona RMA, Lucy    . MEDIASTINAL LYMPHADENOPATHY 08/30/2007   Qualifier: Diagnosis of  By: Joya Gaskins MD, Burnett Harry   . Mild ascending aorta dilatation ([place]) 01/19/2018  . Oral thrush 04/22/2012  . OSA (obstructive sleep apnea) 12/23/2006   Pt prefers to stay  on auto setting.    . Pneumonia 11/13/2011  . Prostate cancer ([place]) 06/09/12 bx   Adenocarcinoma  . PULMONARY EDEMA 07/05/2007   Qualifier: Diagnosis of  By: Joya Gaskins MD, Burnett Harry   . Pulmonary infiltrate 12/23/2011   H/o LN, pericardial effusion, pleural effusion unknown origin 2009.  ?resolved with prednisone?? Mediastinoscopy 2009:  Benign lymphoid hyperplasia Ct chest 11/2011: scattered GGO, interstitial changes, small pulmonary nodules, mild LN No response to prolonged course of abx. Autoimmune w/u:  ESR 78, but ACE/RF/DSDNA/CCP/ENA/SCL70/HP panel/ANCA all neg.  ANA low positive (prob neg). VATS bx 12/2011:  CIP with organizing pna.  Also hemorrhagic  infarct in one area.  V/Q 01/2012:  Ok +++ response to steroids (started 12/2011) PFT's 12/2012:  No obstruction, TLC 3.25 (42%), DLCO 44% Pulmonary rehab 2014 xrays and symptoms greatly improved with steroids, and no recurrence off steroids   . RHINOSINUSITIS, ACUTE 01/29/2010   Qualifier: Diagnosis of  By: Wynona Luna   . SHOULDER PAIN, LEFT 11/14/2008   Qualifier: Diagnosis of  By: Wynona Luna   . Sinusitis 11/26/2010  . Sleep apnea    on cpap  . Thrombocytopenia ([place])     Past Surgical History:  Procedure Laterality Date  . CARDIOVASCULAR STRESS TEST  01-10-2008   NORMAL EXERCISE STRESS TEST AT GOOD WORKLOAD  . COLONOSCOPY  01-15-2009   polyps and tics  . LUNG BIOPSY    . MEDIASTINOSCOPY  07-12-2007   BILATERAL PLEURAL EFFUSIONS  . NASAL SINUS SURGERY    . POLYPECTOMY    . prostate biopsy  06/11/12   Adenocarcinoma  . RADIOACTIVE SEED IMPLANT N/A 12/23/2012   Procedure: RADIOACTIVE SEED IMPLANT;  Surgeon: Franchot Gallo, MD;  Location: [facility];  Service: Urology;  Laterality: N/A;   81   seeds implanted   . TRANSTHORACIC ECHOCARDIOGRAM  12-31-2011   NORMAL LVF/   EF  55-60%  . VIDEO ASSISTED THORACOSCOPY (VATS)/ LYMPH NODE SAMPLING Left 01-05-2012   LUNG AND LYMPH NODE BX'S (CHRONIC INTERSTITIAL PNEUMONIA)     Current Outpatient Medications  Medication Sig Dispense Refill  . Ibuprofen (ADVIL) 200 MG CAPS Take 600 mg by mouth as needed.     . rosuvastatin (CRESTOR) 20 MG tablet TAKE 1 TABLET BY MOUTH EVERY DAY 90 tablet 3   No current facility-administered medications for this visit.    Allergies:   Ivp dye [iodinated diagnostic agents] and Lactose intolerance (gi)    Social History:  The patient  reports that he quit smoking about 33 years ago. His smoking use included cigarettes and cigars. He has a 2.00 pack-year smoking history. He has never used smokeless tobacco. He reports current alcohol use of about 2.0 standard drinks of alcohol  per week. He reports that he does not use drugs.   Family History:  The patient's family history includes Alzheimer's disease in his maternal grandmother and mother; Bradycardia in his father; Cancer in his paternal uncle; Colon cancer in his father; Diabetes in his daughter; Heart attack in his father; Hyperlipidemia in his sister; Hypertension in his mother; Nephrolithiasis in his father; Prostate cancer in his father; Sinusitis in his brother.    ROS:  Please see the history of present illness.   Otherwise, review of systems are positive for none.   All other systems are reviewed and negative.    VS:  BP 118/72   Pulse 81   Ht 6' 2"  (1.88 m)   Wt 262 lb (118.8 kg)  SpO2 99%   BMI 33.64 kg/m  , BMI Body mass index is 33.64 kg/m. GENERAL:  Well appearing HEENT: Pupils equal round and reactive, fundi not visualized, oral mucosa unremarkable NECK:  No jugular venous distention, waveform within normal limits, carotid upstroke brisk and symmetric, no bruits LUNGS:  Clear to auscultation bilaterally HEART:  RRR.  PMI not displaced or sustained,S1 and S2 within normal limits, no S3, no S4, no clicks, no rubs, no murmurs ABD:  Flat, positive bowel sounds normal in frequency in pitch, no bruits, no rebound, no guarding, no midline pulsatile mass, no hepatomegaly, no splenomegaly EXT:  2 plus pulses throughout, trace edema, no cyanosis no clubbing SKIN:  No rashes no nodules NEURO:  Cranial nerves II through XII grossly intact, motor grossly intact throughout PSYCH:  Cognitively intact, oriented to person place and time   EKG:  EKG is ordered today. The ekg ordered today demonstrates sinus rhythm.  Rate 78 bpm.  RSR' in V1.  LVH. 01/11/2019: Sinus rhythm.  Rate 74 bpm.  Left axis deviation. 01/19/2020: Sinus rhythm.  Rate 81 bpm.  Incomplete right bundle branch block.  LVH.  Echo 01/26/18: Study Conclusions  - Left ventricle: The cavity size was normal. Wall thickness was   normal.  Systolic function was normal. The estimated ejection   fraction was in the range of 60% to 65%. Wall motion was normal;   there were no regional wall motion abnormalities. Features are   consistent with a pseudonormal left ventricular filling pattern,   with concomitant abnormal relaxation and increased filling   pressure (grade 2 diastolic dysfunction). - Aortic valve: Mildly calcified annulus. There was mild   regurgitation.   Recent Labs: 06/29/2019: ALT 18; BUN 13; Creatinine, Ser 0.74; Hemoglobin 12.9; Platelets 139.0; Potassium 4.1; Sodium 139; TSH 2.94    Lipid Panel    Component Value Date/Time   CHOL 135 06/29/2019 0730   CHOL 131 09/13/2018 0929   TRIG 57.0 06/29/2019 0730   HDL 48.90 06/29/2019 0730   HDL 62 09/13/2018 0929   CHOLHDL 3 06/29/2019 0730   VLDL 11.4 06/29/2019 0730   LDLCALC 74 06/29/2019 0730   LDLCALC 57 09/13/2018 0929      Wt Readings from Last 3 Encounters:  01/19/20 262 lb (118.8 kg)  07/14/19 271 lb 9.6 oz (123.2 kg)  06/29/19 268 lb 6.4 oz (121.7 kg)      ASSESSMENT AND PLAN:  # Asymptomatic coronary calcification:  # Hyperlipidemia: Mr. Szeto has no symptoms concerning for ischemia.  LDL was slightly above goal when checked 05/2019.  However he has since switched to a plant-based diet.  Continue rosuvastatin and dietary interventions.  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  Not seen on repeat echo 12/2017.  # Mild carotid stenosis:  1-39% 11/2013.  No stenosis noted 12/2017.  Rosuvastatin as above.  # OSA on CPAP: Continue CPAP.  Current medicines are reviewed at length with the patient today.  The patient does not have concerns regarding medicines.  The following changes have been made:  no change  Labs/ tests ordered today include:   Orders Placed This Encounter  Procedures  . EKG 12-Lead     Disposition:   FU with Raihan Kimmel C. Oval Linsey, MD, [facility] in 1 year.      Signed, Shanell Aden C. Oval Linsey, MD, [facility]   01/19/2020 2:37 PM    [place]

## 2020-01-19 NOTE — Patient Instructions (Signed)
Medication Instructions:  Your physician recommends that you continue on your current medications as directed. Please refer to the Current Medication list given to you today.  *If you need a refill on your cardiac medications before your next appointment, please call your pharmacy*  Lab Work: NONE  Testing/Procedures: NONE  Follow-Up: At [facility], you and your health needs are our priority.  As part of our continuing mission to provide you with exceptional heart care, we have created designated Provider Care Teams.  These Care Teams include your primary Cardiologist (physician) and Advanced Practice Providers (APPs -  Physician Assistants and Nurse Practitioners) who all work together to provide you with the care you need, when you need it.  We recommend signing up for the patient portal called "MyChart".  Sign up information is provided on this After Visit Summary.  MyChart is used to connect with patients for Virtual Visits (Telemedicine).  Patients are able to view lab/test results, encounter notes, upcoming appointments, etc.  Non-urgent messages can be sent to your provider as well.   To learn more about what you can do with MyChart, go to [email or web address].    Your next appointment:   12 month(s)  You will receive a reminder letter in the mail two months in advance. If you don't receive a letter, please call our office to schedule the follow-up appointment.  The format for your next appointment:   In Person  Provider:   You may see DR Lihue or one of the following Advanced Practice Providers on your designated Care Team:    Luke Kilroy, PA-C  Callie Goodrich, PA-C  Jesse Cleaver, FNP     

## 2020-02-08 ENCOUNTER — Other Ambulatory Visit: Payer: Self-pay | Admitting: Cardiovascular Disease

## 2020-02-14 DIAGNOSIS — Z23 Encounter for immunization: Secondary | ICD-10-CM | POA: Diagnosis not present

## 2020-02-17 DIAGNOSIS — R310 Gross hematuria: Secondary | ICD-10-CM | POA: Diagnosis not present

## 2020-02-29 ENCOUNTER — Encounter: Payer: Self-pay | Admitting: Pulmonary Disease

## 2020-02-29 ENCOUNTER — Other Ambulatory Visit: Payer: Self-pay

## 2020-02-29 ENCOUNTER — Ambulatory Visit (INDEPENDENT_AMBULATORY_CARE_PROVIDER_SITE_OTHER): Payer: Medicare Other | Admitting: Pulmonary Disease

## 2020-02-29 VITALS — BP 124/68 | HR 73 | Temp 97.5°F | Ht 74.0 in | Wt 271.8 lb

## 2020-02-29 DIAGNOSIS — G4733 Obstructive sleep apnea (adult) (pediatric): Secondary | ICD-10-CM | POA: Diagnosis not present

## 2020-02-29 DIAGNOSIS — I6523 Occlusion and stenosis of bilateral carotid arteries: Secondary | ICD-10-CM | POA: Diagnosis not present

## 2020-02-29 DIAGNOSIS — Z23 Encounter for immunization: Secondary | ICD-10-CM | POA: Diagnosis not present

## 2020-02-29 DIAGNOSIS — Z9989 Dependence on other enabling machines and devices: Secondary | ICD-10-CM

## 2020-02-29 NOTE — Progress Notes (Signed)
[facility]  Chief Complaint  Patient presents with  . Follow-up    shortness of breath with activity, not getting much at night    Constitutional:  BP 124/68 (BP Location: Left Arm, Cuff Size: Normal)   Pulse 73   Temp (!) 97.5 F (36.4 C) (Other (Comment)) Comment (Src): wrist  Ht 6' 2"  (1.88 m)   Wt 271 lb 12.8 oz (123.3 kg)   SpO2 99% Comment: room air  BMI 34.90 kg/m   Past Medical History:  Prostate cancer, Hypothyroidism, Pulmonary infiltrates 2013 s/p VATs lung bx  Past Surgical History:  His  has a past surgical history that includes Mediastinoscopy (07-12-2007); Nasal sinus surgery; prostate biopsy (06/11/12); Video assisted thoracoscopy (vats)/ lymph node sampling (Left, 01-05-2012); Cardiovascular stress test (01-10-2008); transthoracic echocardiogram (12-31-2011); Radioactive seed implant (N/A, 12/23/2012); Colonoscopy (01-15-2009); Lung biopsy; and Polypectomy.  Brief Summary:  Chad [name] is a 69 y.o. male former smoker with obstructive sleep apnea and bronchiectasis.      Subjective:   Uses CPAP nightly.  Gets about 5 yrs sleep.  Sometimes naps in the afternoon.  Getting puffy under his eyes.  Breathing about the same.  Not having cough, wheeze, or sputum.  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  CPAP 10/29/19 to 11/27/19 >> used on 29 of 30 nights with average 4 hrs 59 min.  Average AHI 0.4 with CPAP 8 cm H2O  Cardiac Tests:   Echo 01/26/18 >> EF 60 to 65%, grade 2 DD, mild AR  CPET 06/20/19 >> deconditioning, restriction, chronotropic incompetence  Social History:  He  reports that he quit smoking about 33 years ago. His smoking use included cigarettes and cigars. He has a 2.00 pack-year smoking history. He has never used smokeless tobacco. He reports current alcohol use of about 2.0 standard drinks of alcohol per week. He reports that he does not use drugs.  Family History:  His family history includes Alzheimer's disease in his maternal grandmother and mother; Bradycardia in his father; Cancer in his paternal uncle; Colon cancer in his father; Diabetes in his daughter; Heart attack in his father; Hyperlipidemia in his sister; Hypertension in his mother; Nephrolithiasis in his father; Prostate cancer in his father; Sinusitis in his brother.     Assessment/Plan:   Obstructive sleep apnea. - he is compliant with CPAP and reports benefit from therapy - uses [facility] for his DME - his machine is more than 70 yrs old, not functioning properly and not amenable to repair -  will arrange for new CPAP machine - will change pressure from 8 to 6 cm H2O  Tracheobronchomalacia. Bronchiectasis. - f/u with Dr. Boris Sharper  Time Spent Involved in Patient Care on Day of Examination:  23 minutes  Follow up:  Patient Instructions  High dose flu shot today  Will have [place] arrange for new CPAP machine at 6 cm water pressure  Follow up in 3 months   Medication List:   Allergies as of 02/29/2020      Reactions   Ivp Dye [iodinated Diagnostic  Agents] Anaphylaxis   Coma for a day in '09   Lactose Intolerance (gi) Other (See Comments)      Medication List       Accurate as of February 29, 2020 12:42 PM. If you have any questions, ask your nurse or doctor.        Advil 200 MG Caps Generic drug: Ibuprofen Take 600 mg by mouth as needed.   rosuvastatin 20 MG tablet Commonly known as: CRESTOR TAKE 1 TABLET BY MOUTH EVERY DAY       Signature:  Chesley Mires, MD [place] Pager - [phone] 02/29/2020, 12:42 PM

## 2020-02-29 NOTE — Patient Instructions (Signed)
High dose flu shot today  Will have [place] arrange for new CPAP machine at 6 cm water pressure  Follow up in 3 months

## 2020-03-07 DIAGNOSIS — J329 Chronic sinusitis, unspecified: Secondary | ICD-10-CM | POA: Diagnosis not present

## 2020-03-27 DIAGNOSIS — N3289 Other specified disorders of bladder: Secondary | ICD-10-CM | POA: Diagnosis not present

## 2020-03-27 DIAGNOSIS — R310 Gross hematuria: Secondary | ICD-10-CM | POA: Diagnosis not present

## 2020-05-17 ENCOUNTER — Telehealth: Payer: Self-pay | Admitting: Pulmonary Disease

## 2020-05-18 NOTE — Telephone Encounter (Signed)
Working on this one.  

## 2020-05-18 NOTE — Telephone Encounter (Signed)
Faxed to [facility] in [place], [place].

## 2020-05-18 NOTE — Telephone Encounter (Signed)
Rec'd fax confirm.  °

## 2020-07-13 DIAGNOSIS — R300 Dysuria: Secondary | ICD-10-CM | POA: Diagnosis not present

## 2020-07-13 DIAGNOSIS — Z8546 Personal history of malignant neoplasm of prostate: Secondary | ICD-10-CM | POA: Diagnosis not present

## 2020-07-13 DIAGNOSIS — R319 Hematuria, unspecified: Secondary | ICD-10-CM | POA: Diagnosis not present

## 2020-07-13 DIAGNOSIS — R350 Frequency of micturition: Secondary | ICD-10-CM | POA: Diagnosis not present

## 2020-07-13 DIAGNOSIS — R3915 Urgency of urination: Secondary | ICD-10-CM | POA: Diagnosis not present

## 2020-07-13 DIAGNOSIS — R310 Gross hematuria: Secondary | ICD-10-CM | POA: Diagnosis not present

## 2020-08-02 ENCOUNTER — Telehealth: Payer: Self-pay | Admitting: Pulmonary Disease

## 2020-08-02 MED ORDER — DOXYCYCLINE HYCLATE 100 MG PO TABS: 100.0000 mg | ORAL_TABLET | Freq: Two times a day (BID) | ORAL | 0 refills | Status: DC

## 2020-08-02 NOTE — Telephone Encounter (Signed)
Primary Pulmonologist: VS Last office visit and with whom:  07/14/19 with MR for SOB, 02/29/20 with VS for SOB What do we see them for (pulmonary problems): bronchiectasis and OSA Last OV assessment/plan: Obstructive sleep apnea. - he is compliant with CPAP and reports benefit from therapy - uses [facility] for his DME - his machine is more than 71 yrs old, not functioning properly and not amenable to repair - will arrange for new CPAP machine - will change pressure from 8 to 6 cm H2O  Tracheobronchomalacia. Bronchiectasis. - f/u with Dr. Boris Sharper  Was appointment offered to patient (explain)?  Patient wanted recommendations first.    Reason for call: Spoke with patient. He stated that he believes he has developed a sinus infection. He has noticed an increase in coughing but is not coughing up anything at the moment. He has also noticed an increase in wheezing, especially at night. He does not have a sore throat but did state his throat feels raw from the drainage. Slight facial pain around his eyes and nose.   (examples of things to ask: : When did symptoms start? Fever? Cough? Productive? Color to sputum? More sputum than usual? Wheezing? Have you needed increased oxygen? Are you taking your respiratory medications? What over the counter measures have you tried?)  Allergies  Allergen Reactions  . Ivp Dye [Iodinated Diagnostic Agents] Anaphylaxis    Coma for a day in '09  . Lactose Intolerance (Gi) Other (See Comments)    Immunization History  Administered Date(s) Administered  . Fluad Quad(high Dose 65+) 01/18/2019, 02/29/2020  . Influenza Split 01/02/2011, 03/08/2012  . Influenza Whole 01/29/2010  . Influenza, High Dose Seasonal PF 12/27/2015, 12/30/2016, 03/26/2018  . Influenza,inj,Quad PF,6+ Mos 12/28/2012, 01/25/2014, 01/25/2015  . PFIZER(Purple Top)SARS-COV-2 Vaccination 05/05/2019, 05/26/2019  . Pneumococcal Conjugate-13 05/09/2015  . Pneumococcal Polysaccharide-23  03/21/2008, 05/01/2016  . Td 11/15/2002   He wants to know if he could have something called in for him. Pharmacy is [facility] in [place].   BI, can you please advise since MR is out of the office today? Thanks!

## 2020-08-02 NOTE — Telephone Encounter (Signed)
Can offer acute visit with APP.   Garner Nash, DO [facility] 08/02/2020 12:29 PM

## 2020-08-02 NOTE — Telephone Encounter (Signed)
Spoke with the pt and notified of response per Dr Valeta Harms  Pt verbalized understanding  Rx was sent  Nothing further needed Pt to keep appt with TP

## 2020-08-02 NOTE — Telephone Encounter (Signed)
Start doxycycline 100mg  BID for 7 days and keep appt with TP  Garner Nash, DO [facility] 08/02/2020 4:00 PM .

## 2020-08-02 NOTE — Telephone Encounter (Signed)
Called and spoke to pt. Informed him of the recs per Dr. Valeta Harms. Pt states he is unable to come in for an appt as he works out of town. Pt states the soonest he can come in for an appt is Monday, 5/9, appt made. Pt questioning if there is anything that can be prescribed for the over the weekend.   Dr. Valeta Harms please advise.

## 2020-08-06 ENCOUNTER — Ambulatory Visit (INDEPENDENT_AMBULATORY_CARE_PROVIDER_SITE_OTHER): Payer: Medicare Other

## 2020-08-06 ENCOUNTER — Other Ambulatory Visit: Payer: Self-pay

## 2020-08-06 ENCOUNTER — Ambulatory Visit: Payer: Medicare Other | Admitting: Adult Health

## 2020-08-06 ENCOUNTER — Encounter: Payer: Self-pay | Admitting: Adult Health

## 2020-08-06 ENCOUNTER — Ambulatory Visit (INDEPENDENT_AMBULATORY_CARE_PROVIDER_SITE_OTHER): Payer: Medicare Other | Admitting: Adult Health

## 2020-08-06 VITALS — BP 120/68 | HR 87 | Temp 98.5°F | Ht 74.0 in | Wt 269.8 lb

## 2020-08-06 DIAGNOSIS — J479 Bronchiectasis, uncomplicated: Secondary | ICD-10-CM | POA: Diagnosis not present

## 2020-08-06 DIAGNOSIS — J209 Acute bronchitis, unspecified: Secondary | ICD-10-CM | POA: Insufficient documentation

## 2020-08-06 DIAGNOSIS — R059 Cough, unspecified: Secondary | ICD-10-CM | POA: Diagnosis not present

## 2020-08-06 DIAGNOSIS — G4733 Obstructive sleep apnea (adult) (pediatric): Secondary | ICD-10-CM

## 2020-08-06 MED ORDER — AZITHROMYCIN 250 MG PO TABS: ORAL_TABLET | ORAL | 0 refills | Status: AC

## 2020-08-06 MED ORDER — BENZONATATE 200 MG PO CAPS: 200.0000 mg | ORAL_CAPSULE | Freq: Three times a day (TID) | ORAL | 1 refills | Status: DC | PRN

## 2020-08-06 MED ORDER — PREDNISONE 20 MG PO TABS: 20.0000 mg | ORAL_TABLET | Freq: Every day | ORAL | 0 refills | Status: DC

## 2020-08-06 NOTE — Assessment & Plan Note (Signed)
Cont on CPAP At bedtime  

## 2020-08-06 NOTE — Patient Instructions (Addendum)
Stop Doxycycline .  Begin Zpack take as directed  Mucinex DM Twice daily  As needed  Cough/congestion  Tessalon Three times a day  As needed  Cough  Prednisone 20mg  daily for 5 days  Chest xray today  Continue on CPAP At bedtime   Follow up with Dr. Halford Chessman  In 3-4 months and As needed   Please contact office for sooner follow up if symptoms do not improve or worsen or seek emergency care

## 2020-08-06 NOTE — Progress Notes (Signed)
@Patient  ID: Chad [name], male    DOB: June 24, 1950, 70 y.o.   MRN: [id number]  Chief Complaint  Patient presents with  . 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  CPAP 10/29/19 to 11/27/19 >> used on 29 of 30 nights with average 4 hrs 59 min.  Average AHI 0.4 with CPAP 8 cm H2O  Cardiac Tests:   Echo 01/26/18 >> EF 60 to 65%, grade 2 DD, mild AR  CPET 06/20/19 >> deconditioning, restriction, chronotropic incompetence   08/06/2020 Follow up : Bronchiectasis  Patient presents for a follow-up visit.  Patient complains of 1 week of nasal congestion sinus pressure and drainage.  With increased cough.  Has had some intermittent wheezing.  Patient called into the office on Aug 02, 2020 and was called in  doxycycline for 7 days for acute URI/bronchitis. .  Patient is on day 5 of 7. He complains he can not tolerate, makes him sick on his stomach . Caused him to throw up several times. . Was up coughing a lot last night .  No hemoptysis , chest pain , orthopnea or edema.  Did not take antibiotic this am, worried he would get sick again.   Remains on CPAP At bedtime  . Doing well . Wears each night for about  7 hrs    Allergies  Allergen Reactions  . Ivp Dye [Iodinated Diagnostic Agents] Anaphylaxis    Coma for a day in '09  . Doxycycline Nausea Only  . Lactose Intolerance (Gi) Other (See Comments)    Immunization History  Administered Date(s) Administered  . Fluad Quad(high Dose 65+) 01/18/2019, 02/29/2020  . Influenza Split 01/02/2011, 03/08/2012  . Influenza Whole 01/29/2010  . Influenza, High Dose Seasonal PF 12/27/2015, 12/30/2016, 03/26/2018  . Influenza,inj,Quad PF,6+ Mos 12/28/2012, 01/25/2014, 01/25/2015  . Influenza-Unspecified 12/30/2019  . PFIZER(Purple Top)SARS-COV-2 Vaccination 05/05/2019, 05/26/2019, 02/14/2020  . Pneumococcal Conjugate-13 05/09/2015  . Pneumococcal Polysaccharide-23 03/21/2008, 05/01/2016  . Td 11/15/2002    Past Medical History:  Diagnosis Date  . Anemia    'YEARS AGO"  . Arthritis   . ASTHMA 02/11/2007       . CARBUNCLE AND FURUNCLE OF LEG EXCEPT FOOT 08/14/2009   Qualifier: Diagnosis of  By: Wynona Luna   . Carotid stenosis, asymptomatic 01/19/2018   1-39% on carotid Doppler 2015.   Marland Kitchen CARPAL TUNNEL SYNDROME, LEFT 02/15/2009   Qualifier: Diagnosis of  By: Wynona Luna   . Chronic interstitial lung disease ([place])    PULMONARY INFILTRATE  --  PULMOLOGIST-  DR CLANCE  . Chronic steroid use    INTERSTITIAL LUNG DISEASE  . COLONIC POLYPS, HX OF 12/23/2006   Qualifier: Diagnosis of  By: Larose Kells    . Complication of anesthesia    EMERGENCE COMBATIVENESS---  PLEASE REFER TO VATS 01-05-1012 PROCEDURE ,  DR Linna Caprice DOCUMENTED GRADE IV DIFFICULT VISUAL AIRWAY  . DIABETES MELLITUS, TYPE II, BORDERLINE 11/14/2008   Qualifier: Diagnosis of  By: Wynona Luna   . Dyspnea on exertion   . Elevated PSA 08/13/2011  . GERD 12/23/2006   Qualifier: Diagnosis of  By: Marca Ancona RMA, Lucy    . History of anal fissures   . History of hypothyroidism   . Hyperlipidemia 05/01/2016  . Hypothyroidism 12/23/2006   Qualifier: Diagnosis of  By: Marca Ancona RMA,  Lucy    . MEDIASTINAL LYMPHADENOPATHY 08/30/2007   Qualifier: Diagnosis of  By: Joya Gaskins MD, Burnett Harry   . Mild ascending aorta dilatation ([place]) 01/19/2018  . Oral thrush 04/22/2012  . OSA (obstructive sleep apnea) 12/23/2006   Pt prefers to stay on auto setting.    . Pneumonia 11/13/2011  . Prostate cancer ([place]) 06/09/12 bx   Adenocarcinoma  . PULMONARY EDEMA 07/05/2007   Qualifier: Diagnosis of  By: Joya Gaskins MD, Burnett Harry   . Pulmonary infiltrate 12/23/2011   H/o LN, pericardial effusion, pleural effusion unknown origin 2009.  ?resolved with prednisone?? Mediastinoscopy 2009:  Benign lymphoid hyperplasia Ct chest 11/2011: scattered GGO, interstitial changes, small pulmonary nodules, mild LN No response to prolonged course of abx. Autoimmune w/u:  ESR 78, but ACE/RF/DSDNA/CCP/ENA/SCL70/HP panel/ANCA all neg.  ANA low positive (prob neg). VATS bx 12/2011:  CIP with organizing pna.  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  V/Q 01/2012:  Ok +++ response to steroids (started 12/2011) PFT's 12/2012:  No obstruction, TLC 3.25 (42%), DLCO 44% Pulmonary rehab 2014 xrays and symptoms greatly improved with steroids, and no recurrence off steroids   . RHINOSINUSITIS, ACUTE 01/29/2010   Qualifier: Diagnosis of  By: Wynona Luna   . SHOULDER PAIN, LEFT 11/14/2008   Qualifier: Diagnosis of  By: Wynona Luna   . Sinusitis 11/26/2010  . Sleep apnea    on cpap  . Thrombocytopenia ([place])     Tobacco History: Social History   Tobacco Use  Smoking Status Former Smoker  . Packs/day: 1.00  . Years: 2.00  . Pack years: 2.00  . Types: Cigarettes, Cigars  . Quit date: 04/17/1986  . Years since quitting: 34.3  Smokeless Tobacco Never Used   Counseling given: Not Answered   Outpatient Medications Prior to Visit  Medication Sig Dispense Refill  . albuterol (VENTOLIN HFA) 108 (90 Base) MCG/ACT inhaler INHALE 2 PUFFS BY MOUTH FOUR TIMES A DAY FOR LUNG IRRITATION, BREATHING, LUNG SCARRING, MILD ASTHMA.  TAKE 2  PUFFS  WHILE STANDING UP WHEN NEEDED UP TO 4 TIMES A DAY. FOR LUNG IRRITATION, BREATHING, LUNG SCARRING, MILD ASTHMA.  TAKE 2 PUFFS   WHILE STANDING UP WHEN NEEDED UP TO 4 TIMES A DAY.    Marland Kitchen Cholecalciferol 50 MCG (2000 UT) TABS TAKE ONE TABLET BY MOUTH DAILY FOR LOW VITAMIN D    . fluticasone (FLONASE) 50 MCG/ACT nasal spray INSTILL 2 SPRAYS IN EACH NOSTRIL DAILY FOR ALLERGIC RHINITIS, ALLERGIES, NASAL CONGESTION.    Marland Kitchen Ibuprofen 200 MG CAPS Take 600 mg by mouth as needed.     . rosuvastatin (CRESTOR) 20 MG tablet TAKE 1 TABLET BY MOUTH EVERY DAY 90 tablet 3  . doxycycline (VIBRA-TABS) 100 MG tablet Take 1 tablet (100 mg total) by mouth 2 (two) times daily. 14 tablet 0   No facility-administered  medications prior to visit.     Review of Systems:   Constitutional:   No  weight loss, night sweats,  Fevers, chills, fatigue, or  lassitude.  HEENT:   No headaches,  Difficulty swallowing,  Tooth/dental problems, or  Sore throat,                No sneezing, itching, ear ache,  +nasal congestion, post nasal drip,   CV:  No chest pain,  Orthopnea, PND, swelling in lower extremities, anasarca, dizziness, palpitations, syncope.   GI  No heartburn, indigestion, abdominal pain, nausea, vomiting, diarrhea, change in bowel habits, loss of appetite, bloody stools.   Resp:  No chest wall deformity  Skin: no rash or lesions.  GU: no dysuria, change in color of urine, no urgency or frequency.  No flank pain, no hematuria   MS:  No joint pain or swelling.  No decreased range of motion.  No back pain.    Physical Exam  BP 120/68 (BP Location: Left Arm, Cuff Size: Normal)   Pulse 87   Temp 98.5 F (36.9 C) (Temporal)   Ht 6' 2"  (1.88 m)   Wt 269 lb 12.8 oz (122.4 kg)   SpO2 98% Comment: RA  BMI 34.64 kg/m   GEN: A/Ox3; pleasant , NAD, well nourished    HEENT:  [place]/AT,  EACs-clear, TMs-wnl, NOSE-clear, THROAT-clear, no lesions, no postnasal drip or exudate noted.   NECK:  Supple w/ fair ROM;  no JVD; normal carotid impulses w/o bruits; no thyromegaly or nodules palpated; no lymphadenopathy.    RESP  Clear  P & A; w/o, wheezes/ rales/ or rhonchi. no accessory muscle use, no dullness to percussion  CARD:  RRR, no m/r/g, no peripheral edema, pulses intact, no cyanosis or clubbing.  GI:   Soft & nt; nml bowel sounds; no organomegaly or masses detected.   Musco: Warm bil, no deformities or joint swelling noted.   Neuro: alert, no focal deficits noted.    Skin: Warm, no lesions or rashes    Lab Results:  CBC    BNP No results found for: BNP  ProBNP  Imaging: No results found.    PFT Results Latest Ref Rng & Units 11/06/2015 02/21/2015  FVC-Pre L 2.80 2.89  FVC-Predicted Pre % 66 66  FVC-Post L 2.68 2.77  FVC-Predicted Post % 63 63  Pre FEV1/FVC % % 84 81  Post FEV1/FCV % % 88 88  FEV1-Pre L 2.35 2.35  FEV1-Predicted Pre % 73 69  FEV1-Post L 2.36 2.43  DLCO uncorrected ml/min/mmHg 18.18 20.98  DLCO UNC% % 51 57  DLVA Predicted % 100 113  TLC L 4.22 4.05  TLC % Predicted % 56 53  RV % Predicted % 54 48    No results found for: NITRICOXIDE      Assessment & Plan:   Acute bronchitis Slow to resolve flare , medication side effect with Doxycycline .  Will change to Zithromax  Add low dose steroid burst.  Check chest xray   Plan  Patient Instructions  Stop Doxycycline .  Begin Zpack take as directed  Mucinex DM Twice daily  As needed  Cough/congestion  Tessalon Three times a day  As needed  Cough  Prednisone 67m daily for 5 days  Chest xray today  Continue on CPAP At bedtime   Follow up with Dr. SHalford Chessman In 3-4 months and As needed   Please contact office for sooner follow up if symptoms do not improve or  worsen or seek emergency care        OSA (obstructive sleep apnea) Cont on CPAP At bedtime       Rexene Edison, NP 08/06/2020

## 2020-08-06 NOTE — Progress Notes (Signed)
Reviewed and agree with assessment/plan.   Shaleena Crusoe, MD [facility] 08/06/2020, 3:13 PM Pager:  [phone]  

## 2020-08-06 NOTE — Assessment & Plan Note (Signed)
Slow to resolve flare , medication side effect with Doxycycline .  Will change to Zithromax  Add low dose steroid burst.  Check chest xray   Plan  Patient Instructions  Stop Doxycycline .  Begin Zpack take as directed  Mucinex DM Twice daily  As needed  Cough/congestion  Tessalon Three times a day  As needed  Cough  Prednisone 20mg  daily for 5 days  Chest xray today  Continue on CPAP At bedtime   Follow up with Dr. Halford Chessman  In 3-4 months and As needed   Please contact office for sooner follow up if symptoms do not improve or worsen or seek emergency care

## 2020-08-17 DIAGNOSIS — J209 Acute bronchitis, unspecified: Secondary | ICD-10-CM | POA: Diagnosis not present

## 2020-08-20 ENCOUNTER — Telehealth: Payer: Self-pay | Admitting: Internal Medicine

## 2020-08-20 NOTE — Telephone Encounter (Signed)
Hard to say if he could or should not go. Can he come in for another evaluation this week? Can he also do spiro/dlco this week?

## 2020-08-20 NOTE — Telephone Encounter (Signed)
Spoke with and scheduled for Spiro/ DLCO on Thursday 08/23/20.

## 2020-08-20 NOTE — Telephone Encounter (Signed)
pls schedule visit with app or me this week but after the pft

## 2020-08-20 NOTE — Telephone Encounter (Signed)
Spoke with pt and scheduled him with Benjamine Mola on Friday at [place]. Nothing further needed at this time.

## 2020-08-20 NOTE — Telephone Encounter (Signed)
Patient seen TP on 08/06/2020 and was prescribed Zithromax and prednisone. He had some improvement with treatment. He went to UC on 08/17/20 and was prescribe amoxicillin and prednisone. He will complete course pf prednisone on wednesday and amoxicillin on Friday.  He reports of prod cough with dark brown to white sputum and wheezing and increased sob with exertion. Sx have been present for 3 weeks. Cough and wheezing worsens at night.  He is concerned about PNA. He is using albuterol TID, flonase BID and mucinex Q4H.  Fully vaccinated against covid and flu.  He has plans to go to [place] next week for fishing trip. He is questioning if he could cancel this trip, as it is typically cooler there.  Dr. Chase Caller,  Please advise. Thanks

## 2020-08-21 ENCOUNTER — Other Ambulatory Visit (HOSPITAL_COMMUNITY)
Admission: RE | Admit: 2020-08-21 | Discharge: 2020-08-21 | Disposition: A | Discharge status: Home / Self Care | Payer: Medicare Other | Source: Ambulatory Visit | Attending: Primary Care | Admitting: Primary Care

## 2020-08-21 DIAGNOSIS — Z20822 Contact with and (suspected) exposure to covid-19: Secondary | ICD-10-CM | POA: Insufficient documentation

## 2020-08-21 DIAGNOSIS — Z01812 Encounter for preprocedural laboratory examination: Secondary | ICD-10-CM | POA: Diagnosis not present

## 2020-08-21 LAB — SARS CORONAVIRUS 2 (TAT 6-24 HRS): SARS Coronavirus 2: NEGATIVE

## 2020-08-22 ENCOUNTER — Other Ambulatory Visit: Payer: Self-pay | Admitting: Internal Medicine

## 2020-08-22 DIAGNOSIS — J479 Bronchiectasis, uncomplicated: Secondary | ICD-10-CM

## 2020-08-23 ENCOUNTER — Ambulatory Visit (INDEPENDENT_AMBULATORY_CARE_PROVIDER_SITE_OTHER): Payer: Medicare Other | Admitting: Internal Medicine

## 2020-08-23 ENCOUNTER — Other Ambulatory Visit: Payer: Self-pay

## 2020-08-23 DIAGNOSIS — J479 Bronchiectasis, uncomplicated: Secondary | ICD-10-CM

## 2020-08-23 LAB — PULMONARY FUNCTION TEST
DL/VA % pred: 132 %
DL/VA: 5.29 ml/min/mmHg/L
DLCO cor % pred: 78 %
DLCO cor: 21.53 ml/min/mmHg
DLCO unc % pred: 78 %
DLCO unc: 21.53 ml/min/mmHg
FEF 25-75 Pre: 2.31 L/sec
FEF2575-%Pred-Pre: 87 %
FEV1-%Pred-Pre: 78 %
FEV1-Pre: 2.43 L
FEV1FVC-%Pred-Pre: 104 %
FEV6-%Pred-Pre: 77 %
FEV6-Pre: 3.06 L
FEV6FVC-%Pred-Pre: 104 %
FVC-%Pred-Pre: 74 %
FVC-Pre: 3.06 L
Pre FEV1/FVC ratio: 79 %
Pre FEV6/FVC Ratio: 100 %

## 2020-08-23 NOTE — Progress Notes (Signed)
_0  ID: Lolly Mustache, male    DOB: 10/27/1950, 70 y.o.   MRN: [id number]  Chief Complaint  Patient presents with  . Follow-up    Wheezing and cough when lying down    Referring provider: Isaac Bliss, [place]*  HPI: 70 year old male former smoker followed for obstructive sleep apnea and bronchiectasis, Tracheobronchomalacia, hx COP/BOOP illness in 2013. Medical history significant for diastolic dysfunction. Patient of Dr. Chase Caller, last seen by pulmonary NP on 08/06/20. He also follows with Dr. Halford Chessman for sleep apnea.   Previous [facility] pulmonary encounter:  08/06/2020 Follow up : Bronchiectasis  Patient presents for a follow-up visit.  Patient complains of 1 week of nasal congestion sinus pressure and drainage.  With increased cough.  Has had some intermittent wheezing.  Patient called into the office on Aug 02, 2020 and was called in  doxycycline for 7 days for acute URI/bronchitis. .  Patient is on day 5 of 7. He complains he can not tolerate, makes him sick on his stomach . Caused him to throw up several times. . Was up coughing a lot last night .  No hemoptysis , chest pain , orthopnea or edema.  Did not take antibiotic this am, worried he would get sick again.   Remains on CPAP At bedtime  . Doing well . Wears each night for about 7 hrs    08/24/2020- Interim hx Patient presents today for PFTs review/acute sick visit. During his last visit with TP on 08/06/20 Doxycycline was changed to azithromycin and prednisone 31m x 5 days for prolonged bronchitis symptoms. He called our office on 08/20/20 with reports of worsening cough. He went to UC on 08/17/20 and was prescribed amoxicillin and prednisone. He reports symptoms of productive cough with dark brown-white sputus, wheezing and increased shortness of breath with exertion. Symptoms have been present for 3 weeks. Cough and wheezing are worse at night. He is maintained on Ventolin hfa prn, tessalon perles and flonase nasal spray.  He has  severe restriction with moderate diffusion defect on PFTs in 2017. Imaging 06/08/18 showed stable mild cylindrical bronchiectasis in the lower lobes. No findings of interstitial lug diease. New tracheobronchomalacia. He is on CPAP therapy at 6cm h20. DME company is [employer].     TEST/EVENTS :   Labs April 2009 >> ESR 115, RF 24, ANA negative  Labs September 2013 >> ANCA, SCL 70, HP panel, anti DS DNA, RF, ACE >> negative  VATS bx 01/05/12 >> chronic interstitial pneumonia with organizing pneumonia and hemorrhagic infarct >> NSIP versus viral mediated  PFT 12/30/12 >> FEV1 2.33 (68%), FEV1% 89, TLC 3.25 (42%), DLCO 44%, no BD  Chest Imaging:   HRCT chest 06/08/18 >> atherosclerosis, 4 mm nodule LUL, mild cylindrical BTX b/l lower lobes, mild tracheobronchomalacia  Sleep Tests:   CPAP 09/29/18 >> CPAP 8 cm H2O >> AHI 0, +R, +S.  CPAP 10/29/19 to 11/27/19 >> used on 29 of 30 nights with average 4 hrs 59 min.  Average AHI 0.4 with CPAP 8 cm H2O  Cardiac Tests:   Echo 01/26/18 >> EF 60 to 65%, grade 2 DD, mild AR  CPET 06/20/19 >> deconditioning, restriction, chronotropic incompetence   Allergies  Allergen Reactions  . Ivp Dye [Iodinated Diagnostic Agents] Anaphylaxis    Coma for a day in '09  . Doxycycline Nausea Only  . Lactose Intolerance (Gi) Other (See Comments)    Immunization History  Administered Date(s) Administered  . Fluad Quad(high Dose 65+) 01/18/2019, 02/29/2020  . Influenza  Split 01/02/2011, 03/08/2012  . Influenza Whole 01/29/2010  . Influenza, High Dose Seasonal PF 12/27/2015, 12/30/2016, 03/26/2018  . Influenza,inj,Quad PF,6+ Mos 12/28/2012, 01/25/2014, 01/25/2015  . Influenza-Unspecified 12/30/2019  . PFIZER(Purple Top)SARS-COV-2 Vaccination 05/05/2019, 05/26/2019, 02/14/2020  . Pneumococcal Conjugate-13 05/09/2015  . Pneumococcal Polysaccharide-23 03/21/2008, 05/01/2016  . Td 11/15/2002    Past Medical History:  Diagnosis Date  . Anemia    'YEARS  AGO"  . Arthritis   . ASTHMA 02/11/2007       . CARBUNCLE AND FURUNCLE OF LEG EXCEPT FOOT 08/14/2009   Qualifier: Diagnosis of  By: Wynona Luna   . Carotid stenosis, asymptomatic 01/19/2018   1-39% on carotid Doppler 2015.   Marland Kitchen CARPAL TUNNEL SYNDROME, LEFT 02/15/2009   Qualifier: Diagnosis of  By: Wynona Luna   . Chronic interstitial lung disease ([place])    PULMONARY INFILTRATE  --  PULMOLOGIST-  DR CLANCE  . Chronic steroid use    INTERSTITIAL LUNG DISEASE  . COLONIC POLYPS, HX OF 12/23/2006   Qualifier: Diagnosis of  By: Marca Ancona RMA, Lucy    . Complication of anesthesia    EMERGENCE COMBATIVENESS---  PLEASE REFER TO VATS 01-05-1012 PROCEDURE ,  DR Linna Caprice DOCUMENTED GRADE IV DIFFICULT VISUAL AIRWAY  . DIABETES MELLITUS, TYPE II, BORDERLINE 11/14/2008   Qualifier: Diagnosis of  By: Wynona Luna   . Dyspnea on exertion   . Elevated PSA 08/13/2011  . GERD 12/23/2006   Qualifier: Diagnosis of  By: Marca Ancona RMA, Lucy    . History of anal fissures   . History of hypothyroidism   . Hyperlipidemia 05/01/2016  . Hypothyroidism 12/23/2006   Qualifier: Diagnosis of  By: Marca Ancona RMA, Lucy    . MEDIASTINAL LYMPHADENOPATHY 08/30/2007   Qualifier: Diagnosis of  By: Joya Gaskins MD, Burnett Harry   . Mild ascending aorta dilatation ([place]) 01/19/2018  . Oral thrush 04/22/2012  . OSA (obstructive sleep apnea) 12/23/2006   Pt prefers to stay on auto setting.    . Pneumonia 11/13/2011  . Prostate cancer ([place]) 06/09/12 bx   Adenocarcinoma  . PULMONARY EDEMA 07/05/2007   Qualifier: Diagnosis of  By: Joya Gaskins MD, Burnett Harry   . Pulmonary infiltrate 12/23/2011   H/o LN, pericardial effusion, pleural effusion unknown origin 2009.  ?resolved with prednisone?? Mediastinoscopy 2009:  Benign lymphoid hyperplasia Ct chest 11/2011: scattered GGO, interstitial changes, small pulmonary nodules, mild LN No response to prolonged course of abx. Autoimmune w/u:  ESR 78, but ACE/RF/DSDNA/CCP/ENA/SCL70/HP panel/ANCA all neg.  ANA low  positive (prob neg). VATS bx 12/2011:  CIP with organizing pna.  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  V/Q 01/2012:  Ok +++ response to steroids (started 12/2011) PFT's 12/2012:  No obstruction, TLC 3.25 (42%), DLCO 44% Pulmonary rehab 2014 xrays and symptoms greatly improved with steroids, and no recurrence off steroids   . RHINOSINUSITIS, ACUTE 01/29/2010   Qualifier: Diagnosis of  By: Wynona Luna   . SHOULDER PAIN, LEFT 11/14/2008   Qualifier: Diagnosis of  By: Wynona Luna   . Sinusitis 11/26/2010  . Sleep apnea    on cpap  . Thrombocytopenia ([place])     Tobacco History: Social History   Tobacco Use  Smoking Status Former Smoker  . Packs/day: 1.00  . Years: 2.00  . Pack years: 2.00  . Types: Cigarettes, Cigars  . Quit date: 04/17/1986  . Years since quitting: 34.3  Smokeless Tobacco Never Used   Counseling given: Not Answered   Outpatient  Medications Prior to Visit  Medication Sig Dispense Refill  . albuterol (VENTOLIN HFA) 108 (90 Base) MCG/ACT inhaler INHALE 2 PUFFS BY MOUTH FOUR TIMES A DAY FOR LUNG IRRITATION, BREATHING, LUNG SCARRING, MILD ASTHMA.  TAKE 2 PUFFS  WHILE STANDING UP WHEN NEEDED UP TO 4 TIMES A DAY. FOR LUNG IRRITATION, BREATHING, LUNG SCARRING, MILD ASTHMA.  TAKE 2 PUFFS   WHILE STANDING UP WHEN NEEDED UP TO 4 TIMES A DAY.    . benzonatate (TESSALON) 200 MG capsule Take 1 capsule (200 mg total) by mouth 3 (three) times daily as needed for cough. 30 capsule 1  . Cholecalciferol 50 MCG (2000 UT) TABS TAKE ONE TABLET BY MOUTH DAILY FOR LOW VITAMIN D    . fluticasone (FLONASE) 50 MCG/ACT nasal spray INSTILL 2 SPRAYS IN EACH NOSTRIL DAILY FOR ALLERGIC RHINITIS, ALLERGIES, NASAL CONGESTION.    Marland Kitchen Ibuprofen 200 MG CAPS Take 600 mg by mouth as needed.     . predniSONE (DELTASONE) 20 MG tablet Take 1 tablet (20 mg total) by mouth daily with breakfast. 5 tablet 0  . rosuvastatin (CRESTOR) 20 MG tablet TAKE 1 TABLET BY MOUTH EVERY DAY 90 tablet 3   No  facility-administered medications prior to visit.    Review of Systems  Review of Systems  Constitutional: Negative.   HENT: Positive for congestion and postnasal drip.   Respiratory: Positive for cough and wheezing.   Cardiovascular: Negative.    Physical Exam  BP 120/74 (BP Location: Left Arm, Cuff Size: Normal)   Pulse 85   Temp 97.8 F (36.6 C) (Temporal)   Ht _0  (1.88 m)   Wt 265 lb 12.8 oz (120.6 kg)   SpO2 99% Comment: RA  BMI 34.13 kg/m  Physical Exam Constitutional:      Appearance: Normal appearance.  HENT:     Head: Normocephalic and atraumatic.  Cardiovascular:     Rate and Rhythm: Normal rate and regular rhythm.  Pulmonary:     Breath sounds: Rales present.     Comments: Scant rales bilateral bases  Musculoskeletal:        General: Normal range of motion.  Skin:    General: Skin is warm and dry.  Neurological:     General: No focal deficit present.     Mental Status: He is alert and oriented to person, place, and time. Mental status is at baseline.  Psychiatric:        Mood and Affect: Mood normal.        Behavior: Behavior normal.        Thought Content: Thought content normal.        Judgment: Judgment normal.      Lab Results:  CBC    Component Value Date/Time   WBC 6.3 06/29/2019 0730   RBC 4.28 06/29/2019 0730   HGB 12.9 (L) 06/29/2019 0730   HCT 38.5 (L) 06/29/2019 0730   PLT 139.0 (L) 06/29/2019 0730   MCV 89.9 06/29/2019 0730   MCH 30.4 12/16/2012 0922   MCHC 33.4 06/29/2019 0730   RDW 15.1 06/29/2019 0730   LYMPHSABS 1.9 06/29/2019 0730   MONOABS 0.5 06/29/2019 0730   EOSABS 0.3 06/29/2019 0730   BASOSABS 0.0 06/29/2019 0730    BMET    Component Value Date/Time   NA 139 06/29/2019 0730   NA 140 09/13/2018 0929   K 4.1 06/29/2019 0730   CL 104 06/29/2019 0730   CO2 28 06/29/2019 0730   GLUCOSE 82 06/29/2019 0730  BUN 13 06/29/2019 0730   BUN 12 09/13/2018 0929   CREATININE 0.74 06/29/2019 0730   CALCIUM 8.9  06/29/2019 0730   GFRNONAA 97 09/13/2018 0929   GFRAA 113 09/13/2018 0929    BNP No results found for: BNP  ProBNP    Component Value Date/Time   PROBNP 10.0 12/11/2011 1707    Imaging: DG Chest 2 View  Result Date: 08/07/2020 CLINICAL DATA:  Cough and congestion. EXAM: CHEST - 2 VIEW COMPARISON:  PA and lateral chest 06/06/2014.  CT chest 06/08/2018. FINDINGS: Lung volumes are low with some crowding of bronchovascular structures. No consolidative process, pneumothorax or effusion. Heart size is normal. No acute bony abnormality. IMPRESSION: No acute disease. Electronically Signed   By: Inge Rise M.D.   On: 08/07/2020 13:50     Assessment & Plan:   Acute bronchitis - Continues to have persistent cough with clear mucus. Cough/wheezing are worse and night. He is Afebrile. He has been treated with azithromycin, Augmentin and several prednisone tapers. His cough does improve with oral steriods. PFTs yesterday showed mild restriction, DLCOunc 78% predicted. CXR on 08/06/20 showed no acute abnormality.  We will check sputum sample. Recommend trial Breo 100 x 2 weeks. Continue flonase nasal spray, add flutter valve and chlorpheniramine 62m tablet q 4 hours for cough/allergy symptoms. If cough improved on low dose ICS/LABA will send in RX. If no improvement would trial PPI and consider repeating HRCT. He will follow-up in 4 weeks with Dr. RChase Caller       EMartyn Ehrich NP 08/24/2020

## 2020-08-23 NOTE — Progress Notes (Signed)
Spirometry and dlco done today. 

## 2020-08-24 ENCOUNTER — Encounter: Payer: Self-pay | Admitting: Primary Care

## 2020-08-24 ENCOUNTER — Ambulatory Visit (INDEPENDENT_AMBULATORY_CARE_PROVIDER_SITE_OTHER): Payer: Medicare Other | Admitting: Primary Care

## 2020-08-24 ENCOUNTER — Telehealth: Payer: Self-pay | Admitting: Internal Medicine

## 2020-08-24 VITALS — BP 120/74 | HR 85 | Temp 97.8°F | Ht 74.0 in | Wt 265.8 lb

## 2020-08-24 DIAGNOSIS — J209 Acute bronchitis, unspecified: Secondary | ICD-10-CM | POA: Diagnosis not present

## 2020-08-24 DIAGNOSIS — J479 Bronchiectasis, uncomplicated: Secondary | ICD-10-CM | POA: Diagnosis not present

## 2020-08-24 MED ORDER — BREO ELLIPTA 100-25 MCG/INH IN AEPB: 1.0000 | INHALATION_SPRAY | Freq: Every day | RESPIRATORY_TRACT | 0 refills | Status: DC

## 2020-08-24 NOTE — Patient Instructions (Addendum)
Recommend: - Continue Flonase nasal spray - Continue Tesslon perles three times a day for cough - Please pick up OTC chlorpheniramine tablets- take 4mg  every 4 hours as needed for cough - Trial 2 week sample BREO inhaler- take one puff daily in morning (rinse mouth after use) - If inhaler helps your cough please call us or send mychart message letting us know and we can send in RX  - Use flutter valve three times a day to help loose mucus/cough  - Continue to wear CPAP every night for 4-6 hours or longer   Orders: - Please provide patient with flutter valve  - Respiratory sputum culture if patient is able to cough something up (ordered)  Follow-up: 4 weeks with (Dr. Chase Caller has openings July 1 and July 5th- 15 min slot ok)

## 2020-08-24 NOTE — Assessment & Plan Note (Addendum)
-   Continues to have persistent cough with clear mucus. Cough/wheezing are worse and night. He is Afebrile. He has been treated with azithromycin, Augmentin and several prednisone tapers. His cough does improve with oral steriods. PFTs yesterday showed mild restriction, DLCOunc 78% predicted. CXR on 08/06/20 showed no acute abnormality.  We will check sputum sample. Recommend trial Breo 100 x 2 weeks. Continue flonase nasal spray, add flutter valve and chlorpheniramine 4mg  tablet q 4 hours for cough/allergy symptoms. If cough improved on low dose ICS/LABA will send in RX. If no improvement would trial PPI and consider repeating HRCT. He will follow-up in 4 weeks with Dr. Chase Caller.

## 2020-08-28 ENCOUNTER — Other Ambulatory Visit: Payer: Medicare Other

## 2020-08-28 DIAGNOSIS — J479 Bronchiectasis, uncomplicated: Secondary | ICD-10-CM | POA: Diagnosis not present

## 2020-08-31 ENCOUNTER — Other Ambulatory Visit: Payer: Self-pay | Admitting: Primary Care

## 2020-08-31 LAB — RESPIRATORY CULTURE OR RESPIRATORY AND SPUTUM CULTURE
MICRO NUMBER:: 11949940
SPECIMEN QUALITY:: ADEQUATE

## 2020-08-31 MED ORDER — LEVOFLOXACIN 500 MG PO TABS: 500.0000 mg | ORAL_TABLET | Freq: Every day | ORAL | 0 refills | Status: DC

## 2020-08-31 NOTE — Progress Notes (Signed)
Please let patient know sputum culture grew a bug called pseudomonas. Please send in Levaquin 500mg  x 7 days

## 2020-09-06 NOTE — Telephone Encounter (Signed)
Nothing noted in message. Will close encounter.  

## 2020-09-24 ENCOUNTER — Encounter (HOSPITAL_BASED_OUTPATIENT_CLINIC_OR_DEPARTMENT_OTHER): Payer: Self-pay

## 2020-09-24 ENCOUNTER — Ambulatory Visit: Payer: Medicare Other | Admitting: Pulmonary Disease

## 2020-09-24 DIAGNOSIS — Z5181 Encounter for therapeutic drug level monitoring: Secondary | ICD-10-CM

## 2020-09-24 DIAGNOSIS — U071 COVID-19: Secondary | ICD-10-CM | POA: Diagnosis not present

## 2020-09-24 DIAGNOSIS — R051 Acute cough: Secondary | ICD-10-CM | POA: Diagnosis not present

## 2020-09-24 DIAGNOSIS — E785 Hyperlipidemia, unspecified: Secondary | ICD-10-CM

## 2020-10-02 ENCOUNTER — Ambulatory Visit: Payer: Medicare Other | Admitting: Internal Medicine

## 2020-10-26 ENCOUNTER — Telehealth (HOSPITAL_BASED_OUTPATIENT_CLINIC_OR_DEPARTMENT_OTHER): Payer: Self-pay | Admitting: Cardiovascular Disease

## 2020-10-26 DIAGNOSIS — E785 Hyperlipidemia, unspecified: Secondary | ICD-10-CM

## 2020-10-26 DIAGNOSIS — Z5181 Encounter for therapeutic drug level monitoring: Secondary | ICD-10-CM

## 2020-10-26 NOTE — Telephone Encounter (Signed)
Wife of the patient called. The patient needs to have lab orders done prior to his appt in October. Please mail lab slips to the patient's home

## 2020-10-26 NOTE — Telephone Encounter (Signed)
Mailed lab orders for LP/CMET, wife aware

## 2020-11-02 ENCOUNTER — Telehealth: Payer: Self-pay

## 2020-11-02 NOTE — Telephone Encounter (Signed)
Called and spoke with pt, asked if patient could bring his SD card to his upcoming appointment, pt states he will. Nothing further needed.

## 2020-11-05 ENCOUNTER — Ambulatory Visit (INDEPENDENT_AMBULATORY_CARE_PROVIDER_SITE_OTHER): Payer: Medicare Other | Admitting: Pulmonary Disease

## 2020-11-05 ENCOUNTER — Encounter: Payer: Self-pay | Admitting: Pulmonary Disease

## 2020-11-05 ENCOUNTER — Other Ambulatory Visit: Payer: Self-pay

## 2020-11-05 VITALS — BP 122/72 | HR 82 | Temp 98.1°F | Ht 74.0 in | Wt 267.0 lb

## 2020-11-05 DIAGNOSIS — Z9989 Dependence on other enabling machines and devices: Secondary | ICD-10-CM

## 2020-11-05 DIAGNOSIS — J310 Chronic rhinitis: Secondary | ICD-10-CM

## 2020-11-05 DIAGNOSIS — G4733 Obstructive sleep apnea (adult) (pediatric): Secondary | ICD-10-CM | POA: Diagnosis not present

## 2020-11-05 NOTE — Addendum Note (Signed)
Addended by: Luanna Salk on: 11/05/2020 12:00 PM   Modules accepted: Orders

## 2020-11-05 NOTE — Progress Notes (Signed)
[name]  Chief Complaint  Patient presents with   Follow-up    OSA    Constitutional:  BP 122/72 (BP Location: Left Arm, Cuff Size: Normal)   Pulse 82   Temp 98.1 F (36.7 C) (Oral)   Ht 6' 2"  (1.88 m)   Wt 267 lb (121.1 kg)   SpO2 99%   BMI 34.28 kg/m   Past Medical History:  Prostate cancer, Hypothyroidism, Pulmonary infiltrates 2013 s/p VATs lung bx  Past Surgical History:  His  has a past surgical history that includes Mediastinoscopy (07-12-2007); Nasal sinus surgery; prostate biopsy (06/11/12); Video assisted thoracoscopy (vats)/ lymph node sampling (Left, 01-05-2012); Cardiovascular stress test (01-10-2008); transthoracic echocardiogram (12-31-2011); Radioactive seed implant (N/A, 12/23/2012); Colonoscopy (01-15-2009); Lung biopsy; and Polypectomy.  Brief Summary:  Chad [name] is a 70 y.o. male former smoker with obstructive sleep apnea and bronchiectasis.      Subjective:   Has nasal pillows.  Gets sinus congestion, drainage and cough in the morning.  Pressure okay.  Still hasn't received new machine.  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 CPAP 10/06/20 to 11/04/20 >> used on 30 of 30 nights with average 6 hrs 12 min.  Average AHI 0.5 with CPAP 8 cm H2O  Cardiac Tests:  Echo 01/26/18 >> EF 60 to 65%, grade 2 DD, mild AR CPET 06/20/19 >> deconditioning, restriction, chronotropic incompetence  Social History:  He  reports that he quit smoking about 34 years ago. His smoking use included cigarettes and cigars. He has a 2.00 pack-year smoking history. He has never used smokeless tobacco. He reports current alcohol use of about 2.0 standard drinks of alcohol per week. He reports that he does not use drugs.  Family History:  His family history includes Alzheimer's disease in his maternal grandmother and mother; Bradycardia in his father; Cancer in his paternal uncle; Colon cancer in his father; Diabetes in his daughter; Heart attack in his father; Hyperlipidemia in his sister; Hypertension in his mother; Nephrolithiasis in his father; Prostate cancer in his father; Sinusitis in his brother.     Assessment/Plan:   Obstructive sleep apnea. - he is compliant with CPAP and reports benefit from therapy - uses [employer] for his DME - will check on status of his new CPAP machine - continue CPAP 8 cm H2O  CPAP rhinitis. - nasal irrigation and flonase - increase humidity with CPAP - can add OTC astepro if not improved  Tracheobronchomalacia. Bronchiectasis. - f/u with  Dr. Chase Caller  Time Spent Involved in Patient Care on Day of Examination:  22 minutes  Follow up:   Patient Instructions  Will have our patient care coordinators check on the status of you getting a new CPAP machine that was ordered in December 2021  Try using saline nasal spray and flonase nightly  Can try increasing humidifier setting on your CPAP machine to help with sinus congestion  If symptoms of sinus congestion persist, then you can  try getting astepro antihistamine nasal spray over the counter and use twice per day  Follow up in 6 months  Medication List:   Allergies as of 11/05/2020       Reactions   Ivp Dye [iodinated Diagnostic Agents] Anaphylaxis   Coma for a day in '09   Doxycycline Nausea Only   Lactose Intolerance (gi) Other (See Comments)        Medication List        Accurate as of November 05, 2020 11:48 AM. If you have any questions, ask your nurse or doctor.          STOP taking these medications    benzonatate 200 MG capsule Commonly known as: TESSALON Stopped by: Chad Mires, MD   Breo Ellipta 100-25 MCG/INH Aepb Generic drug: fluticasone furoate-vilanterol Stopped by: Chad Mires, MD   levofloxacin 500 MG tablet Commonly known as: LEVAQUIN Stopped by: Chad Mires, MD   predniSONE 20 MG tablet Commonly known as: DELTASONE Stopped by: Chad Mires, MD       TAKE these medications    albuterol 108 (90 Base) MCG/ACT inhaler Commonly known as: VENTOLIN HFA INHALE 2 PUFFS BY MOUTH FOUR TIMES A DAY FOR LUNG IRRITATION, BREATHING, LUNG SCARRING, MILD ASTHMA.  TAKE 2 PUFFS  WHILE STANDING UP WHEN NEEDED UP TO 4 TIMES A DAY. FOR LUNG IRRITATION, BREATHING, LUNG SCARRING, MILD ASTHMA.  TAKE 2 PUFFS   WHILE STANDING UP WHEN NEEDED UP TO 4 TIMES A DAY.   Cholecalciferol 50 MCG (2000 UT) Tabs TAKE ONE TABLET BY MOUTH DAILY FOR LOW VITAMIN D   fluticasone 50 MCG/ACT nasal spray Commonly known as: FLONASE INSTILL 2 SPRAYS IN EACH NOSTRIL DAILY FOR ALLERGIC RHINITIS, ALLERGIES, NASAL CONGESTION.   Ibuprofen 200 MG Caps Take 600 mg by mouth as needed.   rosuvastatin 20 MG tablet Commonly known as: CRESTOR TAKE 1 TABLET BY MOUTH EVERY DAY        Signature:  Chad Mires, MD Chad [name] Pager - [phone] 11/05/2020, 11:48 AM

## 2020-11-05 NOTE — Patient Instructions (Addendum)
Will have our patient care coordinators check on the status of you getting a new CPAP machine that was ordered in December 2021  Try using saline nasal spray and flonase nightly  Can try increasing humidifier setting on your CPAP machine to help with sinus congestion  If symptoms of sinus congestion persist, then you can try getting astepro antihistamine nasal spray over the counter and use twice per day  Follow up in 6 months

## 2020-11-15 ENCOUNTER — Encounter: Payer: Self-pay | Admitting: Internal Medicine

## 2020-11-15 ENCOUNTER — Ambulatory Visit (INDEPENDENT_AMBULATORY_CARE_PROVIDER_SITE_OTHER): Payer: Medicare Other | Admitting: Internal Medicine

## 2020-11-15 ENCOUNTER — Other Ambulatory Visit: Payer: Self-pay

## 2020-11-15 VITALS — BP 118/74 | HR 75 | Temp 98.3°F | Ht 74.0 in | Wt 264.0 lb

## 2020-11-15 DIAGNOSIS — J9809 Other diseases of bronchus, not elsewhere classified: Secondary | ICD-10-CM | POA: Diagnosis not present

## 2020-11-15 DIAGNOSIS — J479 Bronchiectasis, uncomplicated: Secondary | ICD-10-CM
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
## 2020-11-15 NOTE — Patient Instructions (Addendum)
Bronchiectasis without complication ([place])  - this is due to sequelae from  lung inflammation  from 2013 - last CT aug 2017 -> repeat CT March 2020 - > just mild without change but you do have new - tracheobronchomalacia in March 2020 scan that can be contributing to shortness of breath - overall stable since last seeing Dr Chase Caller in 2021   Plan  - do another HRCT chest supine and prone - if ok discharge from ILD clinic   Shortness of breath   -This is likely multifactorial and related to weight, physical conditioning, above bronchiectasis, sleep apnea and also stiff heart muscle called diastolic dysfunction seen on echocardiogram in 2019  - CPST test 2021 indicates primary drver is your lung issues - which is a sequelae frpm your 2013 COP/BOOP illnes  -The symptom seems to be affecting her quality of life significantly  PLAN  -recommend weight loss - goal 195# (currenty 264#)  -  no further followup ILD clinic or Dr Chase Caller  History of obstructive sleep apnea  -CPAP per Dr Halford Chessman    Followup - will call with CT results -with Dr Halford Chessman per his plan

## 2020-11-15 NOTE — Addendum Note (Signed)
Addended by: Lorretta Harp on: 11/15/2020 02:38 PM   Modules accepted: Orders

## 2020-11-23 MED ORDER — ATORVASTATIN CALCIUM 20 MG PO TABS: 20.0000 mg | ORAL_TABLET | Freq: Every day | ORAL | 3 refills | Status: DC

## 2020-12-05 ENCOUNTER — Ambulatory Visit
Admission: RE | Admit: 2020-12-05 | Discharge: 2020-12-05 | Disposition: A | Discharge status: Home / Self Care | Payer: Medicare Other | Source: Ambulatory Visit | Attending: Internal Medicine | Admitting: Internal Medicine

## 2020-12-05 ENCOUNTER — Other Ambulatory Visit: Payer: Self-pay

## 2020-12-05 DIAGNOSIS — I700 Atherosclerosis of aorta: Secondary | ICD-10-CM | POA: Diagnosis not present

## 2020-12-05 DIAGNOSIS — J479 Bronchiectasis, uncomplicated: Secondary | ICD-10-CM | POA: Diagnosis not present

## 2020-12-06 DIAGNOSIS — Z5181 Encounter for therapeutic drug level monitoring: Secondary | ICD-10-CM | POA: Diagnosis not present

## 2020-12-06 DIAGNOSIS — E785 Hyperlipidemia, unspecified: Secondary | ICD-10-CM | POA: Diagnosis not present

## 2020-12-07 LAB — LIPID PANEL
Chol/HDL Ratio: 3.5 ratio (ref 0.0–5.0)
Cholesterol, Total: 198 mg/dL (ref 100–199)
HDL: 57 mg/dL (ref >39)
LDL Chol Calc (NIH): 129 mg/dL — ABNORMAL HIGH (ref 0–99)
Triglycerides: 68 mg/dL (ref 0–149)
VLDL Cholesterol Cal: 12 mg/dL (ref 5–40)

## 2020-12-07 LAB — COMPREHENSIVE METABOLIC PANEL
ALT: 22 IU/L (ref 0–44)
AST: 29 IU/L (ref 0–40)
Albumin/Globulin Ratio: 1 — ABNORMAL LOW (ref 1.2–2.2)
Albumin: 3.8 g/dL (ref 3.7–4.7)
Alkaline Phosphatase: 66 IU/L (ref 44–121)
BUN/Creatinine Ratio: 13 (ref 10–24)
BUN: 10 mg/dL (ref 8–27)
Bilirubin Total: 0.6 mg/dL (ref 0.0–1.2)
CO2: 25 mmol/L (ref 20–29)
Calcium: 9.2 mg/dL (ref 8.6–10.2)
Chloride: 101 mmol/L (ref 96–106)
Creatinine, Ser: 0.78 mg/dL (ref 0.76–1.27)
Globulin, Total: 3.7 g/dL (ref 1.5–4.5)
Glucose: 93 mg/dL (ref 65–99)
Potassium: 4.9 mmol/L (ref 3.5–5.2)
Sodium: 138 mmol/L (ref 134–144)
Total Protein: 7.5 g/dL (ref 6.0–8.5)
eGFR: 95 mL/min/{1.73_m2} (ref >59)

## 2020-12-10 ENCOUNTER — Telehealth: Payer: Self-pay | Admitting: Internal Medicine

## 2020-12-10 DIAGNOSIS — R0602 Shortness of breath: Secondary | ICD-10-CM

## 2020-12-10 DIAGNOSIS — J479 Bronchiectasis, uncomplicated: Secondary | ICD-10-CM

## 2020-12-11 NOTE — Telephone Encounter (Signed)
Pt calling requesting to know the results of recent HRCT. MR, please advise.

## 2020-12-11 NOTE — Telephone Encounter (Signed)
He just says his bronchiectasis which is the sequela from his previous admission.  To be sure and he is not developing any new interstitial lung diseases -do high-resolution CT chest supine and prone.  Of note, I discussed with Dr. Halford Chessman and he is willing to take over and consolidate his care because patient does not have definitive ILD  There are calcifications in his aortic valve and coronary arteries.  He should talk about this with his primary care physician.  Please also send the report to his PCP Isaac Bliss, Rayford Halsted, MD   IMPRESSION: 1. As with prior studies, the predominant feature on today's examination is mild cylindrical bronchiectasis. Today's study does demonstrate some very mild ground-glass attenuation in the lower lobes of the lungs. This is nonspecific, but the possibility of developing interstitial lung disease is not entirely excluded. Repeat high-resolution chest CT is recommended in 12 months to assess for further temporal changes in the appearance of the lung parenchyma if clinically appropriate. 2. Aortic atherosclerosis, in addition to 2 vessel coronary artery disease. Please assessment for potential risk factor modification, dietary therapy or pharmacologic therapy may be warranted, if clinically indicated. 3. There are calcifications of the aortic valve. Echocardiographic correlation for evaluation of potential valvular dysfunction may be warranted if clinically indicated.   Aortic Atherosclerosis (ICD10-I70.0).     Electronically Signed   By: Vinnie Langton M.D.   On: 12/05/2020 14:39

## 2020-12-12 NOTE — Telephone Encounter (Signed)
Reepeat HRCT in 1 year

## 2020-12-12 NOTE — Telephone Encounter (Signed)
Called and spoke with pt letting him know the results of HRCT and pt verbalized understanding.  MR, since pt did recently have the HRCT 9/7, please advise if the scan should be repeated now or if it should be repeated in 1 year.

## 2020-12-12 NOTE — Telephone Encounter (Signed)
Order has been placed for HRCT to be repeated in 1 year. Nothing further needed.

## 2020-12-13 ENCOUNTER — Telehealth (HOSPITAL_BASED_OUTPATIENT_CLINIC_OR_DEPARTMENT_OTHER): Payer: Self-pay | Admitting: *Deleted

## 2020-12-13 DIAGNOSIS — Z5181 Encounter for therapeutic drug level monitoring: Secondary | ICD-10-CM

## 2020-12-13 DIAGNOSIS — E785 Hyperlipidemia, unspecified: Secondary | ICD-10-CM

## 2020-12-13 MED ORDER — ROSUVASTATIN CALCIUM 20 MG PO TABS: 20.0000 mg | ORAL_TABLET | Freq: Every day | ORAL | 3 refills | Status: DC

## 2020-12-13 NOTE — Telephone Encounter (Signed)
Spoke with patient and he has been off Crestor  It had been discussed that he start Atorvastatin in [employer], patient does not want to do that would like to resume Crestor  Discussed with Dr Oval Linsey and ok for him to resume Crestor 20 mg daily and recheck labs

## 2020-12-13 NOTE — Telephone Encounter (Signed)
-----   Message from Skeet Latch, MD sent at 12/11/2020  7:57 PM EDT ----- Cholesterol levels are not well-controlled.  Normal kidney function, liver function and electrolytes.  Increase rosuvastatin to '40mg'$ .  Repeat lipids/CMP in 2-3 months.

## 2021-01-14 ENCOUNTER — Ambulatory Visit (HOSPITAL_BASED_OUTPATIENT_CLINIC_OR_DEPARTMENT_OTHER): Payer: Medicare Other | Admitting: Cardiovascular Disease

## 2021-01-22 ENCOUNTER — Telehealth: Payer: Self-pay | Admitting: Pulmonary Disease

## 2021-01-22 DIAGNOSIS — Z6836 Body mass index (BMI) 36.0-36.9, adult: Secondary | ICD-10-CM | POA: Diagnosis not present

## 2021-01-22 DIAGNOSIS — G4733 Obstructive sleep apnea (adult) (pediatric): Secondary | ICD-10-CM

## 2021-01-22 DIAGNOSIS — J329 Chronic sinusitis, unspecified: Secondary | ICD-10-CM | POA: Diagnosis not present

## 2021-01-22 DIAGNOSIS — Z9889 Other specified postprocedural states: Secondary | ICD-10-CM | POA: Diagnosis not present

## 2021-01-22 NOTE — Telephone Encounter (Signed)
VS please advise.    Pt stated that his cpap pressure feels like it is too high and keeps him up at night and makes him have bags under his eyes.  Can we adjust his pressure?  thanks

## 2021-01-23 DIAGNOSIS — Z8546 Personal history of malignant neoplasm of prostate: Secondary | ICD-10-CM | POA: Diagnosis not present

## 2021-01-23 DIAGNOSIS — R310 Gross hematuria: Secondary | ICD-10-CM | POA: Diagnosis not present

## 2021-01-23 NOTE — Telephone Encounter (Signed)
Please send order to change CPAP to 6 cm H2O.  He uses [facility] for his DME.

## 2021-01-23 NOTE — Telephone Encounter (Signed)
I called the patient and was unable to leave a message for the patient that I have sent an order to change the settings per Dr. Halford Chessman recommendations.   The order will be sent to [facility] and the patient will change the settings once order is received. Nothing further needed.

## 2021-02-04 DIAGNOSIS — Z23 Encounter for immunization: Secondary | ICD-10-CM | POA: Diagnosis not present

## 2021-02-13 DIAGNOSIS — E785 Hyperlipidemia, unspecified: Secondary | ICD-10-CM | POA: Diagnosis not present

## 2021-02-13 DIAGNOSIS — Z5181 Encounter for therapeutic drug level monitoring: Secondary | ICD-10-CM | POA: Diagnosis not present

## 2021-02-27 ENCOUNTER — Other Ambulatory Visit (HOSPITAL_BASED_OUTPATIENT_CLINIC_OR_DEPARTMENT_OTHER): Payer: Self-pay | Admitting: *Deleted

## 2021-02-27 DIAGNOSIS — E785 Hyperlipidemia, unspecified: Secondary | ICD-10-CM

## 2021-02-27 DIAGNOSIS — Z5181 Encounter for therapeutic drug level monitoring: Secondary | ICD-10-CM

## 2021-02-27 LAB — COMPREHENSIVE METABOLIC PANEL
Calcium: 9.3 mg/dL (ref 8.6–10.2)
Chloride: 102 mmol/L (ref 96–106)
Potassium: 4.3 mmol/L (ref 3.5–5.2)
Sodium: 141 mmol/L (ref 134–144)

## 2021-02-27 LAB — LIPID PANEL

## 2021-03-04 ENCOUNTER — Encounter (HOSPITAL_BASED_OUTPATIENT_CLINIC_OR_DEPARTMENT_OTHER): Payer: Self-pay | Admitting: Cardiovascular Disease

## 2021-03-04 ENCOUNTER — Ambulatory Visit (INDEPENDENT_AMBULATORY_CARE_PROVIDER_SITE_OTHER): Payer: Medicare Other | Admitting: Cardiovascular Disease

## 2021-03-04 ENCOUNTER — Other Ambulatory Visit: Payer: Self-pay

## 2021-03-04 VITALS — BP 120/82 | HR 72 | Ht 74.0 in | Wt 259.0 lb

## 2021-03-04 DIAGNOSIS — E785 Hyperlipidemia, unspecified: Secondary | ICD-10-CM

## 2021-03-04 DIAGNOSIS — I350 Nonrheumatic aortic (valve) stenosis: Secondary | ICD-10-CM | POA: Diagnosis not present

## 2021-03-04 DIAGNOSIS — R252 Cramp and spasm: Secondary | ICD-10-CM | POA: Diagnosis not present

## 2021-03-04 DIAGNOSIS — Z79899 Other long term (current) drug therapy: Secondary | ICD-10-CM | POA: Diagnosis not present

## 2021-03-04 DIAGNOSIS — I359 Nonrheumatic aortic valve disorder, unspecified: Secondary | ICD-10-CM | POA: Diagnosis not present

## 2021-03-04 HISTORY — DX: Nonrheumatic aortic valve disorder, unspecified: I35.9

## 2021-03-04 NOTE — Patient Instructions (Signed)
Medication Instructions:  HOLD- Rosuvastatin(Crestor) for 2 weeks and see if this help with your cramps  *If you need a refill on your cardiac medications before your next appointment, please call your pharmacy*   Lab Work: CMP, Magnesium and Fasting Lipids  If you have labs (blood work) drawn today and your tests are completely normal, you will receive your results only by: [place] (if you have MyChart) OR A paper copy in the mail If you have any lab test that is abnormal or we need to change your treatment, we will call you to review the results.   Testing/Procedures: Your physician has requested that you have an echocardiogram. Echocardiography is a painless test that uses sound waves to create images of your heart. It provides your doctor with information about the size and shape of your heart and how well your heart's chambers and valves are working. This procedure takes approximately one hour. There are no restrictions for this procedure.  Follow-Up: At [facility], you and your health needs are our priority.  As part of our continuing mission to provide you with exceptional heart care, we have created designated Provider Care Teams.  These Care Teams include your primary Cardiologist (physician) and Advanced Practice Providers (APPs -  Physician Assistants and Nurse Practitioners) who all work together to provide you with the care you need, when you need it.  We recommend signing up for the patient portal called "MyChart".  Sign up information is provided on this After Visit Summary.  MyChart is used to connect with patients for Virtual Visits (Telemedicine).  Patients are able to view lab/test results, encounter notes, upcoming appointments, etc.  Non-urgent messages can be sent to your provider as well.   To learn more about what you can do with MyChart, go to [email or web address].    Your next appointment:   6 month(s)  The format for your next appointment:   In  Person  Provider:   Skeet Latch, MD

## 2021-03-04 NOTE — Assessment & Plan Note (Signed)
Continue CPAP.  

## 2021-03-04 NOTE — Assessment & Plan Note (Signed)
Repeat echo.  BP well-controlled.

## 2021-03-04 NOTE — Assessment & Plan Note (Signed)
Check fasting lipids/CMP.  LDL goal is less than 70.  He is going to hold his statin for 2 weeks due to muscle cramping to see if it helps.  If it does help we will need to try an alternative statin.  If he continues to cramp he will resume rosuvastatin 20 mg daily.  We will also check a magnesium level.

## 2021-03-04 NOTE — Progress Notes (Signed)
Cardiology Office Note   Date:  03/04/2021   ID:  Chad [name], DOB 11/26/1950, MRN [id number]  PCP:  Isaac Bliss, Rayford Halsted, MD  Cardiologist:   Skeet Latch, MD   No chief complaint on file.    History of Present Illness: Chad [name] is a 70 y.o. male with chronic diastolic heart failure, asymptomatic coronary calcifications, interstitial lung disease/pulmonary fibrosis, OSA on CPAP, hyperlipidemia, and diabetes who presents for follow-up.  He initially saw Dr. Curt Bears 10/2015.  He was referred because of a CT scan that showed coronary calcifications and calcified aortic valve.  At the time he was completely asymptomatic.  Fasting lipids revealed an LDL of 126.  He had an echo 12/2011 that revealed LVEF 55 to 60%, and a mildly dilated ascending aorta.  Carotid Dopplers 11/2013 revealed 1-39% stenosis bilaterally.  He had a CPX 02/2016 that revealed mild functional impairment in gas exchange and mild restrictive physiology.  It was felt that his imitations at peak exercise were mostly attributed to body habitus.  He had a repeat echo 12/2017 that showed LVEF 60-65% with grade 2 diastolic dysfunction and no aortic aneurysm.  At his last visit, he complained of intermittent dizzy spell. He had a CPT with Dr. Chase Caller on 05/2019 that revealed moderate functional impairment mostly due to body habitus and deconditioning. There was also some chronotropic incompetence and restriction on spirometry. When he last saw his pulmonologist, his dyspnea was determined to be out of proportion of his coronary disease. Chest CT on 11/2020 showed mild cyclindrical bronchiectasis, calcification in the aorta, 2 vessel coronary disease, and calcification of the aortic valve.  Today, he reports L leg pain and L side cramps. The L side cramps occur intermittently but have been more frequently recently. The cramps worsen with eating sugar and drinking alcohol. However, Theraworx improves the pain. He feels  the cramps coming if he wakes up sweating. In the past 2-3 weeks, he is exercising more and transitioning to a more plant-based diet. He tries to drink at least 5 bottles of water daily. The changes in diet and exercise has caused him to lose some weight. He denies any palpitations, chest pain, or shortness of breath, lightheadedness, headaches, syncope, orthopnea, PND, or lower extremity edema.  Past Medical History:  Diagnosis Date   Anemia    'YEARS AGO"   Aortic valve calcification 03/04/2021   Arthritis    ASTHMA 02/11/2007        CARBUNCLE AND FURUNCLE OF LEG EXCEPT FOOT 08/14/2009   Qualifier: Diagnosis of  By: Wynona Luna    Carotid stenosis, asymptomatic 01/19/2018   1-39% on carotid Doppler 2015.    CARPAL TUNNEL SYNDROME, LEFT 02/15/2009   Qualifier: Diagnosis of  By: Wynona Luna    Chronic interstitial lung disease ([place])    PULMONARY INFILTRATE  --  PULMOLOGIST-  DR CLANCE   Chronic steroid use    INTERSTITIAL LUNG DISEASE   COLONIC POLYPS, HX OF 12/23/2006   Qualifier: Diagnosis of  By: Marca Ancona RMA, Lucy     Complication of anesthesia    EMERGENCE COMBATIVENESS---  PLEASE REFER TO VATS 01-05-1012 PROCEDURE ,  DR Linna Caprice DOCUMENTED GRADE IV DIFFICULT VISUAL AIRWAY   DIABETES MELLITUS, TYPE II, BORDERLINE 11/14/2008   Qualifier: Diagnosis of  By: Wynona Luna    Dyspnea on exertion    Elevated PSA 08/13/2011   GERD 12/23/2006   Qualifier: Diagnosis of  By: Larose Kells  History of anal fissures    History of hypothyroidism    Hyperlipidemia 05/01/2016   Hypothyroidism 12/23/2006   Qualifier: Diagnosis of  By: Larose Kells     MEDIASTINAL LYMPHADENOPATHY 08/30/2007   Qualifier: Diagnosis of  By: Joya Gaskins MD, Burnett Harry    Mild ascending aorta dilatation ([place]) 01/19/2018   Oral thrush 04/22/2012   OSA (obstructive sleep apnea) 12/23/2006   Pt prefers to stay on auto setting.     Pneumonia 11/13/2011   Prostate cancer ([place]) 06/09/12 bx   Adenocarcinoma    PULMONARY EDEMA 07/05/2007   Qualifier: Diagnosis of  By: Joya Gaskins MD, Burnett Harry    Pulmonary infiltrate 12/23/2011   H/o LN, pericardial effusion, pleural effusion unknown origin 2009.  ?resolved with prednisone?? Mediastinoscopy 2009:  Benign lymphoid hyperplasia Ct chest 11/2011: scattered GGO, interstitial changes, small pulmonary nodules, mild LN No response to prolonged course of abx. Autoimmune w/u:  ESR 78, but ACE/RF/DSDNA/CCP/ENA/SCL70/HP panel/ANCA all neg.  ANA low positive (prob neg). VATS bx 12/2011:  CIP with organizing pna.  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  V/Q 01/2012:  Ok +++ response to steroids (started 12/2011) PFT's 12/2012:  No obstruction, TLC 3.25 (42%), DLCO 44% Pulmonary rehab 2014 xrays and symptoms greatly improved with steroids, and no recurrence off steroids    RHINOSINUSITIS, ACUTE 01/29/2010   Qualifier: Diagnosis of  By: Fanny Skates PAIN, LEFT 11/14/2008   Qualifier: Diagnosis of  By: Wynona Luna    Sinusitis 11/26/2010   Sleep apnea    on cpap   Thrombocytopenia [facility])     Past Surgical History:  Procedure Laterality Date   CARDIOVASCULAR STRESS TEST  01-10-2008   NORMAL EXERCISE STRESS TEST AT GOOD WORKLOAD   COLONOSCOPY  01-15-2009   polyps and tics   LUNG BIOPSY     MEDIASTINOSCOPY  07-12-2007   BILATERAL PLEURAL EFFUSIONS   NASAL SINUS SURGERY     POLYPECTOMY     prostate biopsy  06/11/12   Adenocarcinoma   RADIOACTIVE SEED IMPLANT N/A 12/23/2012   Procedure: RADIOACTIVE SEED IMPLANT;  Surgeon: Franchot Gallo, MD;  Location: [facility];  Service: Urology;  Laterality: N/A;   82   seeds implanted    TRANSTHORACIC ECHOCARDIOGRAM  12-31-2011   NORMAL LVF/   EF  55-60%   VIDEO ASSISTED THORACOSCOPY (VATS)/ LYMPH NODE SAMPLING Left 01-05-2012   LUNG AND LYMPH NODE BX'S (CHRONIC INTERSTITIAL PNEUMONIA)     Current Outpatient Medications  Medication Sig Dispense Refill   albuterol (VENTOLIN HFA) 108 (90 Base)  MCG/ACT inhaler INHALE 2 PUFFS BY MOUTH FOUR TIMES A DAY FOR LUNG IRRITATION, BREATHING, LUNG SCARRING, MILD ASTHMA.  TAKE 2 PUFFS  WHILE STANDING UP WHEN NEEDED UP TO 4 TIMES A DAY. FOR LUNG IRRITATION, BREATHING, LUNG SCARRING, MILD ASTHMA.  TAKE 2 PUFFS   WHILE STANDING UP WHEN NEEDED UP TO 4 TIMES A DAY.     Cholecalciferol 50 MCG (2000 UT) TABS TAKE ONE TABLET BY MOUTH DAILY FOR LOW VITAMIN D     fluticasone (FLONASE) 50 MCG/ACT nasal spray INSTILL 2 SPRAYS IN EACH NOSTRIL DAILY FOR ALLERGIC RHINITIS, ALLERGIES, NASAL CONGESTION.     Ibuprofen 200 MG CAPS Take 600 mg by mouth as needed.      rosuvastatin (CRESTOR) 20 MG tablet Take 1 tablet (20 mg total) by mouth daily. 90 tablet 3   Melatonin 3 MG CAPS TAKE 1 TABLET/CAPSULE (3MG) BY MOUTH AS DIRECTED BY  YOUR PROVIDER FOR SLEEP.  MAY TAKE ONE TABLET ONE HOUR BEFORE BEDTIME.  MAY TAKE A SECOND TABLET  2 TO 4  HOURS LATER IF NEEDED.  NO DRIVING FOR 6 HOURS. FOR SLEEP.  MAY TAKE ONE TABLET ONE HOUR BEFORE BEDTIME.  MAY TAKE A SECOND TABLET  2 TO 4  HOURS LATER IF NEEDED.  NO DRIVING FOR 6 HOURS. (Patient not taking: Reported on 03/04/2021)     No current facility-administered medications for this visit.    Allergies:   Ivp dye [iodinated diagnostic agents], Doxycycline, and Lactose intolerance (gi)    Social History:  The patient  reports that he quit smoking about 34 years ago. His smoking use included cigarettes and cigars. He has a 0.20 pack-year smoking history. He has never used smokeless tobacco. He reports current alcohol use of about 2.0 standard drinks per week. He reports that he does not use drugs.   Family History:  The patient's family history includes Alzheimer's disease in his maternal grandmother and mother; Bradycardia in his father; Cancer in his paternal uncle; Colon cancer in his father; Diabetes in his daughter; Heart attack in his father; Hyperlipidemia in his sister; Hypertension in his mother; Nephrolithiasis in his father;  Prostate cancer in his father; Sinusitis in his brother.    ROS:  Please see the history of present illness.   (+) L leg pain (+) L side cramps  All other systems are reviewed and negative.    VS:  BP 120/82   Pulse 72   Ht _0  (1.88 m)   Wt 259 lb (117.5 kg)   SpO2 98%   BMI 33.25 kg/m  , BMI Body mass index is 33.25 kg/m. GENERAL:  Well appearing HEENT: Pupils equal round and reactive, fundi not visualized, oral mucosa unremarkable NECK:  No jugular venous distention, waveform within normal limits, carotid upstroke brisk and symmetric, no bruits LUNGS:  Clear to auscultation bilaterally HEART:  RRR.  PMI not displaced or sustained,S1 and S2 within normal limits, no S3, no S4, no clicks, no rubs, 2/6 systolic murmur ABD:  Flat, positive bowel sounds normal in frequency in pitch, no bruits, no rebound, no guarding, no midline pulsatile mass, no hepatomegaly, no splenomegaly EXT:  2 plus pulses throughout, no edema, no cyanosis no clubbing SKIN:  No rashes no nodules NEURO:  Cranial nerves II through XII grossly intact, motor grossly intact throughout PSYCH:  Cognitively intact, oriented to person place and time   EKG:   03/04/21: Sinus rhythm, rate 72 bpm; LAD 01/19/2020: Sinus rhythm.  Rate 81 bpm.  Incomplete right bundle branch block.  LVH. 01/11/2019: Sinus rhythm.  Rate 74 bpm.  Left axis deviation. 01/19/18: sinus rhythm.  Rate 78 bpm.  RSR' in V1.  LVH.  Stress Test 06/20/19 Pre-Exercise PFTs   FVC 2.77 (67%)       FEV1 2.34 (75%)         FEV1/FVC 85 (111%)         MVV 100 (70%)   Post-Exercise PFTs  (from lowest post-exercise trial (%change from rest)) FVC performed IPE, 5, 10, 15 mins   FVC 2.72 (-1%) 15 mins       FEV1 2.22 (-5%) 15 mins     FEV1/FVC 80 (-6%) 5 mins       Notes: Patient gave a very good effort. Pulse-oximetry remained 97-98% for the duration of exercise. Exercise was performed on a cycle ergometer starting at [facility] and increasing by  12W/min.  Conclusion: Exercise testing with gas exchange demonstrates moderate functional impairment when compared to matched sedentary norms. Patient's primary limitation appears secondary to body habitus and deconditioning. She has restriction on spirometry but had adequate breathing reserve at peak exercise. There was also chronotropic incompetence at peak exercise, which is possibly contributing to exercise intolerance.  Echo 01/26/18: Study Conclusions - Left ventricle: The cavity size was normal. Wall thickness was   normal. Systolic function was normal. The estimated ejection   fraction was in the range of 60% to 65%. Wall motion was normal;   there were no regional wall motion abnormalities. Features are   consistent with a pseudonormal left ventricular filling pattern,   with concomitant abnormal relaxation and increased filling   pressure (grade 2 diastolic dysfunction). - Aortic valve: Mildly calcified annulus. There was mild   regurgitation.   Recent Labs: 02/13/2021: ALT CANCELED; BUN CANCELED; Creatinine, Ser CANCELED; Potassium 4.3; Sodium 141    Lipid Panel    Component Value Date/Time   CHOL CANCELED 02/13/2021 1131   TRIG CANCELED 02/13/2021 1131   HDL CANCELED 02/13/2021 1131   CHOLHDL CANCELED 02/13/2021 1131   CHOLHDL 3 06/29/2019 0730   VLDL 11.4 06/29/2019 0730   LDLCALC CANCELED 02/13/2021 1131      Wt Readings from Last 3 Encounters:  03/04/21 259 lb (117.5 kg)  11/15/20 264 lb (119.7 kg)  11/05/20 267 lb (121.1 kg)      ASSESSMENT AND PLAN:  Mild ascending aorta dilatation ([facility]) Repeat echo.  BP well-controlled.  OSA (obstructive sleep apnea) Continue CPAP.  Hyperlipidemia Check fasting lipids/CMP.  LDL goal is less than 70.  He is going to hold his statin for 2 weeks due to muscle cramping to see if it helps.  If it does help we will need to try an alternative statin.  If he continues to cramp he will resume rosuvastatin 20 mg daily.  We  will also check a magnesium level.  Aortic valve calcification Noted on chest CT.  He did have mitral annular calcification on his echo in 2019.  He has a mild systolic murmur on exam.  We will check an echocardiogram.    Current medicines are reviewed at length with the patient today.  The patient does not have concerns regarding medicines.  The following changes have been made:  no change  Labs/ tests ordered today include:   Orders Placed This Encounter  Procedures   Lipid panel   Comp Met (CMET)   Magnesium   EKG 12-Lead   ECHOCARDIOGRAM COMPLETE      Disposition:   FU with Mickell Birdwell C. Oval Linsey, MD, [facility] in 6 months.    I,Mykaella Javier,acting as a scribe for Skeet Latch, MD.,have documented all relevant documentation on the behalf of Skeet Latch, MD,as directed by  Skeet Latch, MD while in the presence of Skeet Latch, MD.  I, [place] Oval Linsey, MD have reviewed all documentation for this visit.  The documentation of the exam, diagnosis, procedures, and orders on 03/04/2021 are all accurate and complete.   Signed, Krislynn Gronau C. Oval Linsey, MD, [facility]  03/04/2021 9:24 AM    [place]

## 2021-03-04 NOTE — Assessment & Plan Note (Signed)
Noted on chest CT.  He did have mitral annular calcification on his echo in 2019.  He has a mild systolic murmur on exam.  We will check an echocardiogram.

## 2021-03-05 ENCOUNTER — Encounter (HOSPITAL_BASED_OUTPATIENT_CLINIC_OR_DEPARTMENT_OTHER): Payer: Self-pay | Admitting: Cardiovascular Disease

## 2021-03-05 LAB — COMPREHENSIVE METABOLIC PANEL
ALT: 16 IU/L (ref 0–44)
AST: 21 IU/L (ref 0–40)
Albumin/Globulin Ratio: 1.2 (ref 1.2–2.2)
Albumin: 4.1 g/dL (ref 3.7–4.7)
Alkaline Phosphatase: 61 IU/L (ref 44–121)
BUN/Creatinine Ratio: 11 (ref 10–24)
BUN: 8 mg/dL (ref 8–27)
Bilirubin Total: 0.4 mg/dL (ref 0.0–1.2)
CO2: 25 mmol/L (ref 20–29)
Calcium: 9 mg/dL (ref 8.6–10.2)
Chloride: 100 mmol/L (ref 96–106)
Creatinine, Ser: 0.7 mg/dL — ABNORMAL LOW (ref 0.76–1.27)
Globulin, Total: 3.3 g/dL (ref 1.5–4.5)
Glucose: 90 mg/dL (ref 70–99)
Potassium: 4.1 mmol/L (ref 3.5–5.2)
Sodium: 136 mmol/L (ref 134–144)
Total Protein: 7.4 g/dL (ref 6.0–8.5)
eGFR: 99 mL/min/{1.73_m2} (ref >59)

## 2021-03-05 LAB — LIPID PANEL
Chol/HDL Ratio: 2.4 ratio (ref 0.0–5.0)
Cholesterol, Total: 133 mg/dL (ref 100–199)
HDL: 56 mg/dL (ref >39)
LDL Chol Calc (NIH): 64 mg/dL (ref 0–99)
Triglycerides: 63 mg/dL (ref 0–149)
VLDL Cholesterol Cal: 13 mg/dL (ref 5–40)

## 2021-03-05 LAB — MAGNESIUM: Magnesium: 2 mg/dL (ref 1.6–2.3)

## 2021-03-18 ENCOUNTER — Ambulatory Visit (INDEPENDENT_AMBULATORY_CARE_PROVIDER_SITE_OTHER): Payer: Medicare Other

## 2021-03-18 ENCOUNTER — Other Ambulatory Visit: Payer: Self-pay

## 2021-03-18 DIAGNOSIS — I350 Nonrheumatic aortic (valve) stenosis: Secondary | ICD-10-CM | POA: Diagnosis not present

## 2021-03-18 LAB — ECHOCARDIOGRAM COMPLETE
AR max vel: 1.71 cm2
AV Area VTI: 1.46 cm2
AV Area mean vel: 1.42 cm2
AV Mean grad: 12.3 mmHg
AV Peak grad: 21.7 mmHg
AV Vena cont: 0.39 cm
Ao pk vel: 2.33 m/s
Area-P 1/2: 3.85 cm2
P 1/2 time: 409 msec
S' Lateral: 3.48 cm
Single Plane A4C EF: 62.5 %

## 2021-05-14 ENCOUNTER — Telehealth: Payer: Self-pay | Admitting: Pulmonary Disease

## 2021-05-14 DIAGNOSIS — J019 Acute sinusitis, unspecified: Secondary | ICD-10-CM | POA: Diagnosis not present

## 2021-05-16 ENCOUNTER — Telehealth: Payer: Self-pay

## 2021-05-16 NOTE — Telephone Encounter (Signed)
Dr Halford Chessman Please advise:     Patient is calling in regards to his CPAP machine. He thinks it's messing with his sinuses,Still thinks the pressure is still to high in his CPAP. States he has tried changing the humidifier setting and nothing seems to help.

## 2021-05-16 NOTE — Telephone Encounter (Signed)
Dr Halford Chessman Please advise:   Patient is calling in regards to his CPAP machine. He thinks it's messing with his sinuses,Still thinks the pressure is still to high in his CPAP. States he has tried changing the humidifier setting and nothing seems to help.

## 2021-05-21 ENCOUNTER — Encounter: Payer: Self-pay | Admitting: Pulmonary Disease

## 2021-05-21 ENCOUNTER — Other Ambulatory Visit: Payer: Self-pay

## 2021-05-21 ENCOUNTER — Ambulatory Visit (INDEPENDENT_AMBULATORY_CARE_PROVIDER_SITE_OTHER): Payer: Medicare Other | Admitting: Pulmonary Disease

## 2021-05-21 VITALS — BP 118/74 | HR 83 | Temp 98.4°F | Ht 74.0 in | Wt 252.0 lb

## 2021-05-21 DIAGNOSIS — G4733 Obstructive sleep apnea (adult) (pediatric): Secondary | ICD-10-CM

## 2021-05-21 DIAGNOSIS — J310 Chronic rhinitis: Secondary | ICD-10-CM | POA: Diagnosis not present

## 2021-05-21 MED ORDER — AZELASTINE HCL 0.15 % NA SOLN: 1.0000 | Freq: Two times a day (BID) | NASAL | Status: DC

## 2021-05-21 NOTE — Telephone Encounter (Signed)
Please schedule an ROV with me or NP to discuss in more detail.

## 2021-05-21 NOTE — Patient Instructions (Signed)
Will have [facility] change your CPAP to 6 cm water pressure  Use astepro one spray in each nostril in the morning and the evening.  Use flonase 1 spray in each nostril nightly.  Use saline nasal rinse nightly.  Follow up in 6 months.

## 2021-05-21 NOTE — Telephone Encounter (Signed)
Called and spoke with pt letting him know the info per VS and he verbalized understanding. Pt has been scheduled an appt with VS today 2/21 at 11:30. Nothing further needed.

## 2021-05-21 NOTE — Progress Notes (Signed)
[name]  Chief Complaint  Patient presents with   Follow-up    He reports that he has a new machine and he keeps getting a sinus infection.     Constitutional:  BP 118/74 (BP Location: Left Arm, Patient Position: Sitting, Cuff Size: Normal)    Pulse 83    Temp 98.4 F (36.9 C) (Oral)    Ht 6' 2"  (1.88 m)    Wt 252 lb (114.3 kg)    SpO2 98%    BMI 32.35 kg/m   Past Medical History:  Prostate cancer, Hypothyroidism, Pulmonary infiltrates 2013 s/p VATs lung bx  Past Surgical History:  His  has a past surgical history that includes Mediastinoscopy (07-12-2007); Nasal sinus surgery; prostate biopsy (06/11/12); Video assisted thoracoscopy (vats)/ lymph node sampling (Left, 01-05-2012); Cardiovascular stress test (01-10-2008); transthoracic echocardiogram (12-31-2011); Radioactive seed implant (N/A, 12/23/2012); Colonoscopy (01-15-2009); Lung biopsy; and Polypectomy.  Brief Summary:  Chad [name] is a 71 y.o. male former smoker with obstructive sleep apnea and bronchiectasis.      Subjective:   He called to office last week. Still having trouble with his sinuses.  He feels the pressure on CPAP is still too high.  He tried adjusting the humidifier setting on his CPAP.  He had called to office in October 2022 with similar complaint and order to lower CPAP to 6 cm H2O was sent to his DME.  Uncertain if pressure was ever actually changed.  He gets pressure and drainage.  Gets  tickle in his throat.  Went to urgent care.  Treatment with amoxicillin and prednisone didn't help.  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 CPAP 10/06/20 to 11/04/20 >> used on 30 of 30 nights with average 6 hrs 12 min.  Average AHI 0.5 with CPAP 8 cm H2O  Cardiac Tests:  Echo 01/26/18 >> EF 60 to 65%, grade 2 DD, mild AR CPET 06/20/19 >> deconditioning, restriction, chronotropic incompetence  Social History:  He  reports that he quit smoking about 35 years ago. His smoking use included cigarettes and cigars. He has a 0.20 pack-year smoking history. He has never used smokeless tobacco. He reports current alcohol use of about 2.0 standard drinks per week. He reports that he does not use drugs.  Family History:  His family history includes Alzheimer's disease in his maternal  grandmother and mother; Bradycardia in his father; Cancer in his paternal uncle; Colon cancer in his father; Diabetes in his daughter; Heart attack in his father; Hyperlipidemia in his sister; Hypertension in his mother; Nephrolithiasis in his father; Prostate cancer in his father; Sinusitis in his brother.     Assessment/Plan:   Obstructive sleep apnea. - he is compliant with CPAP and reports benefit from therapy - uses [facility] for his DME - will have his CPAP change from 8 to 6 cm H2O  CPAP rhinitis. - add astepro 1 spray bid - continue nasal  irrigation and flonase nightly  Tracheobronchomalacia. Bronchiectasis. - f/u with Dr. Chase Caller  Time Spent Involved in Patient Care on Day of Examination:  22 minutes  Follow up:   Patient Instructions  Will have [facility] change your CPAP to 6 cm water pressure  Use astepro one spray in each nostril in the morning and the evening.  Use flonase 1 spray in each nostril nightly.  Use saline nasal rinse nightly.  Follow up in 6 months.  Medication List:   Allergies as of 05/21/2021       Reactions   Ivp Dye [iodinated Contrast Media] Anaphylaxis   Coma for a day in '09   Doxycycline Nausea Only   Lactose Intolerance (gi) Other (See Comments)        Medication List        Accurate as of May 21, 2021 12:08 PM. If you have any questions, ask your nurse or doctor.          STOP taking these medications    Melatonin 3 MG Caps Stopped by: Chesley Mires, MD       TAKE these medications    albuterol 108 (90 Base) MCG/ACT inhaler Commonly known as: VENTOLIN HFA INHALE 2 PUFFS BY MOUTH FOUR TIMES A DAY FOR LUNG IRRITATION, BREATHING, LUNG SCARRING, MILD ASTHMA.  TAKE 2 PUFFS  WHILE STANDING UP WHEN NEEDED UP TO 4 TIMES A DAY. FOR LUNG IRRITATION, BREATHING, LUNG SCARRING, MILD ASTHMA.  TAKE 2 PUFFS   WHILE STANDING UP WHEN NEEDED UP TO 4 TIMES A DAY.   Azelastine HCl 0.15 % Soln Commonly known as: Astepro Place 1 spray into the nose 2 (two) times daily. Started by: Chesley Mires, MD   Cholecalciferol 50 MCG (2000 UT) Tabs TAKE ONE TABLET BY MOUTH DAILY FOR LOW VITAMIN D   fluticasone 50 MCG/ACT nasal spray Commonly known as: FLONASE INSTILL 2 SPRAYS IN EACH NOSTRIL DAILY FOR ALLERGIC RHINITIS, ALLERGIES, NASAL CONGESTION.   Ibuprofen 200 MG Caps Take 600 mg by mouth as needed.   rosuvastatin 20 MG tablet Commonly known as: CRESTOR Take 1 tablet (20 mg total) by mouth daily.        Signature:  Chesley Mires, MD [place] Pager  - [phone] 05/21/2021, 12:08 PM

## 2021-05-22 NOTE — Telephone Encounter (Signed)
Will close encounter, as patient was seen 05/21/2021.

## 2021-05-28 ENCOUNTER — Ambulatory Visit (INDEPENDENT_AMBULATORY_CARE_PROVIDER_SITE_OTHER): Payer: Medicare Other | Admitting: Internal Medicine

## 2021-05-28 ENCOUNTER — Encounter: Payer: Self-pay | Admitting: Internal Medicine

## 2021-05-28 VITALS — BP 110/80 | HR 80 | Temp 97.5°F | Ht 73.5 in | Wt 245.8 lb

## 2021-05-28 DIAGNOSIS — C61 Malignant neoplasm of prostate: Secondary | ICD-10-CM | POA: Diagnosis not present

## 2021-05-28 DIAGNOSIS — E1169 Type 2 diabetes mellitus with other specified complication: Secondary | ICD-10-CM | POA: Diagnosis not present

## 2021-05-28 DIAGNOSIS — Z0000 Encounter for general adult medical examination without abnormal findings: Secondary | ICD-10-CM

## 2021-05-28 DIAGNOSIS — I700 Atherosclerosis of aorta: Secondary | ICD-10-CM | POA: Diagnosis not present

## 2021-05-28 DIAGNOSIS — K219 Gastro-esophageal reflux disease without esophagitis: Secondary | ICD-10-CM

## 2021-05-28 DIAGNOSIS — G8929 Other chronic pain: Secondary | ICD-10-CM | POA: Diagnosis not present

## 2021-05-28 DIAGNOSIS — E039 Hypothyroidism, unspecified: Secondary | ICD-10-CM | POA: Diagnosis not present

## 2021-05-28 DIAGNOSIS — M545 Low back pain, unspecified: Secondary | ICD-10-CM

## 2021-05-28 DIAGNOSIS — G4733 Obstructive sleep apnea (adult) (pediatric): Secondary | ICD-10-CM

## 2021-05-28 DIAGNOSIS — H8112 Benign paroxysmal vertigo, left ear: Secondary | ICD-10-CM | POA: Diagnosis not present

## 2021-05-28 DIAGNOSIS — E785 Hyperlipidemia, unspecified: Secondary | ICD-10-CM

## 2021-05-28 LAB — LIPID PANEL
Cholesterol: 203 mg/dL — ABNORMAL HIGH (ref 0–200)
HDL: 59.9 mg/dL (ref >39.00)
LDL Cholesterol: 129 mg/dL — ABNORMAL HIGH (ref 0–99)
NonHDL: 142.98
Total CHOL/HDL Ratio: 3
Triglycerides: 69 mg/dL (ref 0.0–149.0)
VLDL: 13.8 mg/dL (ref 0.0–40.0)

## 2021-05-28 LAB — COMPREHENSIVE METABOLIC PANEL
ALT: 12 U/L (ref 0–53)
AST: 20 U/L (ref 0–37)
Albumin: 4.2 g/dL (ref 3.5–5.2)
Alkaline Phosphatase: 57 U/L (ref 39–117)
BUN: 9 mg/dL (ref 6–23)
CO2: 27 mEq/L (ref 19–32)
Calcium: 9.8 mg/dL (ref 8.4–10.5)
Chloride: 99 mEq/L (ref 96–112)
Creatinine, Ser: 0.69 mg/dL (ref 0.40–1.50)
GFR: 92.74 mL/min (ref >60.00)
Glucose, Bld: 84 mg/dL (ref 70–99)
Potassium: 4.2 mEq/L (ref 3.5–5.1)
Sodium: 133 mEq/L — ABNORMAL LOW (ref 135–145)
Total Bilirubin: 0.6 mg/dL (ref 0.2–1.2)
Total Protein: 8.3 g/dL (ref 6.0–8.3)

## 2021-05-28 LAB — CBC WITH DIFFERENTIAL/PLATELET
Basophils Absolute: 0.1 10*3/uL (ref 0.0–0.1)
Basophils Relative: 0.9 % (ref 0.0–3.0)
Eosinophils Absolute: 0.2 10*3/uL (ref 0.0–0.7)
Eosinophils Relative: 3.5 % (ref 0.0–5.0)
HCT: 42.2 % (ref 39.0–52.0)
Hemoglobin: 13.8 g/dL (ref 13.0–17.0)
Lymphocytes Relative: 26.3 % (ref 12.0–46.0)
Lymphs Abs: 1.8 10*3/uL (ref 0.7–4.0)
MCHC: 32.8 g/dL (ref 30.0–36.0)
MCV: 91 fl (ref 78.0–100.0)
Monocytes Absolute: 0.5 10*3/uL (ref 0.1–1.0)
Monocytes Relative: 6.8 % (ref 3.0–12.0)
Neutro Abs: 4.3 10*3/uL (ref 1.4–7.7)
Neutrophils Relative %: 62.5 % (ref 43.0–77.0)
Platelets: 143 10*3/uL — ABNORMAL LOW (ref 150.0–400.0)
RBC: 4.63 Mil/uL (ref 4.22–5.81)
RDW: 15.2 % (ref 11.5–15.5)
WBC: 6.8 10*3/uL (ref 4.0–10.5)

## 2021-05-28 LAB — HEMOGLOBIN A1C: Hgb A1c MFr Bld: 5.6 % (ref 4.6–6.5)

## 2021-05-28 LAB — MICROALBUMIN / CREATININE URINE RATIO
Creatinine,U: 47.5 mg/dL
Microalb Creat Ratio: 1.5 mg/g (ref 0.0–30.0)
Microalb, Ur: <0.7 mg/dL (ref 0.0–1.9)

## 2021-05-28 LAB — TSH: TSH: 2.42 u[IU]/mL (ref 0.35–5.50)

## 2021-05-28 NOTE — Progress Notes (Signed)
Established Patient Office Visit     This visit occurred during the SARS-CoV-2 public health emergency.  Safety protocols were in place, including screening questions prior to the visit, additional usage of staff PPE, and extensive cleaning of exam room while observing appropriate contact time as indicated for disinfecting solutions.    CC/Reason for Visit: Subsequent Medicare wellness visit, follow-up chronic medical conditions, discuss acute concerns  HPI: Chad [name] is a 71 y.o. male who is coming in today for the above mentioned reasons. Past Medical History is significant for: Hyperlipidemia, prostate cancer, type 2 diabetes, GERD, hypothyroidism, OSA/ILD on CPAP.  His care is fragmented between here and the [place].  He tells me that he quit taking his rosuvastatin because of significant muscle aches, he has discussed this with his cardiologist.  He has been experiencing some vertigo usually when looking towards the left side.  He tells me that at the [place] he had an MRI of the brain that was normal, I do not have this report.  He had a colonoscopy in 2021 and was advised to 5-year follow-up.  He is overdue for COVID booster, Tdap, second shingles vaccine.  He is overdue for an eye exam, he has routine dental care.   Past Medical/Surgical History: Past Medical History:  Diagnosis Date   Anemia    'YEARS AGO"   Aortic valve calcification 03/04/2021   Arthritis    ASTHMA 02/11/2007        CARBUNCLE AND FURUNCLE OF LEG EXCEPT FOOT 08/14/2009   Qualifier: Diagnosis of  By: Wynona Luna    Carotid stenosis, asymptomatic 01/19/2018   1-39% on carotid Doppler 2015.    CARPAL TUNNEL SYNDROME, LEFT 02/15/2009   Qualifier: Diagnosis of  By: Wynona Luna    Chronic interstitial lung disease ([place])    PULMONARY INFILTRATE  --  PULMOLOGIST-  DR CLANCE   Chronic steroid use    INTERSTITIAL LUNG DISEASE   COLONIC POLYPS, HX OF 12/23/2006   Qualifier: Diagnosis of  By: Marca Ancona RMA,  Lucy     Complication of anesthesia    EMERGENCE COMBATIVENESS---  PLEASE REFER TO VATS 01-05-1012 PROCEDURE ,  DR Linna Caprice DOCUMENTED GRADE IV DIFFICULT VISUAL AIRWAY   DIABETES MELLITUS, TYPE II, BORDERLINE 11/14/2008   Qualifier: Diagnosis of  By: Wynona Luna    Dyspnea on exertion    Elevated PSA 08/13/2011   GERD 12/23/2006   Qualifier: Diagnosis of  By: Marca Ancona RMA, Lucy     History of anal fissures    History of hypothyroidism    Hyperlipidemia 05/01/2016   Hypothyroidism 12/23/2006   Qualifier: Diagnosis of  By: Larose Kells     MEDIASTINAL LYMPHADENOPATHY 08/30/2007   Qualifier: Diagnosis of  By: Joya Gaskins MD, Burnett Harry    Mild ascending aorta dilatation ([place]) 01/19/2018   Oral thrush 04/22/2012   OSA (obstructive sleep apnea) 12/23/2006   Pt prefers to stay on auto setting.     Pneumonia 11/13/2011   Prostate cancer ([place]) 06/09/12 bx   Adenocarcinoma   PULMONARY EDEMA 07/05/2007   Qualifier: Diagnosis of  By: Joya Gaskins MD, Burnett Harry    Pulmonary infiltrate 12/23/2011   H/o LN, pericardial effusion, pleural effusion unknown origin 2009.  ?resolved with prednisone?? Mediastinoscopy 2009:  Benign lymphoid hyperplasia Ct chest 11/2011: scattered GGO, interstitial changes, small pulmonary nodules, mild LN No response to prolonged course of abx. Autoimmune w/u:  ESR 78, but ACE/RF/DSDNA/CCP/ENA/SCL70/HP panel/ANCA all neg.  ANA low positive (prob neg). VATS bx 12/2011:  CIP with organizing pna.  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  V/Q 01/2012:  Ok +++ response to steroids (started 12/2011) PFT's 12/2012:  No obstruction, TLC 3.25 (42%), DLCO 44% Pulmonary rehab 2014 xrays and symptoms greatly improved with steroids, and no recurrence off steroids    RHINOSINUSITIS, ACUTE 01/29/2010   Qualifier: Diagnosis of  By: Fanny Skates PAIN, LEFT 11/14/2008   Qualifier: Diagnosis of  By: Wynona Luna    Sinusitis 11/26/2010   Sleep apnea    on cpap   Thrombocytopenia [facility])     Past  Surgical History:  Procedure Laterality Date   CARDIOVASCULAR STRESS TEST  01-10-2008   NORMAL EXERCISE STRESS TEST AT [facility]   COLONOSCOPY  01-15-2009   polyps and tics   LUNG BIOPSY     MEDIASTINOSCOPY  07-12-2007   BILATERAL PLEURAL EFFUSIONS   NASAL SINUS SURGERY     POLYPECTOMY     prostate biopsy  06/11/12   Adenocarcinoma   RADIOACTIVE SEED IMPLANT N/A 12/23/2012   Procedure: RADIOACTIVE SEED IMPLANT;  Surgeon: Franchot Gallo, MD;  Location: [facility];  Service: Urology;  Laterality: N/A;   81   seeds implanted    TRANSTHORACIC ECHOCARDIOGRAM  12-31-2011   NORMAL LVF/   EF  55-60%   VIDEO ASSISTED THORACOSCOPY (VATS)/ LYMPH NODE SAMPLING Left 01-05-2012   LUNG AND LYMPH NODE BX'S (CHRONIC INTERSTITIAL PNEUMONIA)    Social History:  reports that he quit smoking about 35 years ago. His smoking use included cigarettes and cigars. He has a 0.20 pack-year smoking history. He has never used smokeless tobacco. He reports current alcohol use of about 2.0 standard drinks per week. He reports that he does not use drugs.  Allergies: Allergies  Allergen Reactions   Ivp Dye [Iodinated Contrast Media] Anaphylaxis    Coma for a day in '09   Doxycycline Nausea Only   Lactose Intolerance (Gi) Other (See Comments)    Family History:  Family History  Problem Relation Age of Onset   Heart attack Father    Prostate cancer Father        seed implant   Nephrolithiasis Father    Colon cancer Father        passed 08-2013   Bradycardia Father        pacemaker   Alzheimer's disease Mother    Hypertension Mother    Hyperlipidemia Sister    Sinusitis Brother    Diabetes Daughter    Cancer Paternal Uncle    Alzheimer's disease Maternal Grandmother    Esophageal cancer Neg Hx    Stomach cancer Neg Hx    Rectal cancer Neg Hx      Current Outpatient Medications:    albuterol (VENTOLIN HFA) 108 (90 Base) MCG/ACT inhaler, INHALE 2 PUFFS BY MOUTH FOUR TIMES A DAY  FOR LUNG IRRITATION, BREATHING, LUNG SCARRING, MILD ASTHMA.  TAKE 2 PUFFS  WHILE STANDING UP WHEN NEEDED UP TO 4 TIMES A DAY. FOR LUNG IRRITATION, BREATHING, LUNG SCARRING, MILD ASTHMA.  TAKE 2 PUFFS   WHILE STANDING UP WHEN NEEDED UP TO 4 TIMES A DAY., Disp: , Rfl:    Azelastine HCl (ASTEPRO) 0.15 % SOLN, Place 1 spray into the nose 2 (two) times daily., Disp: 30 mL, Rfl: ML   Cholecalciferol 50 MCG (2000 UT) TABS, TAKE ONE TABLET BY MOUTH DAILY FOR LOW VITAMIN D, Disp: , Rfl:  fluticasone (FLONASE) 50 MCG/ACT nasal spray, INSTILL 2 SPRAYS IN EACH NOSTRIL DAILY FOR ALLERGIC RHINITIS, ALLERGIES, NASAL CONGESTION., Disp: , Rfl:    Ibuprofen 200 MG CAPS, Take 600 mg by mouth as needed. , Disp: , Rfl:    rosuvastatin (CRESTOR) 20 MG tablet, Take 1 tablet (20 mg total) by mouth daily. (Patient not taking: Reported on 05/28/2021), Disp: 90 tablet, Rfl: 3  Review of Systems:  Constitutional: Denies fever, chills, diaphoresis, appetite change and fatigue.  HEENT: Denies photophobia, eye pain, redness, hearing loss, ear pain, congestion, sore throat, rhinorrhea, sneezing, mouth sores, trouble swallowing, neck pain, neck stiffness and tinnitus.   Respiratory: Denies SOB, DOE, cough, chest tightness,  and wheezing.   Cardiovascular: Denies chest pain, palpitations and leg swelling.  Gastrointestinal: Denies nausea, vomiting, abdominal pain, diarrhea, constipation, blood in stool and abdominal distention.  Genitourinary: Denies dysuria, urgency, frequency, hematuria, flank pain and difficulty urinating.  Endocrine: Denies: hot or cold intolerance, sweats, changes in hair or nails, polyuria, polydipsia. Musculoskeletal: Denies myalgias, back pain, joint swelling, arthralgias and gait problem.  Skin: Denies pallor, rash and wound.  Neurological: Denies seizures, syncope, weakness, light-headedness, numbness and headaches.  Hematological: Denies adenopathy. Easy bruising, personal or family bleeding history   Psychiatric/Behavioral: Denies suicidal ideation, mood changes, confusion, nervousness, sleep disturbance and agitation    Physical Exam: Vitals:   05/28/21 1027  BP: 110/80  Pulse: 80  Temp: (!) 97.5 F (36.4 C)  TempSrc: Oral  SpO2: 99%  Weight: 245 lb 12.8 oz (111.5 kg)  Height: 6' 1.5" (1.867 m)    Body mass index is 31.99 kg/m.   Constitutional: NAD, calm, comfortable Eyes: PERRL, lids and conjunctivae normal ENMT: Mucous membranes are moist. Posterior pharynx clear of any exudate or lesions. Normal dentition. Tympanic membrane is pearly white, no erythema or bulging. Neck: normal, supple, no masses, no thyromegaly Respiratory: clear to auscultation bilaterally, no wheezing, no crackles. Normal respiratory effort. No accessory muscle use.  Cardiovascular: Regular rate and rhythm, no murmurs / rubs / gallops. No extremity edema. 2+ pedal pulses. No carotid bruits.  Abdomen: no tenderness, no masses palpated. No hepatosplenomegaly. Bowel sounds positive.  Musculoskeletal: no clubbing / cyanosis. No joint deformity upper and lower extremities. Good ROM, no contractures. Normal muscle tone.  Skin: no rashes, lesions, ulcers. No induration Neurologic: CN 2-12 grossly intact. Sensation intact, DTR normal. Strength 5/5 in all 4.  Psychiatric: Normal judgment and insight. Alert and oriented x 3. Normal mood.   Subsequent Medicare wellness visit   1. Risk factors, based on past  M,S,F -cardiovascular disease risk factors include age, gender, history of hyperlipidemia, type 2 diabetes and known aortic atherosclerosis   2.  Physical activities: He walks on an almost daily basis,   3.  Depression/mood: Stable, not depressed   4.  Hearing: No perceived issues   5.  ADL's: Independent in all ADLs   6.  Fall risk: Low fall risk   7.  Home safety: No problems identified   8.  Height weight, and visual acuity: height and weight as above, vision:  Vision Screening   Right eye  Left eye Both eyes  Without correction 20/20 20/25 20/20  With correction        9.  Counseling: Advised to update vaccination status   10. Lab orders based on risk factors: Laboratory update will be reviewed   11. Referral : Vestibular therapy   12. Care plan: Follow-up with me in 6 to 12 months  13. Cognitive assessment: No cognitive impairment   14. Screening: Patient provided with a written and personalized 5-10 year screening schedule in the AVS. yes   15. Provider List Update: PCP, cardiology, [place] physician  16. Advance Directives: Full code   17. Opioids: Patient is not on any opioid prescriptions and has no risk factors for a substance use disorder.   [place] Office Visit from 05/28/2021 in [place] at [place]  PHQ-9 Total Score 3       Fall Risk 01/14/2018 02/01/2019 02/23/2019 06/29/2019 05/28/2021  Falls in the past year? Yes 0 0 0 0  Number of falls in past year - - - - -  Was there an injury with Fall? Yes 0 - 0 0  Fall Risk Category Calculator - 0 - 0 0  Fall Risk Category - Low - Low Low  Fall risk Follow up Education provided - - - Falls evaluation completed     Impression and Plan:  Encounter for subsequent annual wellness visit (AWV) in Medicare patient -Recommend routine eye and dental care. -Immunizations: Due for COVID booster, second shingles vaccine and Tdap, other immunizations are up-to-date -Healthy lifestyle discussed in detail. -Labs to be updated today. -Colon cancer screening: 05/2019, 5-year callback -Breast cancer screening: Not applicable  -Cervical cancer screening: Not applicable -Lung cancer screening: Not applicable -Prostate cancer screening: Not applicable is being treated for prostate cancer -DEXA: Not applicable  Hyperlipidemia, unspecified hyperlipidemia type  - Plan: Lipid panel, Lipid panel  Hypothyroidism, unspecified type  - Plan: TSH, TSH  Prostate cancer ([place]) -Followed yearly by urology  through [facility].  BPPV (benign paroxysmal positional vertigo), left  - Plan: Ambulatory referral to Physical Therapy -Suspect left-sided BPPV, he reports he recently had an MRI of the brain at the [place] that was negative. -I will refer to vestibular therapy today.  Chronic right-sided low back pain without sciatica -Have advised conservative measures for now including icing, ibuprofen, back exercises, can consider referral to physical therapy if no improvement.  Gastroesophageal reflux disease without esophagitis -Controlled without daily PPI therapy.  OSA (obstructive sleep apnea) -On nightly CPAP therapy.  Type 2 diabetes mellitus with other specified complication, without long-term current use of insulin (HCC)  - Plan: CBC with Differential/Platelet, Comprehensive metabolic panel, Hemoglobin A1c, Microalbumin / creatinine urine ratio, Microalbumin / creatinine urine ratio, Hemoglobin A1c, Comprehensive metabolic panel, CBC with Differential/Platelet -Check A1c and microalbumin today, he follows mainly through the [place], he is not on any diabetic medication.  Aortic atherosclerosis ([place]) -Noted not currently on statin therapy due to intolerance, will discuss with cardiology.    Patient Instructions  -Nice seeing you today!!  -Lab work today; will notify you once results are available.  -Remember you booster for COVID, tetanus and your second shingles vaccine.  -Remember to schedule an eye exam.  -Schedule follow up in 1 year or sooner as needed.     Lelon Frohlich, MD [facility] at [facility]

## 2021-05-28 NOTE — Patient Instructions (Signed)
-  Nice seeing you today!!  -Lab work today; will notify you once results are available.  -Remember you booster for COVID, tetanus and your second shingles vaccine.  -Remember to schedule an eye exam.  -Schedule follow up in 1 year or sooner as needed.

## 2021-05-29 ENCOUNTER — Other Ambulatory Visit: Payer: Self-pay | Admitting: Internal Medicine

## 2021-05-29 DIAGNOSIS — E785 Hyperlipidemia, unspecified: Secondary | ICD-10-CM

## 2021-05-29 NOTE — Progress Notes (Signed)
1.  Unsurprisingly since he is off cholesterol medication his cholesterol is above goal.  Is he interested in a referral to the lipid clinic to discuss nonstatin alternatives to treat cholesterol?  If so, please refer  All other labs look okay

## 2021-06-01 DIAGNOSIS — M545 Low back pain, unspecified: Secondary | ICD-10-CM | POA: Diagnosis not present

## 2021-06-01 DIAGNOSIS — M549 Dorsalgia, unspecified: Secondary | ICD-10-CM | POA: Diagnosis not present

## 2021-06-01 DIAGNOSIS — G8929 Other chronic pain: Secondary | ICD-10-CM | POA: Diagnosis not present

## 2021-06-01 DIAGNOSIS — M47816 Spondylosis without myelopathy or radiculopathy, lumbar region: Secondary | ICD-10-CM | POA: Diagnosis not present

## 2021-06-05 ENCOUNTER — Encounter: Payer: Self-pay | Admitting: Physical Therapy

## 2021-06-05 ENCOUNTER — Ambulatory Visit: Payer: Medicare Other | Attending: Internal Medicine | Admitting: Physical Therapy

## 2021-06-05 ENCOUNTER — Other Ambulatory Visit: Payer: Self-pay

## 2021-06-05 DIAGNOSIS — H8112 Benign paroxysmal vertigo, left ear: Secondary | ICD-10-CM | POA: Insufficient documentation

## 2021-06-05 DIAGNOSIS — R42 Dizziness and giddiness: Secondary | ICD-10-CM | POA: Insufficient documentation

## 2021-06-05 NOTE — Therapy (Signed)
[facility] [address] [place], [place], [address] Phone: [phone]   Fax:  [phone]  Physical Therapy Evaluation  Patient Details  Name: Chad [name] MRN: [id number] Date of Birth: 08/21/1950 Referring Provider (PT): Isaac Bliss, Rayford Halsted, MD   Encounter Date: 06/05/2021   PT End of Session - 06/05/21 1159     Visit Number 1    Number of Visits 1    Date for PT Re-Evaluation 06/05/21    Authorization Type Medicare/UHC GEHA    PT Start Time 0928    PT Stop Time 1011    PT Time Calculation (min) 43 min    Activity Tolerance Patient tolerated treatment well    Behavior During Therapy [facility] for tasks assessed/performed             Past Medical History:  Diagnosis Date   Anemia    'YEARS AGO"   Aortic valve calcification 03/04/2021   Arthritis    ASTHMA 02/11/2007        CARBUNCLE AND FURUNCLE OF LEG EXCEPT FOOT 08/14/2009   Qualifier: Diagnosis of  By: Wynona Luna    Carotid stenosis, asymptomatic 01/19/2018   1-39% on carotid Doppler 2015.    CARPAL TUNNEL SYNDROME, LEFT 02/15/2009   Qualifier: Diagnosis of  By: Wynona Luna    Chronic interstitial lung disease ([place])    PULMONARY INFILTRATE  --  PULMOLOGIST-  DR CLANCE   Chronic steroid use    INTERSTITIAL LUNG DISEASE   COLONIC POLYPS, HX OF 12/23/2006   Qualifier: Diagnosis of  By: Marca Ancona RMA, Lucy     Complication of anesthesia    EMERGENCE COMBATIVENESS---  PLEASE REFER TO VATS 01-05-1012 PROCEDURE ,  DR Linna Caprice DOCUMENTED GRADE IV DIFFICULT VISUAL AIRWAY   DIABETES MELLITUS, TYPE II, BORDERLINE 11/14/2008   Qualifier: Diagnosis of  By: Wynona Luna    Dyspnea on exertion    Elevated PSA 08/13/2011   GERD 12/23/2006   Qualifier: Diagnosis of  By: Marca Ancona RMA, Lucy     History of anal fissures    History of hypothyroidism    Hyperlipidemia 05/01/2016   Hypothyroidism 12/23/2006   Qualifier: Diagnosis of  By: Larose Kells     MEDIASTINAL  LYMPHADENOPATHY 08/30/2007   Qualifier: Diagnosis of  By: Joya Gaskins MD, Burnett Harry    Mild ascending aorta dilatation ([place]) 01/19/2018   Oral thrush 04/22/2012   OSA (obstructive sleep apnea) 12/23/2006   Pt prefers to stay on auto setting.     Pneumonia 11/13/2011   Prostate cancer ([place]) 06/09/12 bx   Adenocarcinoma   PULMONARY EDEMA 07/05/2007   Qualifier: Diagnosis of  By: Joya Gaskins MD, Burnett Harry    Pulmonary infiltrate 12/23/2011   H/o LN, pericardial effusion, pleural effusion unknown origin 2009.  ?resolved with prednisone?? Mediastinoscopy 2009:  Benign lymphoid hyperplasia Ct chest 11/2011: scattered GGO, interstitial changes, small pulmonary nodules, mild LN No response to prolonged course of abx. Autoimmune w/u:  ESR 78, but ACE/RF/DSDNA/CCP/ENA/SCL70/HP panel/ANCA all neg.  ANA low positive (prob neg). VATS bx 12/2011:  CIP with organizing pna.  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  V/Q 01/2012:  Ok +++ response to steroids (started 12/2011) PFT's 12/2012:  No obstruction, TLC 3.25 (42%), DLCO 44% Pulmonary rehab 2014 xrays and symptoms greatly improved with steroids, and no recurrence off steroids    RHINOSINUSITIS, ACUTE 01/29/2010   Qualifier: Diagnosis of  By: Wynona Luna  SHOULDER PAIN, LEFT 11/14/2008   Qualifier: Diagnosis of  By: Wynona Luna    Sinusitis 11/26/2010   Sleep apnea    on cpap   Thrombocytopenia [facility])     Past Surgical History:  Procedure Laterality Date   CARDIOVASCULAR STRESS TEST  01-10-2008   NORMAL EXERCISE STRESS TEST AT [facility]   COLONOSCOPY  01-15-2009   polyps and tics   LUNG BIOPSY     MEDIASTINOSCOPY  07-12-2007   BILATERAL PLEURAL EFFUSIONS   NASAL SINUS SURGERY     POLYPECTOMY     prostate biopsy  06/11/12   Adenocarcinoma   RADIOACTIVE SEED IMPLANT N/A 12/23/2012   Procedure: RADIOACTIVE SEED IMPLANT;  Surgeon: Franchot Gallo, MD;  Location: [facility];  Service: Urology;  Laterality: N/A;   82   seeds implanted     TRANSTHORACIC ECHOCARDIOGRAM  12-31-2011   NORMAL LVF/   EF  55-60%   VIDEO ASSISTED THORACOSCOPY (VATS)/ LYMPH NODE SAMPLING Left 01-05-2012   LUNG AND LYMPH NODE BX'S (CHRONIC INTERSTITIAL PNEUMONIA)    There were no vitals filed for this visit.    Subjective Assessment - 06/05/21 0930     Subjective Patient reports dizziness/lightheadedness for 3-4 years. Episodes last seconds and are described as lightheadedness. Worse with looking up or turning head quickly; has happened to him while driving. Denies dizziness when getting out of bed. Better when staying still. Has gotten better since initial onset- has not gotten an episode in a couple weeks now. Denies head trauma, infection/illness, vision changes/double vision, hearing loss, otalgia, photo/phonophobia. Recalls prostate surgery and lung biopsy a couple years ago- was in the hospital for about a week. 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 Notes migraines occasionally.    Pertinent History asthma, DMII, GERD, HLD, prostate CA    Limitations Sitting;Reading;Lifting;Standing;Walking;House hold activities    Diagnostic tests per MD- patient reports MRI of the brain at the [place] that was negative.    Patient Stated Goals improve dizziness    Currently in Pain? Other (Comment)   referral not pain-related               [facility] PT Assessment - 06/05/21 0936       Assessment   Medical Diagnosis BPPV (benign paroxysmal positional vertigo), left    Referring Provider (PT) Isaac Bliss, Rayford Halsted, MD    Onset Date/Surgical Date 06/05/17    Next MD Visit not scheduled    Prior Therapy no      Precautions   Precautions None      Balance Screen   Has the patient fallen in the past 6 months No    Has the patient had a decrease in activity level because of a fear of falling?  No    Is the patient reluctant to leave their home because of a fear of falling?  No      Home Environment   Living  Environment Private residence    Living Arrangements Spouse/significant other    Available Help at Discharge Family    Type of [place]      Prior Function   Level of [place] Retired    Leisure none      Editor, commissioning Appears Intact                    Vestibular Assessment - 06/05/21 [phone]  Oculomotor Exam   Oculomotor Alignment Normal    Ocular ROM intact    Spontaneous Absent    Gaze-induced  Absent    Smooth Pursuits Intact    Saccades Intact    Comment convergence: R eye insufficiency ~4 in away      Oculomotor Exam-Fixation Suppressed    Left Head Impulse negative    Right Head Impulse slightly positive on 1st trial, negative on subsequent trials      Vestibulo-Ocular Reflex   VOR 1 Head Only (x 1 viewing) intact vertical and horizontal at slightly slow speed    VOR Cancellation Normal      Positional Testing   Dix-Hallpike Dix-Hallpike Right;Dix-Hallpike Left    Horizontal Canal Testing Horizontal Canal Right;Horizontal Canal Left      Dix-Hallpike Right   Dix-Hallpike Right Duration 0    Dix-Hallpike Right Symptoms No nystagmus      Dix-Hallpike Left   Dix-Hallpike Left Duration 0    Dix-Hallpike Left Symptoms No nystagmus      Horizontal Canal Right   Horizontal Canal Right Duration 0    Horizontal Canal Right Symptoms Normal      Horizontal Canal Left   Horizontal Canal Left Duration 0    Horizontal Canal Left Symptoms Normal      Orthostatics   BP supine (x 5 minutes) 108/70    HR supine (x 5 minutes) 75    BP sitting 110/72    HR sitting 84    BP standing (after 1 minute) 123/75    HR standing (after 1 minute) 83    BP standing (after 3 minutes) 122/72    HR standing (after 3 minutes) 79                Objective measurements completed on examination: See above findings.        Vestibular Treatment/Exercise - 06/05/21 0001       Vestibular Treatment/Exercise   Gaze  Exercises X1 Viewing Horizontal      X1 Viewing Horizontal   Foot Position standing    Reps --   30"   Comments at quick pace, c/o "almost dizzy"   limited gaze stability to R                   PT Education - 06/05/21 1158     Education Details prognosis, edu on exam results, HEP-  Access Code: [email or web address]    Person(s) Educated Patient    Methods Explanation;Demonstration;Tactile cues;Verbal cues;Handout    Comprehension Verbalized understanding;Returned demonstration              PT Short Term Goals - 06/05/21 1208       PT SHORT TERM GOAL #1   Title Patient to be independent with initial HEP.    Status Achieved    Target Date 06/05/21               PT Long Term Goals - 06/05/21 1208       PT LONG TERM GOAL #1   Title Patient to be independent with initial HEP.    Status Achieved    Target Date 06/05/21                    Plan - 06/05/21 1202     Clinical Impression Statement Patient is a 71 y/o M presenting to OPPT with c/o dizziness for the past 3-4 years. Episodes are described as lightheaded and last seconds. Worse with looking up  or turning head quickly as if when driving. Denies head trauma, infection/illness, vision changes/double vision, hearing loss, otalgia, photo/phonophobia. Reports baseline tinnitus hearing loss in R ear as well as migraines occasionally. Patient today presenting with slightly positive R HIT and decreased gaze stabilization with VOR to R, otherwise workup negative.  Patient was educated on VOR HEP and reported understanding. Patient at this time is independent with HEP and requires 1 time visit only d/t low intensity/irritability of symptoms. Encouraged patient to return if symptoms worsen.    Personal Factors and Comorbidities Age;Comorbidity 3+;Time since onset of injury/illness/exacerbation;Past/Current Experience    Comorbidities asthma, DMII, GERD, HLD, prostate CA    Examination-Activity Limitations Locomotion  Level;Bathing    Examination-Participation Restrictions Driving;Yard Work    Stability/Clinical Decision Making Evolving/Moderate complexity    Clinical Decision Making Moderate    Rehab Potential Good    PT Frequency One time visit    PT Next Visit Plan 1 time visit    Consulted and Agree with Plan of Care Patient             Patient will benefit from skilled therapeutic intervention in order to improve the following deficits and impairments:  Dizziness  Visit Diagnosis: Dizziness and giddiness     Problem List Patient Active Problem List   Diagnosis Date Noted   Aortic valve calcification 03/04/2021   Acute bronchitis 08/06/2020   History of obstructive sleep apnea 05/20/2018   Mild ascending aorta dilatation ([place]) 01/19/2018   Carotid stenosis, asymptomatic 01/19/2018   Hyperlipidemia 05/01/2016   Prostate cancer ([place])    Oral thrush 04/22/2012   Pulmonary infiltrate 12/23/2011   Elevated PSA 08/13/2011   Thrombocytopenia ([place]) 08/13/2011   Preventative health care 08/13/2011   CARPAL TUNNEL SYNDROME, LEFT 02/15/2009   SHOULDER PAIN, LEFT 11/14/2008   DIABETES MELLITUS, TYPE II, BORDERLINE 11/14/2008   MEDIASTINAL LYMPHADENOPATHY 08/30/2007   ASTHMA 02/11/2007   Hypothyroidism 12/23/2006   GERD 12/23/2006   OSA (obstructive sleep apnea) 12/23/2006   COLONIC POLYPS, HX OF 12/23/2006    Janene Harvey, PT, DPT 06/05/21 12:10 PM   [facility] [address] [place], [place], [address] Phone: [phone]   Fax:  [phone]  Name: Chad [name] MRN: [id number] Date of Birth: 08-Feb-1951

## 2021-06-11 DIAGNOSIS — M6281 Muscle weakness (generalized): Secondary | ICD-10-CM | POA: Diagnosis not present

## 2021-06-11 DIAGNOSIS — M545 Low back pain, unspecified: Secondary | ICD-10-CM | POA: Diagnosis not present

## 2021-06-18 DIAGNOSIS — M545 Low back pain, unspecified: Secondary | ICD-10-CM | POA: Diagnosis not present

## 2021-06-18 DIAGNOSIS — M6281 Muscle weakness (generalized): Secondary | ICD-10-CM | POA: Diagnosis not present

## 2021-06-20 DIAGNOSIS — M6281 Muscle weakness (generalized): Secondary | ICD-10-CM | POA: Diagnosis not present

## 2021-06-20 DIAGNOSIS — M545 Low back pain, unspecified: Secondary | ICD-10-CM | POA: Diagnosis not present

## 2021-06-25 DIAGNOSIS — M545 Low back pain, unspecified: Secondary | ICD-10-CM | POA: Diagnosis not present

## 2021-06-25 DIAGNOSIS — H40003 Preglaucoma, unspecified, bilateral: Secondary | ICD-10-CM | POA: Diagnosis not present

## 2021-06-25 DIAGNOSIS — M6281 Muscle weakness (generalized): Secondary | ICD-10-CM | POA: Diagnosis not present

## 2021-06-25 DIAGNOSIS — H04123 Dry eye syndrome of bilateral lacrimal glands: Secondary | ICD-10-CM | POA: Diagnosis not present

## 2021-06-25 DIAGNOSIS — H11153 Pinguecula, bilateral: Secondary | ICD-10-CM | POA: Diagnosis not present

## 2021-06-25 DIAGNOSIS — H25093 Other age-related incipient cataract, bilateral: Secondary | ICD-10-CM | POA: Diagnosis not present

## 2021-06-27 DIAGNOSIS — M6281 Muscle weakness (generalized): Secondary | ICD-10-CM | POA: Diagnosis not present

## 2021-06-27 DIAGNOSIS — M545 Low back pain, unspecified: Secondary | ICD-10-CM | POA: Diagnosis not present

## 2021-07-02 DIAGNOSIS — M545 Low back pain, unspecified: Secondary | ICD-10-CM | POA: Diagnosis not present

## 2021-07-02 DIAGNOSIS — M6281 Muscle weakness (generalized): Secondary | ICD-10-CM | POA: Diagnosis not present

## 2021-07-03 ENCOUNTER — Ambulatory Visit (INDEPENDENT_AMBULATORY_CARE_PROVIDER_SITE_OTHER): Payer: Medicare Other | Admitting: Pharmacist Clinician (PhC)/ Clinical Pharmacy Specialist

## 2021-07-03 DIAGNOSIS — E785 Hyperlipidemia, unspecified: Secondary | ICD-10-CM | POA: Diagnosis not present

## 2021-07-03 MED ORDER — ATORVASTATIN CALCIUM 10 MG PO TABS: 10.0000 mg | ORAL_TABLET | Freq: Every day | ORAL | 3 refills | Status: DC

## 2021-07-03 NOTE — Progress Notes (Signed)
07/04/2021 ?Chad [name] ?[birth year] ?[id number] ? ? ?HPI:  Chad [name] is a 71 y.o. male patient of Dr Oval Linsey, who presents today for a lipid clinic evaluation.  See pertinent past medical history below.  He was seen recently by Dr. Oval Linsey and noted that he had discontinued atorvastatin due to myalgias.  She asked that he repeat lipid labs after being off for 6-8 weeks, in order to get baseline LDL.   Today he returns for follow up.  He has been trying to follow a plant based diet, but noted that when he did that in the past, his LDL did not change significantly.  Now he eats a "mostly" plant based diet.   ? ?Past Medical History: ?CAD Coronary calcification on CT  ?DM2 2/23 A1c 5.6 (has been < 6.5 for almost 10 years now)  ?OSA   ?thrombocytopenia Platelets chronically 140-150's  ? ? ?Current Medications: rosuvastatin 20 ? ?Cholesterol Goals:  LDL < 70 ?  ?Intolerant/previously tried: rosuvastain - myalgias ? ?Family history: father had heart disease, mother had dementia; siblings with high cholesterol  ? ?Diet: tried plant pbased diet for some time, still more vegetarian ? ?Exercise:  some walking, on his feet as barber on weekends ? ?Labs: 2828/23: TC 203, TG 69, HDL 59.9, LDL 129 ? ? ?Current Outpatient Medications  ?Medication Sig Dispense Refill  ? atorvastatin (LIPITOR) 10 MG tablet Take 1 tablet (10 mg total) by mouth daily. 30 tablet 3  ? albuterol (VENTOLIN HFA) 108 (90 Base) MCG/ACT inhaler INHALE 2 PUFFS BY MOUTH FOUR TIMES A DAY FOR LUNG IRRITATION, BREATHING, LUNG SCARRING, MILD ASTHMA.  TAKE 2 PUFFS  WHILE STANDING UP WHEN NEEDED UP TO 4 TIMES A DAY. FOR LUNG IRRITATION, BREATHING, LUNG SCARRING, MILD ASTHMA.  TAKE 2 PUFFS   WHILE STANDING UP WHEN NEEDED UP TO 4 TIMES A DAY.    ? Azelastine HCl (ASTEPRO) 0.15 % SOLN Place 1 spray into the nose 2 (two) times daily. 30 mL ML  ? Cholecalciferol 50 MCG (2000 UT) TABS TAKE ONE TABLET BY MOUTH DAILY FOR LOW VITAMIN D    ? fluticasone (FLONASE) 50  MCG/ACT nasal spray INSTILL 2 SPRAYS IN EACH NOSTRIL DAILY FOR ALLERGIC RHINITIS, ALLERGIES, NASAL CONGESTION.    ? Ibuprofen 200 MG CAPS Take 600 mg by mouth as needed.  (Patient not taking: Reported on 06/05/2021)    ? ?No current facility-administered medications for this visit.  ? ? ?Allergies  ?Allergen Reactions  ? Ivp Dye [Iodinated Contrast Media] Anaphylaxis  ?  Coma for a day in '09  ? Doxycycline Nausea Only  ? Lactose Intolerance (Gi) Other (See Comments)  ? ? ?Past Medical History:  ?Diagnosis Date  ? Anemia   ? 'YEARS AGO"  ? Aortic valve calcification 03/04/2021  ? Arthritis   ? ASTHMA 02/11/2007  ?     ? CARBUNCLE AND FURUNCLE OF LEG EXCEPT FOOT 08/14/2009  ? Qualifier: Diagnosis of  By: Wynona Luna   ? Carotid stenosis, asymptomatic 01/19/2018  ? 1-39% on carotid Doppler 2015.   ? CARPAL TUNNEL SYNDROME, LEFT 02/15/2009  ? Qualifier: Diagnosis of  By: Wynona Luna   ? Chronic interstitial lung disease ([place])   ? PULMONARY INFILTRATE  --  PULMOLOGIST-  DR CLANCE  ? Chronic steroid use   ? INTERSTITIAL LUNG DISEASE  ? COLONIC POLYPS, HX OF 12/23/2006  ? Qualifier: Diagnosis of  By: Reatha Armour, Lucy    ? Complication of anesthesia   ?  EMERGENCE COMBATIVENESS---  PLEASE REFER TO VATS 01-05-1012 PROCEDURE ,  DR Linna Caprice DOCUMENTED GRADE IV DIFFICULT VISUAL AIRWAY  ? DIABETES MELLITUS, TYPE II, BORDERLINE 11/14/2008  ? Qualifier: Diagnosis of  By: Wynona Luna   ? Dyspnea on exertion   ? Elevated PSA 08/13/2011  ? GERD 12/23/2006  ? Qualifier: Diagnosis of  By: Reatha Armour, Lucy    ? History of anal fissures   ? History of hypothyroidism   ? Hyperlipidemia 05/01/2016  ? Hypothyroidism 12/23/2006  ? Qualifier: Diagnosis of  By: Reatha Armour, Lucy    ? MEDIASTINAL LYMPHADENOPATHY 08/30/2007  ? Qualifier: Diagnosis of  By: Joya Gaskins MD, Burnett Harry   ? Mild ascending aorta dilatation ([facility]) 01/19/2018  ? Oral thrush 04/22/2012  ? OSA (obstructive sleep apnea) 12/23/2006  ? Pt prefers to stay on auto setting.    ?  Pneumonia 11/13/2011  ? Prostate cancer ([place]) 06/09/12 bx  ? Adenocarcinoma  ? PULMONARY EDEMA 07/05/2007  ? Qualifier: Diagnosis of  By: Joya Gaskins MD, Burnett Harry   ? Pulmonary infiltrate 12/23/2011  ? H/o LN, pericardial effusion, pleural effusion unknown origin 2009.  ?resolved with prednisone?? Mediastinoscopy 2009:  Benign lymphoid hyperplasia Ct chest 11/2011: scattered GGO, interstitial changes, small pulmonary nodules, mild LN No response to prolonged course of abx. Autoimmune w/u:  ESR 78, but ACE/RF/DSDNA/CCP/ENA/SCL70/HP panel/ANCA all neg.  ANA low positive (prob neg). VATS bx 12/2011:  CIP with organizing pna.  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  V/Q 01/2012:  Ok +++ response to steroids (started 12/2011) PFT's 12/2012:  No obstruction, TLC 3.25 (42%), DLCO 44% Pulmonary rehab 2014 xrays and symptoms greatly improved with steroids, and no recurrence off steroids   ? RHINOSINUSITIS, ACUTE 01/29/2010  ? Qualifier: Diagnosis of  By: Wynona Luna   ? SHOULDER PAIN, LEFT 11/14/2008  ? Qualifier: Diagnosis of  By: Wynona Luna   ? Sinusitis 11/26/2010  ? Sleep apnea   ? on cpap  ? Thrombocytopenia ([place])   ? ? ?There were no vitals taken for this visit. ? ? ?Hyperlipidemia ?Patient with coronary calcifications and LDL baseline at 129.  Goal < 70.  He has only tried rosuvastatin, which caused myalgias and muscle spasms.  Insurance requires that patients try and fail 2 different statin, so will have him start atorvastatin 10 mg daily.  He is to call if he cannot tolerate this.  Reviewed options for lowering LDL cholesterol, including PCSK-9 inhibitors, bempedoic acid and inclisiran.  Discussed mechanisms of action, dosing, side effects and potential decreases in LDL cholesterol.  Also reviewed cost information and potential options for patient assistance.  Answered all patient questions.  If he is able to tolerate the low dose of atorvastatin, will continue with that, if not patient agreeable to trial of PCSK9  inhibitor.   ? ? ?Tommy Medal PharmD CPP [facility] ?[place] ?[address] ?[place], [place] [address] ?[phone] ? ? ? ?

## 2021-07-03 NOTE — Patient Instructions (Addendum)
Your Results: ?           ? Your most recent labs Goal  ?Total Cholesterol 203 < 200  ?Triglycerides 69 < 150  ?HDL (happy/good cholesterol) 59.9 > 40  ?LDL (lousy/bad cholesterol 129 < 70     ? ?Medication changes: ? Start atorvastatin 10 mg daily.  If you develop any muscle problems please call me at [phone] (Darlyne Schmiesing/Chris) ? ? ?Patient Assistance:  The [employer] foundation offers assistance to help pay for medication copays.  They will cover copays for all cholesterol lowering meds, including statins, fibrates, omega-3 oils, ezetimibe, Repatha, Praluent, Nexletol, Nexlizet.  The cards are usually good for $2,500 or 12 months, whichever comes first. ?Go to healthwellfoundation.org ?Click on ?Apply Now? ?Answer questions as to whom is applying (patient or representative) ?Your disease fund will be ?hypercholesterolemia - Medicare access? ?They will ask questions about finances and which medications you are taking for cholesterol ?When you submit, the approval is usually within minutes.  You will need to print the card information from the site ?You will need to show this information to your pharmacy, they will bill your Medicare Part D plan first -then bill [employer] --for the copay.   ?You can also call them at [phone], although the hold times can be quite long.  ? ?Thank you for choosing [facility]   ?

## 2021-07-04 ENCOUNTER — Encounter (HOSPITAL_BASED_OUTPATIENT_CLINIC_OR_DEPARTMENT_OTHER): Payer: Self-pay | Admitting: Pharmacist Clinician (PhC)/ Clinical Pharmacy Specialist

## 2021-07-04 NOTE — Assessment & Plan Note (Signed)
Patient with coronary calcifications and LDL baseline at 129.  Goal < 70.  He has only tried rosuvastatin, which caused myalgias and muscle spasms.  Insurance requires that patients try and fail 2 different statin, so will have him start atorvastatin 10 mg daily.  He is to call if he cannot tolerate this.  Reviewed options for lowering LDL cholesterol, including PCSK-9 inhibitors, bempedoic acid and inclisiran.  Discussed mechanisms of action, dosing, side effects and potential decreases in LDL cholesterol.  Also reviewed cost information and potential options for patient assistance.  Answered all patient questions.  If he is able to tolerate the low dose of atorvastatin, will continue with that, if not patient agreeable to trial of PCSK9 inhibitor.   ?

## 2021-07-05 DIAGNOSIS — G8929 Other chronic pain: Secondary | ICD-10-CM | POA: Diagnosis not present

## 2021-07-05 DIAGNOSIS — M47816 Spondylosis without myelopathy or radiculopathy, lumbar region: Secondary | ICD-10-CM | POA: Diagnosis not present

## 2021-07-05 DIAGNOSIS — M545 Low back pain, unspecified: Secondary | ICD-10-CM | POA: Diagnosis not present

## 2021-07-09 DIAGNOSIS — M6281 Muscle weakness (generalized): Secondary | ICD-10-CM | POA: Diagnosis not present

## 2021-07-09 DIAGNOSIS — M545 Low back pain, unspecified: Secondary | ICD-10-CM | POA: Diagnosis not present

## 2021-09-02 ENCOUNTER — Encounter (HOSPITAL_BASED_OUTPATIENT_CLINIC_OR_DEPARTMENT_OTHER): Payer: Self-pay | Admitting: Cardiovascular Disease

## 2021-09-02 ENCOUNTER — Ambulatory Visit (INDEPENDENT_AMBULATORY_CARE_PROVIDER_SITE_OTHER): Payer: Medicare Other | Admitting: Cardiovascular Disease

## 2021-09-02 VITALS — BP 118/74 | HR 75 | Ht 73.5 in | Wt 254.7 lb

## 2021-09-02 DIAGNOSIS — I350 Nonrheumatic aortic (valve) stenosis: Secondary | ICD-10-CM | POA: Diagnosis not present

## 2021-09-02 DIAGNOSIS — I6523 Occlusion and stenosis of bilateral carotid arteries: Secondary | ICD-10-CM | POA: Diagnosis not present

## 2021-09-02 DIAGNOSIS — Z5181 Encounter for therapeutic drug level monitoring: Secondary | ICD-10-CM

## 2021-09-02 DIAGNOSIS — E785 Hyperlipidemia, unspecified: Secondary | ICD-10-CM | POA: Diagnosis not present

## 2021-09-02 HISTORY — DX: Nonrheumatic aortic (valve) stenosis: I35.0

## 2021-09-02 NOTE — Assessment & Plan Note (Signed)
Resolved on repeat imaging in 2019.  Continue with lipid management.

## 2021-09-02 NOTE — Patient Instructions (Signed)
Medication Instructions:  Your physician recommends that you continue on your current medications as directed. Please refer to the Current Medication list given to you today.   *If you need a refill on your cardiac medications before your next appointment, please call your pharmacy*  Lab Work: FASTING LP/CMET SOON   If you have labs (blood work) drawn today and your tests are completely normal, you will receive your results only by: [place] (if you have MyChart) OR A paper copy in the mail If you have any lab test that is abnormal or we need to change your treatment, we will call you to review the results.  Testing/Procedures: Your physician has requested that you have an echocardiogram. Echocardiography is a painless test that uses sound waves to create images of your heart. It provides your doctor with information about the size and shape of your heart and how well your heart's chambers and valves are working. This procedure takes approximately one hour. There are no restrictions for this procedure.  TO BE DONE IN 1 YEAR   Follow-Up: At [facility], you and your health needs are our priority.  As part of our continuing mission to provide you with exceptional heart care, we have created designated Provider Care Teams.  These Care Teams include your primary Cardiologist (physician) and Advanced Practice Providers (APPs -  Physician Assistants and Nurse Practitioners) who all work together to provide you with the care you need, when you need it.  We recommend signing up for the patient portal called "MyChart".  Sign up information is provided on this After Visit Summary.  MyChart is used to connect with patients for Virtual Visits (Telemedicine).  Patients are able to view lab/test results, encounter notes, upcoming appointments, etc.  Non-urgent messages can be sent to your provider as well.   To learn more about what you can do with MyChart, go to [email or web address].    Your  next appointment:   12 month(s), ABOUT A WEEK AFTER YOUR ECHO   The format for your next appointment:   In Person  Provider:   Skeet Latch, MD{         .

## 2021-09-02 NOTE — Progress Notes (Signed)
Cardiology Office Note  Date:  09/02/2021   ID:  Andre Swander, DOB April 19, 1950, MRN [id number]  PCP:  Isaac Bliss, Rayford Halsted, MD  Cardiologist:   Skeet Latch, MD   No chief complaint on file.    History of Present Illness: Chad [name] is a 71 y.o. male with chronic diastolic heart failure, asymptomatic coronary calcifications, aortic stenosis, carotid stenosis, interstitial lung disease/pulmonary fibrosis, OSA on CPAP, hyperlipidemia, and diabetes who presents for follow-up.  He initially saw Dr. Curt Bears 10/2015.  He was referred because of a CT scan that showed coronary calcifications and calcified aortic valve.  At the time he was completely asymptomatic.  Fasting lipids revealed an LDL of 126.  He had an echo 12/2011 that revealed LVEF 55 to 60%, and a mildly dilated ascending aorta.  Carotid Dopplers 11/2013 revealed 1-39% stenosis bilaterally.  He had a CPX 02/2016 that revealed mild functional impairment in gas exchange and mild restrictive physiology.  It was felt that his imitations at peak exercise were mostly attributed to body habitus.  He had a repeat echo 12/2017 that showed LVEF 60-65% with grade 2 diastolic dysfunction and no aortic aneurysm.  He complained of intermittent dizzy spells. He had a cardiopulmonary exercise test with Dr. Chase Caller on 05/2019 that revealed moderate functional impairment mostly due to body habitus and deconditioning. There was also some chronotropic incompetence and restriction on spirometry. When he last saw his pulmonologist, his dyspnea was determined to be out of proportion of his coronary disease. Chest CT on 11/2020 showed mild cyclindrical bronchiectasis, calcification in the aorta, 2 vessel coronary disease, and calcification of the aortic valve.  At the last appointment he was working on lifestyle changes with increased exercise and a plant based diet. He noted left leg cramping. Atorvastatin was reduced with plans to start PCSK9 inhibitor  if his lipids remained above goal.  Repeat Echo 02/2021 revealed mild AS (mean gradient 12 mm Hg); descending aorta was not dilated. Today, he is feeling pretty good. He continues to have occasional dizzy spells, usually occurring if he turns his head quickly while sitting.  He endorses sinus congestion. Since his atorvastatin was reduced his leg cramps have resolved. Lately he admits to not exercising like he should. However, he does complete yard work without any anginal symptoms. He denies any palpitations, chest pain, shortness of breath, or peripheral edema. No headaches, syncope, orthopnea, or PND.    Past Medical History:  Diagnosis Date   Anemia    'YEARS AGO"   Aortic stenosis 09/02/2021   Aortic valve calcification 03/04/2021   Arthritis    ASTHMA 02/11/2007        CARBUNCLE AND FURUNCLE OF LEG EXCEPT FOOT 08/14/2009   Qualifier: Diagnosis of  By: Wynona Luna    Carotid stenosis, asymptomatic 01/19/2018   1-39% on carotid Doppler 2015.    CARPAL TUNNEL SYNDROME, LEFT 02/15/2009   Qualifier: Diagnosis of  By: Wynona Luna    Chronic interstitial lung disease ([place])    PULMONARY INFILTRATE  --  PULMOLOGIST-  DR CLANCE   Chronic steroid use    INTERSTITIAL LUNG DISEASE   COLONIC POLYPS, HX OF 12/23/2006   Qualifier: Diagnosis of  By: Marca Ancona RMA, Lucy     Complication of anesthesia    EMERGENCE COMBATIVENESS---  PLEASE REFER TO VATS 01-05-1012 PROCEDURE ,  DR Linna Caprice DOCUMENTED GRADE IV DIFFICULT VISUAL AIRWAY   DIABETES MELLITUS, TYPE II, BORDERLINE 11/14/2008   Qualifier: Diagnosis of  By:  Shawna Orleans DO, DHerbie Baltimore    Dyspnea on exertion    Elevated PSA 08/13/2011   GERD 12/23/2006   Qualifier: Diagnosis of  By: Marca Ancona RMA, Lucy     History of anal fissures    History of hypothyroidism    Hyperlipidemia 05/01/2016   Hypothyroidism 12/23/2006   Qualifier: Diagnosis of  By: Larose Kells     MEDIASTINAL LYMPHADENOPATHY 08/30/2007   Qualifier: Diagnosis of  By: Joya Gaskins MD, Burnett Harry     Mild ascending aorta dilatation ([place]) 01/19/2018   Oral thrush 04/22/2012   OSA (obstructive sleep apnea) 12/23/2006   Pt prefers to stay on auto setting.     Pneumonia 11/13/2011   Prostate cancer ([place]) 06/09/12 bx   Adenocarcinoma   PULMONARY EDEMA 07/05/2007   Qualifier: Diagnosis of  By: Joya Gaskins MD, Burnett Harry    Pulmonary infiltrate 12/23/2011   H/o LN, pericardial effusion, pleural effusion unknown origin 2009.  ?resolved with prednisone?? Mediastinoscopy 2009:  Benign lymphoid hyperplasia Ct chest 11/2011: scattered GGO, interstitial changes, small pulmonary nodules, mild LN No response to prolonged course of abx. Autoimmune w/u:  ESR 78, but ACE/RF/DSDNA/CCP/ENA/SCL70/HP panel/ANCA all neg.  ANA low positive (prob neg). VATS bx 12/2011:  CIP with organizing pna.  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  V/Q 01/2012:  Ok +++ response to steroids (started 12/2011) PFT's 12/2012:  No obstruction, TLC 3.25 (42%), DLCO 44% Pulmonary rehab 2014 xrays and symptoms greatly improved with steroids, and no recurrence off steroids    RHINOSINUSITIS, ACUTE 01/29/2010   Qualifier: Diagnosis of  By: Fanny Skates PAIN, LEFT 11/14/2008   Qualifier: Diagnosis of  By: Wynona Luna    Sinusitis 11/26/2010   Sleep apnea    on cpap   Thrombocytopenia [facility])     Past Surgical History:  Procedure Laterality Date   CARDIOVASCULAR STRESS TEST  01-10-2008   NORMAL EXERCISE STRESS TEST AT GOOD WORKLOAD   COLONOSCOPY  01-15-2009   polyps and tics   LUNG BIOPSY     MEDIASTINOSCOPY  07-12-2007   BILATERAL PLEURAL EFFUSIONS   NASAL SINUS SURGERY     POLYPECTOMY     prostate biopsy  06/11/12   Adenocarcinoma   RADIOACTIVE SEED IMPLANT N/A 12/23/2012   Procedure: RADIOACTIVE SEED IMPLANT;  Surgeon: Franchot Gallo, MD;  Location: [facility];  Service: Urology;  Laterality: N/A;   81   seeds implanted    TRANSTHORACIC ECHOCARDIOGRAM  12-31-2011   NORMAL LVF/   EF  55-60%   VIDEO  ASSISTED THORACOSCOPY (VATS)/ LYMPH NODE SAMPLING Left 01-05-2012   LUNG AND LYMPH NODE BX'S (CHRONIC INTERSTITIAL PNEUMONIA)     Current Outpatient Medications  Medication Sig Dispense Refill   albuterol (VENTOLIN HFA) 108 (90 Base) MCG/ACT inhaler INHALE 2 PUFFS BY MOUTH FOUR TIMES A DAY FOR LUNG IRRITATION, BREATHING, LUNG SCARRING, MILD ASTHMA.  TAKE 2 PUFFS  WHILE STANDING UP WHEN NEEDED UP TO 4 TIMES A DAY. FOR LUNG IRRITATION, BREATHING, LUNG SCARRING, MILD ASTHMA.  TAKE 2 PUFFS   WHILE STANDING UP WHEN NEEDED UP TO 4 TIMES A DAY.     atorvastatin (LIPITOR) 10 MG tablet Take 1 tablet (10 mg total) by mouth daily. 30 tablet 3   Azelastine HCl (ASTEPRO) 0.15 % SOLN Place 1 spray into the nose 2 (two) times daily. 30 mL ML   Cholecalciferol 50 MCG (2000 UT) TABS TAKE ONE TABLET BY MOUTH DAILY FOR LOW VITAMIN D  fluticasone (FLONASE) 50 MCG/ACT nasal spray INSTILL 2 SPRAYS IN EACH NOSTRIL DAILY FOR ALLERGIC RHINITIS, ALLERGIES, NASAL CONGESTION.     Ibuprofen 200 MG CAPS Take 600 mg by mouth as needed.     No current facility-administered medications for this visit.    Allergies:   Ivp dye [iodinated contrast media], Doxycycline, and Lactose intolerance (gi)    Social History:  The patient  reports that he quit smoking about 35 years ago. His smoking use included cigarettes and cigars. He has a 0.20 pack-year smoking history. He has never used smokeless tobacco. He reports current alcohol use of about 2.0 standard drinks per week. He reports that he does not use drugs.   Family History:  The patient's family history includes Alzheimer's disease in his maternal grandmother and mother; Bradycardia in his father; Cancer in his paternal uncle; Colon cancer in his father; Diabetes in his daughter; Heart attack in his father; Hyperlipidemia in his sister; Hypertension in his mother; Nephrolithiasis in his father; Prostate cancer in his father; Sinusitis in his brother.    ROS:   Please see  the history of present illness.   (+) Dizziness (+) Sinus congestion All other systems are reviewed and negative.    Physical Exam: VS:  BP 118/74   Pulse 75   Ht 6' 1.5" (1.867 m)   Wt 254 lb 11.2 oz (115.5 kg)   BMI 33.15 kg/m  , BMI Body mass index is 33.15 kg/m. GENERAL:  Well appearing HEENT: Pupils equal round and reactive, fundi not visualized, oral mucosa unremarkable NECK:  No jugular venous distention, waveform within normal limits, carotid upstroke brisk and symmetric, no bruits LUNGS:  Clear to auscultation bilaterally HEART:  RRR.  PMI not displaced or sustained,S1 and S2 within normal limits, no S3, no S4, no clicks, no INOM,7/6 systolic murmur ABD:  Flat, positive bowel sounds normal in frequency in pitch, no bruits, no rebound, no guarding, no midline pulsatile mass, no hepatomegaly, no splenomegaly EXT:  2 plus pulses throughout, no edema, no cyanosis no clubbing SKIN:  No rashes no nodules NEURO:  Cranial nerves II through XII grossly intact, motor grossly intact throughout PSYCH:  Cognitively intact, oriented to person place and time   EKG:   EKG is personally reviewed. 09/02/2021:  EKG was not ordered. 03/04/21: Sinus rhythm, rate 72 bpm; LAD 01/19/2020: Sinus rhythm.  Rate 81 bpm.  Incomplete right bundle branch block.  LVH. 01/11/2019: Sinus rhythm.  Rate 74 bpm.  Left axis deviation. 01/19/18: sinus rhythm.  Rate 78 bpm.  RSR' in V1.  LVH.   Echocardiogram  03/18/2021: Sonographer Comments: Suboptimal apical window and patient is morbidly obese. Image acquisition challenging due to patient body habitus and Image acquisition challenging due to respiratory motion. IV attempt was made for Definity, unsuccessful.  IMPRESSIONS    1. Left ventricular ejection fraction, by estimation, is 60 to 65%. The  left ventricle has normal function. The left ventricle has no regional  wall motion abnormalities. Left ventricular diastolic parameters were  normal.   2. Right  ventricular systolic function is normal. The right ventricular  size is normal.   3. Left atrial size was mild to moderately dilated.   4. The mitral valve is grossly normal. Mild mitral valve regurgitation.   5. The aortic valve is tricuspid. There is mild calcification of the  aortic valve. There is mild thickening of the aortic valve. Aortic valve  regurgitation is mild. Mild aortic valve stenosis.   Comparison(s): EF  60%, mild AI.   CT Chest High Resolution  12/05/2020: FINDINGS: Cardiovascular: Heart size is normal. There is no significant pericardial fluid, thickening or pericardial calcification. There is aortic atherosclerosis, as well as atherosclerosis of the great vessels of the mediastinum and the coronary arteries, including calcified atherosclerotic plaque in the left anterior descending and right coronary arteries. Calcifications of the aortic valve.   Mediastinum/Nodes: Multiple prominent but nonenlarged anterior mediastinal lymph nodes, similar to numerous prior examinations dating back to 2013, nonspecific, but presumably benign. No pathologically enlarged mediastinal or hilar lymph nodes. Please note that accurate exclusion of hilar adenopathy is limited on noncontrast CT scans. Esophagus is unremarkable in appearance. No axillary lymphadenopathy.   Lungs/Pleura: High-resolution images demonstrates some very mild ground-glass attenuation and septal thickening most evident in the lower lobes of the lungs bilaterally. Mild cylindrical bronchiectasis also noted, also most evident in the lower lobes of the lungs bilaterally. No subpleural reticulation, and no honeycombing. Inspiratory and expiratory imaging is unremarkable. Postoperative changes of open lung biopsy in the periphery of the left lower lobe. No acute consolidative airspace disease. No pleural effusions. No suspicious appearing pulmonary nodules or masses are noted.   Upper Abdomen: Small  low-attenuation lesions in the liver, incompletely characterized on today's non-contrast CT examination but measuring up to 1.3 cm in segment 2 of the liver, likely to represent small cysts.   Musculoskeletal: There are no aggressive appearing lytic or blastic lesions noted in the visualized portions of the skeleton.   IMPRESSION: 1. As with prior studies, the predominant feature on today's examination is mild cylindrical bronchiectasis. Today's study does demonstrate some very mild ground-glass attenuation in the lower lobes of the lungs. This is nonspecific, but the possibility of developing interstitial lung disease is not entirely excluded. Repeat high-resolution chest CT is recommended in 12 months to assess for further temporal changes in the appearance of the lung parenchyma if clinically appropriate. 2. Aortic atherosclerosis, in addition to 2 vessel coronary artery disease. Please assessment for potential risk factor modification, dietary therapy or pharmacologic therapy may be warranted, if clinically indicated. 3. There are calcifications of the aortic valve. Echocardiographic correlation for evaluation of potential valvular dysfunction may be warranted if clinically indicated.   Aortic Atherosclerosis (ICD10-I70.0).   Cardiopulmonary Exercise Test 06/20/19 Pre-Exercise PFTs   FVC 2.77 (67%)       FEV1 2.34 (75%)         FEV1/FVC 85 (111%)         MVV 100 (70%)   Post-Exercise PFTs  (from lowest post-exercise trial (%change from rest)) FVC performed IPE, 5, 10, 15 mins   FVC 2.72 (-1%) 15 mins       FEV1 2.22 (-5%) 15 mins     FEV1/FVC 80 (-6%) 5 mins       Notes: Patient gave a very good effort. Pulse-oximetry remained 97-98% for the duration of exercise. Exercise was performed on a cycle ergometer starting at [facility] and increasing by 12W/min.   Conclusion: Exercise testing with gas exchange demonstrates moderate functional impairment when compared to matched  sedentary norms. Patient's primary limitation appears secondary to body habitus and deconditioning. She has restriction on spirometry but had adequate breathing reserve at peak exercise. There was also chronotropic incompetence at peak exercise, which is possibly contributing to exercise intolerance.  Echo 01/26/18: Study Conclusions - Left ventricle: The cavity size was normal. Wall thickness was   normal. Systolic function was normal. The estimated ejection   fraction was in the  range of 60% to 65%. Wall motion was normal;   there were no regional wall motion abnormalities. Features are   consistent with a pseudonormal left ventricular filling pattern,   with concomitant abnormal relaxation and increased filling   pressure (grade 2 diastolic dysfunction). - Aortic valve: Mildly calcified annulus. There was mild   regurgitation.   Recent Labs: 03/04/2021: Magnesium 2.0 05/28/2021: ALT 12; BUN 9; Creatinine, Ser 0.69; Hemoglobin 13.8; Platelets 143.0; Potassium 4.2; Sodium 133; TSH 2.42    Lipid Panel    Component Value Date/Time   CHOL 203 (H) 05/28/2021 1100   CHOL 133 03/04/2021 0941   TRIG 69.0 05/28/2021 1100   HDL 59.90 05/28/2021 1100   HDL 56 03/04/2021 0941   CHOLHDL 3 05/28/2021 1100   VLDL 13.8 05/28/2021 1100   LDLCALC 129 (H) 05/28/2021 1100   LDLCALC 64 03/04/2021 0941      Wt Readings from Last 3 Encounters:  09/02/21 254 lb 11.2 oz (115.5 kg)  05/28/21 245 lb 12.8 oz (111.5 kg)  05/21/21 252 lb (114.3 kg)      ASSESSMENT AND PLAN:  Carotid stenosis, asymptomatic Resolved on repeat imaging in 2019.  Continue with lipid management.  Hyperlipidemia He is tolerating atorvastatin much better.  He will come back for fasting lipids and a CMP.  Given that he has coronary calcification his LDL goal is less than 70.  Aortic stenosis Mild aortic stenosis on echo 02/2021.  Mean gradient was 12 mmHg.  Repeat echo in a year.   Current medicines are reviewed at  length with the patient today.  The patient does not have concerns regarding medicines.  The following changes have been made:  no change  Labs/ tests ordered today include:   Orders Placed This Encounter  Procedures   Lipid panel   Comprehensive metabolic panel     Disposition:   FU with [place] Catarino C. Oval Linsey, MD, [facility] in 1 year.    I,Mathew Stumpf,acting as a Education administrator for Skeet Latch, MD.,have documented all relevant documentation on the behalf of Skeet Latch, MD,as directed by  Skeet Latch, MD while in the presence of Skeet Latch, MD.  I, [place] Oval Linsey, MD have reviewed all documentation for this visit.  The documentation of the exam, diagnosis, procedures, and orders on 09/02/2021 are all accurate and complete.  Signed, Mical Kicklighter C. Oval Linsey, MD, [facility]  09/02/2021 10:50 AM    [place]

## 2021-09-02 NOTE — Assessment & Plan Note (Signed)
Mild aortic stenosis on echo 02/2021.  Mean gradient was 12 mmHg.  Repeat echo in a year.

## 2021-09-02 NOTE — Assessment & Plan Note (Addendum)
He is tolerating atorvastatin much better.  He will come back for fasting lipids and a CMP.  Given that he has coronary calcification his LDL goal is less than 70.

## 2021-09-16 DIAGNOSIS — E785 Hyperlipidemia, unspecified: Secondary | ICD-10-CM | POA: Diagnosis not present

## 2021-09-16 DIAGNOSIS — Z5181 Encounter for therapeutic drug level monitoring: Secondary | ICD-10-CM | POA: Diagnosis not present

## 2021-09-16 LAB — COMPREHENSIVE METABOLIC PANEL
ALT: 14 IU/L (ref 0–44)
AST: 20 IU/L (ref 0–40)
Albumin/Globulin Ratio: 1.4 (ref 1.2–2.2)
Albumin: 4.1 g/dL (ref 3.7–4.7)
Alkaline Phosphatase: 62 IU/L (ref 44–121)
BUN/Creatinine Ratio: 16 (ref 10–24)
BUN: 11 mg/dL (ref 8–27)
Bilirubin Total: 0.5 mg/dL (ref 0.0–1.2)
CO2: 26 mmol/L (ref 20–29)
Calcium: 9.6 mg/dL (ref 8.6–10.2)
Chloride: 100 mmol/L (ref 96–106)
Creatinine, Ser: 0.67 mg/dL — ABNORMAL LOW (ref 0.76–1.27)
Globulin, Total: 2.9 g/dL (ref 1.5–4.5)
Glucose: 89 mg/dL (ref 70–99)
Potassium: 4.3 mmol/L (ref 3.5–5.2)
Sodium: 137 mmol/L (ref 134–144)
Total Protein: 7 g/dL (ref 6.0–8.5)
eGFR: 99 mL/min/{1.73_m2} (ref >59)

## 2021-09-16 LAB — LIPID PANEL
Chol/HDL Ratio: 2.3 ratio (ref 0.0–5.0)
Cholesterol, Total: 139 mg/dL (ref 100–199)
HDL: 61 mg/dL (ref >39)
LDL Chol Calc (NIH): 67 mg/dL (ref 0–99)
Triglycerides: 48 mg/dL (ref 0–149)
VLDL Cholesterol Cal: 11 mg/dL (ref 5–40)

## 2021-09-30 ENCOUNTER — Other Ambulatory Visit (HOSPITAL_BASED_OUTPATIENT_CLINIC_OR_DEPARTMENT_OTHER): Payer: Self-pay | Admitting: Cardiovascular Disease

## 2021-10-11 DIAGNOSIS — J019 Acute sinusitis, unspecified: Secondary | ICD-10-CM | POA: Diagnosis not present

## 2021-10-21 ENCOUNTER — Other Ambulatory Visit: Payer: Self-pay | Admitting: Pulmonary Disease

## 2021-10-22 MED ORDER — AZELASTINE HCL 0.15 % NA SOLN: 1.0000 | Freq: Two times a day (BID) | NASAL | 2 refills | Status: DC

## 2021-12-10 ENCOUNTER — Ambulatory Visit (HOSPITAL_BASED_OUTPATIENT_CLINIC_OR_DEPARTMENT_OTHER)
Admission: RE | Admit: 2021-12-10 | Discharge: 2021-12-10 | Disposition: A | Discharge status: Home / Self Care | Payer: Medicare Other | Source: Ambulatory Visit | Attending: Internal Medicine | Admitting: Internal Medicine

## 2021-12-10 DIAGNOSIS — J479 Bronchiectasis, uncomplicated: Secondary | ICD-10-CM | POA: Insufficient documentation

## 2021-12-10 DIAGNOSIS — I700 Atherosclerosis of aorta: Secondary | ICD-10-CM | POA: Diagnosis not present

## 2021-12-10 DIAGNOSIS — R0602 Shortness of breath: Secondary | ICD-10-CM | POA: Insufficient documentation

## 2021-12-10 DIAGNOSIS — I2510 Atherosclerotic heart disease of native coronary artery without angina pectoris: Secondary | ICD-10-CM | POA: Diagnosis not present

## 2021-12-10 DIAGNOSIS — R918 Other nonspecific abnormal finding of lung field: Secondary | ICD-10-CM | POA: Diagnosis not present

## 2021-12-16 ENCOUNTER — Other Ambulatory Visit (HOSPITAL_BASED_OUTPATIENT_CLINIC_OR_DEPARTMENT_OTHER): Payer: Medicare Other

## 2021-12-17 ENCOUNTER — Ambulatory Visit (INDEPENDENT_AMBULATORY_CARE_PROVIDER_SITE_OTHER): Payer: Medicare Other | Admitting: Cardiovascular Disease

## 2021-12-17 ENCOUNTER — Encounter (HOSPITAL_BASED_OUTPATIENT_CLINIC_OR_DEPARTMENT_OTHER): Payer: Self-pay | Admitting: Cardiovascular Disease

## 2021-12-17 VITALS — BP 112/74 | HR 75 | Ht 74.0 in | Wt 256.2 lb

## 2021-12-17 DIAGNOSIS — Z8669 Personal history of other diseases of the nervous system and sense organs: Secondary | ICD-10-CM

## 2021-12-17 DIAGNOSIS — Z79899 Other long term (current) drug therapy: Secondary | ICD-10-CM

## 2021-12-17 DIAGNOSIS — I6523 Occlusion and stenosis of bilateral carotid arteries: Secondary | ICD-10-CM

## 2021-12-17 DIAGNOSIS — R002 Palpitations: Secondary | ICD-10-CM | POA: Diagnosis not present

## 2021-12-17 DIAGNOSIS — E785 Hyperlipidemia, unspecified: Secondary | ICD-10-CM | POA: Diagnosis not present

## 2021-12-17 DIAGNOSIS — R252 Cramp and spasm: Secondary | ICD-10-CM | POA: Diagnosis not present

## 2021-12-17 HISTORY — DX: Palpitations: R00.2

## 2021-12-17 NOTE — Patient Instructions (Addendum)
Medication Instructions:  Continue current medications  *If you need a refill on your cardiac medications before your next appointment, please call your pharmacy*   Lab Work: CMP, TSH, CBC and Magnesium  If you have labs (blood work) drawn today and your tests are completely normal, you will receive your results only by: [place] (if you have MyChart) OR A paper copy in the mail If you have any lab test that is abnormal or we need to change your treatment, we will call you to review the results.   Testing/Procedures: Your physician has recommended that you wear an event monitor for 30 days. Event monitors are medical devices that record the heart's electrical activity. Doctors most often [place] these monitors to diagnose arrhythmias. Arrhythmias are problems with the speed or rhythm of the heartbeat. The monitor is a small, portable device. You can wear one while you do your normal daily activities. This is usually used to diagnose what is causing palpitations/syncope (passing out).   Follow-Up: At [facility], you and your health needs are our priority.  As part of our continuing mission to provide you with exceptional heart care, we have created designated Provider Care Teams.  These Care Teams include your primary Cardiologist (physician) and Advanced Practice Providers (APPs -  Physician Assistants and Nurse Practitioners) who all work together to provide you with the care you need, when you need it.  We recommend signing up for the patient portal called "MyChart".  Sign up information is provided on this After Visit Summary.  MyChart is used to connect with patients for Virtual Visits (Telemedicine).  Patients are able to view lab/test results, encounter notes, upcoming appointments, etc.  Non-urgent messages can be sent to your provider as well.   To learn more about what you can do with MyChart, go to [email or web address].    Your next appointment:   6-8  week(s)  The format for your next appointment:   In Person  Provider:   Laurann Montana, NP    Other Instructions  [employer] Cardiac Event Monitor Instructions  Your physician has requested you wear your cardiac event monitor for 30 days, (1-30). [employer] may call or text to confirm a shipping address. The monitor will be sent to a land address via UPS. [employer] will not ship a monitor to a PO BOX. It typically takes 3-5 days to receive your monitor after it has been enrolled. [employer] will assist with USPS tracking if your package is delayed. The telephone number for [employer] is [phone]. Once you have received your monitor, please review the enclosed instructions. Instruction tutorials can also be viewed under help and settings on the enclosed cell phone. Your monitor has already been registered assigning a specific monitor serial # to you.  Applying the monitor Remove cell phone from case and turn it on. The cell phone works as Dealer and needs to be within [employer] of you at all times. The cell phone will need to be charged on a daily basis. We recommend you plug the cell phone into the enclosed charger at your bedside table every night.  Monitor batteries: You will receive two monitor batteries labelled #1 and #2. These are your recorders. Plug battery #2 onto the second connection on the enclosed charger. Keep one battery on the charger at all times. This will keep the monitor battery deactivated. It will also keep it fully charged for when you need to switch your monitor batteries. A small light will be blinking  on the battery emblem when it is charging. The light on the battery emblem will remain on when the battery is fully charged.  Open package of a Monitor strip. Insert battery #1 into black hood on strip and gently squeeze monitor battery onto connection as indicated in instruction booklet. Set aside while preparing skin.  Choose location for  your strip, vertical or horizontal, as indicated in the instruction booklet. Shave to remove all hair from location. There cannot be any lotions, oils, powders, or colognes on skin where monitor is to be applied. Wipe skin clean with enclosed Saline wipe. Dry skin completely.  Peel paper labeled #1 off the back of the Monitor strip exposing the adhesive. Place the monitor on the chest in the vertical or horizontal position shown in the instruction booklet. One arrow on the monitor strip must be pointing upward. Carefully remove paper labeled #2, attaching remainder of strip to your skin. Try not to create any folds or wrinkles in the strip as you apply it.  Firmly press and release the circle in the center of the monitor battery. You will hear a small beep. This is turning the monitor battery on. The heart emblem on the monitor battery will light up every 5 seconds if the monitor battery in turned on and connected to the patient securely. Do not push and hold the circle down as this turns the monitor battery off. The cell phone will locate the monitor battery. A screen will appear on the cell phone checking the connection of your monitor strip. This may read poor connection initially but change to good connection within the next minute. Once your monitor accepts the connection you will hear a series of 3 beeps followed by a climbing crescendo of beeps. A screen will appear on the cell phone showing the two monitor strip placement options. Touch the picture that demonstrates where you applied the monitor strip.  Your monitor strip and battery are waterproof. You are able to shower, bathe, or swim with the monitor on. They just ask you do not submerge deeper than 3 feet underwater. We recommend removing the monitor if you are swimming in a lake, river, or ocean.  Your monitor battery will need to be switched to a fully charged monitor battery approximately once a week. The cell phone will alert  you of an action which needs to be made.  On the cell phone, tap for details to reveal connection status, monitor battery status, and cell phone battery status. The green dots indicates your monitor is in good status. A red dot indicates there is something that needs your attention.  To record a symptom, click the circle on the monitor battery. In 30-60 seconds a list of symptoms will appear on the cell phone. Select your symptom and tap save. Your monitor will record a sustained or significant arrhythmia regardless of you clicking the button. Some patients do not feel the heart rhythm irregularities. [employer] will notify us of any serious or critical events.  Refer to instruction booklet for instructions on switching batteries, changing strips, the Do not disturb or Pause features, or any additional questions.  Call [employer] at [phone], to confirm your monitor is transmitting and record your baseline. They will answer any questions you may have regarding the monitor instructions at that time.  Returning the monitor to [place] all equipment back into blue box. Peel off strip of paper to expose adhesive and close box securely. There is a prepaid UPS shipping label on  this box. Drop in a UPS drop box, or at a UPS facility like [employer]. You may also contact [employer] to arrange UPS to pick up monitor package at your home.

## 2021-12-17 NOTE — Assessment & Plan Note (Signed)
Lipids are well controlled and he is tolerating atorvastatin.

## 2021-12-17 NOTE — Assessment & Plan Note (Signed)
He tolerates his CPAP well.

## 2021-12-17 NOTE — Assessment & Plan Note (Signed)
In the last month [name] has been experiencing palpitations.  This is new for him.  It is happened several times and only occurs when he is sleeping.  He has been using his CPAP faithfully.  He denies GERD or anxiety.  We will check a CBC, magnesium, TSH, and CMP.  We will also have him wear a 30-day monitor to better assess the underlying rhythm.

## 2021-12-17 NOTE — Progress Notes (Signed)
Cardiology Office Note  Date:  12/17/2021   ID:  Chad [name], DOB [birth year], MRN [id number]  PCP:  Isaac Bliss, Rayford Halsted, MD  Cardiologist:   Chad Latch, MD   No chief complaint on file.    History of Present Illness: Chad [name] is a 70 y.o. male with chronic diastolic heart failure, asymptomatic coronary calcifications, aortic stenosis, carotid stenosis, interstitial lung disease/pulmonary fibrosis, OSA on CPAP, hyperlipidemia, and diabetes who presents for follow-up.  He initially saw Dr. Curt [name] 10/2015.  He was referred because of a CT scan that showed coronary calcifications and calcified aortic valve.  At the time he was completely asymptomatic.  Fasting lipids revealed an LDL of 126.  He had an echo 12/2011 that revealed LVEF 55 to 60%, and a mildly dilated ascending aorta.  Carotid Dopplers 11/2013 revealed 1-39% stenosis bilaterally.  He had a CPX 02/2016 that revealed mild functional impairment in gas exchange and mild restrictive physiology.  It was felt that his imitations at peak exercise were mostly attributed to body habitus.  He had a repeat echo 12/2017 that showed LVEF 60-65% with grade 2 diastolic dysfunction and no aortic aneurysm.  He complained of intermittent dizzy spells. He had a cardiopulmonary exercise test with Dr. Chase [name] on 05/2019 that revealed moderate functional impairment mostly due to body habitus and deconditioning. There was also some chronotropic incompetence and restriction on spirometry. When he last saw his pulmonologist, his dyspnea was determined to be out of proportion of his coronary disease. Chest CT on 11/2020 showed mild cyclindrical bronchiectasis, calcification in the aorta, 2 vessel coronary disease, and calcification of the aortic valve.  He was working on lifestyle changes with increased exercise and a plant based diet. He noted left leg cramping. Atorvastatin was reduced with plans to start PCSK9 inhibitor if his lipids remained  above goal.  Repeat Echo 02/2021 revealed mild AS (mean gradient 12 mm Hg); descending aorta was not dilated.   Today, he reports feeling pounding palpitations that are waking him up at night. He has had about 4 episodes within the past month. The episodes do not last long because he usually gets up and walks around or goes to the restroom. His palpitations never occur during the day. He is using his CPAP every night. Lately he has fallen out of the habit of formal exercise a few weeks ago. He stays busy with yard work. He denies any chest pain, shortness of breath, or peripheral edema. No lightheadedness, headaches, syncope, orthopnea, or PND.   Past Medical History:  Diagnosis Date   Anemia    'YEARS AGO"   Aortic stenosis 09/02/2021   Aortic valve calcification 03/04/2021   Arthritis    ASTHMA 02/11/2007        CARBUNCLE AND FURUNCLE OF LEG EXCEPT FOOT 08/14/2009   Qualifier: Diagnosis of  By: Wynona Luna    Carotid stenosis, asymptomatic 01/19/2018   1-39% on carotid Doppler 2015.    CARPAL TUNNEL SYNDROME, LEFT 02/15/2009   Qualifier: Diagnosis of  By: Wynona Luna    Chronic interstitial lung disease ([place])    PULMONARY INFILTRATE  --  PULMOLOGIST-  DR CLANCE   Chronic steroid use    INTERSTITIAL LUNG DISEASE   COLONIC POLYPS, HX OF 12/23/2006   Qualifier: Diagnosis of  By: Marca Ancona RMA, Lucy     Complication of anesthesia    EMERGENCE COMBATIVENESS---  PLEASE REFER TO VATS 01-05-1012 PROCEDURE ,  DR Linna Caprice DOCUMENTED GRADE IV DIFFICULT  VISUAL AIRWAY   DIABETES MELLITUS, TYPE II, BORDERLINE 11/14/2008   Qualifier: Diagnosis of  By: Wynona Luna    Dyspnea on exertion    Elevated PSA 08/13/2011   GERD 12/23/2006   Qualifier: Diagnosis of  By: Marca Ancona RMA, Lucy     History of anal fissures    History of hypothyroidism    Hyperlipidemia 05/01/2016   Hypothyroidism 12/23/2006   Qualifier: Diagnosis of  By: Larose Kells     MEDIASTINAL LYMPHADENOPATHY 08/30/2007   Qualifier:  Diagnosis of  By: Joya Gaskins MD, Burnett Harry    Mild ascending aorta dilatation ([place]) 01/19/2018   Oral thrush 04/22/2012   OSA (obstructive sleep apnea) 12/23/2006   Pt prefers to stay on auto setting.     Palpitations 12/17/2021   Pneumonia 11/13/2011   Prostate cancer ([place]) 06/09/12 bx   Adenocarcinoma   PULMONARY EDEMA 07/05/2007   Qualifier: Diagnosis of  By: Joya Gaskins MD, Burnett Harry    Pulmonary infiltrate 12/23/2011   H/o LN, pericardial effusion, pleural effusion unknown origin 2009.  ?resolved with prednisone?? Mediastinoscopy 2009:  Benign lymphoid hyperplasia Ct chest 11/2011: scattered GGO, interstitial changes, small pulmonary nodules, mild LN No response to prolonged course of abx. Autoimmune w/u:  ESR 78, but ACE/RF/DSDNA/CCP/ENA/SCL70/HP panel/ANCA all neg.  ANA low positive (prob neg). VATS bx 12/2011:  CIP with organizing pna.  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  V/Q 01/2012:  Ok +++ response to steroids (started 12/2011) PFT's 12/2012:  No obstruction, TLC 3.25 (42%), DLCO 44% Pulmonary rehab 2014 xrays and symptoms greatly improved with steroids, and no recurrence off steroids    RHINOSINUSITIS, ACUTE 01/29/2010   Qualifier: Diagnosis of  By: Fanny Skates PAIN, LEFT 11/14/2008   Qualifier: Diagnosis of  By: Wynona Luna    Sinusitis 11/26/2010   Sleep apnea    on cpap   Thrombocytopenia [facility])     Past Surgical History:  Procedure Laterality Date   CARDIOVASCULAR STRESS TEST  01-10-2008   NORMAL EXERCISE STRESS TEST AT GOOD WORKLOAD   COLONOSCOPY  01-15-2009   polyps and tics   LUNG BIOPSY     MEDIASTINOSCOPY  07-12-2007   BILATERAL PLEURAL EFFUSIONS   NASAL SINUS SURGERY     POLYPECTOMY     prostate biopsy  06/11/12   Adenocarcinoma   RADIOACTIVE SEED IMPLANT N/A 12/23/2012   Procedure: RADIOACTIVE SEED IMPLANT;  Surgeon: Franchot Gallo, MD;  Location: [facility];  Service: Urology;  Laterality: N/A;   81   seeds implanted     TRANSTHORACIC ECHOCARDIOGRAM  12-31-2011   NORMAL LVF/   EF  55-60%   VIDEO ASSISTED THORACOSCOPY (VATS)/ LYMPH NODE SAMPLING Left 01-05-2012   LUNG AND LYMPH NODE BX'S (CHRONIC INTERSTITIAL PNEUMONIA)     Current Outpatient Medications  Medication Sig Dispense Refill   albuterol (VENTOLIN HFA) 108 (90 Base) MCG/ACT inhaler INHALE 2 PUFFS BY MOUTH FOUR TIMES A DAY FOR LUNG IRRITATION, BREATHING, LUNG SCARRING, MILD ASTHMA.  TAKE 2 PUFFS  WHILE STANDING UP WHEN NEEDED UP TO 4 TIMES A DAY. FOR LUNG IRRITATION, BREATHING, LUNG SCARRING, MILD ASTHMA.  TAKE 2 PUFFS   WHILE STANDING UP WHEN NEEDED UP TO 4 TIMES A DAY.     atorvastatin (LIPITOR) 10 MG tablet TAKE 1 TABLET BY MOUTH EVERY DAY 90 tablet 1   Azelastine HCl (ASTEPRO) 0.15 % SOLN Place 1 spray into the nose 2 (two) times daily. 30 mL 2  Cholecalciferol 50 MCG (2000 UT) TABS TAKE ONE TABLET BY MOUTH DAILY FOR LOW VITAMIN D     doxepin (SINEQUAN) 25 MG capsule Take 1 capsule by mouth as needed.     fluticasone (FLONASE) 50 MCG/ACT nasal spray INSTILL 2 SPRAYS IN EACH NOSTRIL DAILY FOR ALLERGIC RHINITIS, ALLERGIES, NASAL CONGESTION.     Ibuprofen 200 MG CAPS Take 600 mg by mouth as needed.     No current facility-administered medications for this visit.    Allergies:   Ivp dye [iodinated contrast media], Doxycycline, and Lactose intolerance (gi)    Social History:  The patient  reports that he quit smoking about 35 years ago. His smoking use included cigarettes and cigars. He has a 0.20 pack-year smoking history. He has never used smokeless tobacco. He reports current alcohol use of about 2.0 standard drinks of alcohol per week. He reports that he does not use drugs.   Family History:  The patient's family history includes Alzheimer's disease in his maternal grandmother and mother; Bradycardia in his father; Cancer in his paternal uncle; Colon cancer in his father; Diabetes in his daughter; Heart attack in his father; Hyperlipidemia in his  sister; Hypertension in his mother; Nephrolithiasis in his father; Prostate cancer in his father; Sinusitis in his brother.    ROS:   Please see the history of present illness.   (+) Palpitations All other systems are reviewed and negative.    Physical Exam: VS:  BP 112/74 (BP Location: Left Arm, Patient Position: Sitting, Cuff Size: Large)   Pulse 75   Ht _0  (1.88 m)   Wt 256 lb 3.2 oz (116.2 kg)   BMI 32.89 kg/m  , BMI Body mass index is 32.89 kg/m. GENERAL:  Well appearing HEENT: Pupils equal round and reactive, fundi not visualized, oral mucosa unremarkable NECK:  No jugular venous distention, waveform within normal limits, carotid upstroke brisk and symmetric, no bruits LUNGS:  Clear to auscultation bilaterally HEART:  RRR.  PMI not displaced or sustained,S1 and S2 within normal limits, no S3, no S4, no clicks, no rubs, 2/6 systolic murmur ABD:  Flat, positive bowel sounds normal in frequency in pitch, no bruits, no rebound, no guarding, no midline pulsatile mass, no hepatomegaly, no splenomegaly EXT:  2 plus pulses throughout, no edema, no cyanosis no clubbing SKIN:  No rashes no nodules NEURO:  Cranial nerves II through XII grossly intact, motor grossly intact throughout PSYCH:  Cognitively intact, oriented to person place and time   EKG:   EKG is personally reviewed. 12/17/2021:  Sinus rhythm. Rate 75 bpm. Incomplete RBBB. 09/02/2021:  EKG was not ordered. 03/04/21: Sinus rhythm, rate 72 bpm; LAD 01/19/2020: Sinus rhythm.  Rate 81 bpm.  Incomplete right bundle branch block.  LVH. 01/11/2019: Sinus rhythm.  Rate 74 bpm.  Left axis deviation. 01/19/18: sinus rhythm.  Rate 78 bpm.  RSR' in V1.  LVH.   CT Chest  12/10/2021: FINDINGS: Cardiovascular: Atherosclerotic calcification of the aorta, aortic valve and coronary arteries. Heart is at the upper limits of normal in size to mildly enlarged. No pericardial effusion.   Mediastinum/Nodes: Subcentimeter short axis  thoracic inlet lymph nodes, as before. Nodularity in the prevascular space, chronically present. Hilar regions are difficult to evaluate without IV contrast. No axillary adenopathy. Esophagus is grossly unremarkable.   Lungs/Pleura: Postoperative scarring in the left lower lobe. Cylindrical bronchiectasis. Additional scattered subpleural scarring. Negative for subpleural reticulation, traction bronchiectasis/bronchiolectasis, ground-glass, architectural distortion or honeycombing. No pleural fluid. Airway is unremarkable.  Minimal air trapping.   Upper Abdomen: Subcentimeter low-attenuation lesion in the right hepatic lobe is too small to characterize. Visualized portions of the liver, gallbladder, adrenal glands, kidneys, spleen, pancreas, stomach and bowel are otherwise unremarkable.   Musculoskeletal: Degenerative changes in the spine. Bridging bone between the left sixth and seventh posterolateral ribs. Flowing anterior osteophytosis in the thoracic spine.   IMPRESSION: 1. Mild cylindrical bronchiectasis. No evidence of fibrotic interstitial lung disease. 2. Mild air trapping, indicative of small airways disease. 3. Aortic atherosclerosis (ICD10-I70.0). Coronary artery calcification.  Echocardiogram  03/18/2021: Sonographer Comments: Suboptimal apical window and patient is morbidly obese. Image acquisition challenging due to patient body habitus and Image acquisition challenging due to respiratory motion. IV attempt was made for Definity, unsuccessful.  IMPRESSIONS    1. Left ventricular ejection fraction, by estimation, is 60 to 65%. The  left ventricle has normal function. The left ventricle has no regional  wall motion abnormalities. Left ventricular diastolic parameters were  normal.   2. Right ventricular systolic function is normal. The right ventricular  size is normal.   3. Left atrial size was mild to moderately dilated.   4. The mitral valve is grossly normal. Mild  mitral valve regurgitation.   5. The aortic valve is tricuspid. There is mild calcification of the  aortic valve. There is mild thickening of the aortic valve. Aortic valve  regurgitation is mild. Mild aortic valve stenosis.   Comparison(s): EF 60%, mild AI.   CT Chest High Resolution  12/05/2020: FINDINGS: Cardiovascular: Heart size is normal. There is no significant pericardial fluid, thickening or pericardial calcification. There is aortic atherosclerosis, as well as atherosclerosis of the great vessels of the mediastinum and the coronary arteries, including calcified atherosclerotic plaque in the left anterior descending and right coronary arteries. Calcifications of the aortic valve.   Mediastinum/Nodes: Multiple prominent but nonenlarged anterior mediastinal lymph nodes, similar to numerous prior examinations dating back to 2013, nonspecific, but presumably benign. No pathologically enlarged mediastinal or hilar lymph nodes. Please note that accurate exclusion of hilar adenopathy is limited on noncontrast CT scans. Esophagus is unremarkable in appearance. No axillary lymphadenopathy.   Lungs/Pleura: High-resolution images demonstrates some very mild ground-glass attenuation and septal thickening most evident in the lower lobes of the lungs bilaterally. Mild cylindrical bronchiectasis also noted, also most evident in the lower lobes of the lungs bilaterally. No subpleural reticulation, and no honeycombing. Inspiratory and expiratory imaging is unremarkable. Postoperative changes of open lung biopsy in the periphery of the left lower lobe. No acute consolidative airspace disease. No pleural effusions. No suspicious appearing pulmonary nodules or masses are noted.   Upper Abdomen: Small low-attenuation lesions in the liver, incompletely characterized on today's non-contrast CT examination but measuring up to 1.3 cm in segment 2 of the liver, likely to represent small cysts.    Musculoskeletal: There are no aggressive appearing lytic or blastic lesions noted in the visualized portions of the skeleton.   IMPRESSION: 1. As with prior studies, the predominant feature on today's examination is mild cylindrical bronchiectasis. Today's study does demonstrate some very mild ground-glass attenuation in the lower lobes of the lungs. This is nonspecific, but the possibility of developing interstitial lung disease is not entirely excluded. Repeat high-resolution chest CT is recommended in 12 months to assess for further temporal changes in the appearance of the lung parenchyma if clinically appropriate. 2. Aortic atherosclerosis, in addition to 2 vessel coronary artery disease. Please assessment for potential risk factor modification, dietary therapy or  pharmacologic therapy may be warranted, if clinically indicated. 3. There are calcifications of the aortic valve. Echocardiographic correlation for evaluation of potential valvular dysfunction may be warranted if clinically indicated.   Aortic Atherosclerosis (ICD10-I70.0).   Cardiopulmonary Exercise Test 06/20/19 Pre-Exercise PFTs   FVC 2.77 (67%)       FEV1 2.34 (75%)         FEV1/FVC 85 (111%)         MVV 100 (70%)   Post-Exercise PFTs  (from lowest post-exercise trial (%change from rest)) FVC performed IPE, 5, 10, 15 mins   FVC 2.72 (-1%) 15 mins       FEV1 2.22 (-5%) 15 mins     FEV1/FVC 80 (-6%) 5 mins       Notes: Patient gave a very good effort. Pulse-oximetry remained 97-98% for the duration of exercise. Exercise was performed on a cycle ergometer starting at [facility] and increasing by 12W/min.   Conclusion: Exercise testing with gas exchange demonstrates moderate functional impairment when compared to matched sedentary norms. Patient's primary limitation appears secondary to body habitus and deconditioning. She has restriction on spirometry but had adequate breathing reserve at peak exercise. There was  also chronotropic incompetence at peak exercise, which is possibly contributing to exercise intolerance.  Echo 01/26/18: Study Conclusions - Left ventricle: The cavity size was normal. Wall thickness was   normal. Systolic function was normal. The estimated ejection   fraction was in the range of 60% to 65%. Wall motion was normal;   there were no regional wall motion abnormalities. Features are   consistent with a pseudonormal left ventricular filling pattern,   with concomitant abnormal relaxation and increased filling   pressure (grade 2 diastolic dysfunction). - Aortic valve: Mildly calcified annulus. There was mild   regurgitation.   Recent Labs: 03/04/2021: Magnesium 2.0 05/28/2021: Hemoglobin 13.8; Platelets 143.0; TSH 2.42 09/16/2021: ALT 14; BUN 11; Creatinine, Ser 0.67; Potassium 4.3; Sodium 137    Lipid Panel    Component Value Date/Time   CHOL 139 09/16/2021 0956   TRIG 48 09/16/2021 0956   HDL 61 09/16/2021 0956   CHOLHDL 2.3 09/16/2021 0956   CHOLHDL 3 05/28/2021 1100   VLDL 13.8 05/28/2021 1100   LDLCALC 67 09/16/2021 0956      Wt Readings from Last 3 Encounters:  12/17/21 256 lb 3.2 oz (116.2 kg)  09/02/21 254 lb 11.2 oz (115.5 kg)  05/28/21 245 lb 12.8 oz (111.5 kg)      ASSESSMENT AND PLAN:  Palpitations In the last month Mr. Raenette Rover has been experiencing palpitations.  This is new for him.  It is happened several times and only occurs when he is sleeping.  He has been using his CPAP faithfully.  He denies GERD or anxiety.  We will check a CBC, magnesium, TSH, and CMP.  We will also have him wear a 30-day monitor to better assess the underlying rhythm.  Hyperlipidemia Lipids are well controlled and he is tolerating atorvastatin.  History of obstructive sleep apnea He tolerates his CPAP well.    Current medicines are reviewed at length with the patient today.  The patient does not have concerns regarding medicines.  The following changes have been  made:  no change  Labs/ tests ordered today include:   Orders Placed This Encounter  Procedures   Comp Met (CMET)   CBC   TSH   Magnesium   CARDIAC EVENT MONITOR   EKG 12-Lead     Disposition:   FU  with Holleigh Crihfield C. Oval Linsey, MD, [facility] in 6-8 weeks.    I,Jessica Ford,acting as a Education administrator for [place], MD.,have documented all relevant documentation on the behalf of Chad Latch, MD,as directed by  Chad Latch, MD while in the presence of Chad Latch, MD.  I, [place] Oval Linsey, MD have reviewed all documentation for this visit.  The documentation of the exam, diagnosis, procedures, and orders on 12/17/2021 are all accurate and complete.  Signed, Rhythm Gubbels C. Oval Linsey, MD, [facility]  12/17/2021 5:31 PM    [place]

## 2021-12-17 NOTE — Progress Notes (Signed)
error 

## 2021-12-24 DIAGNOSIS — J329 Chronic sinusitis, unspecified: Secondary | ICD-10-CM | POA: Diagnosis not present

## 2021-12-27 ENCOUNTER — Encounter (HOSPITAL_BASED_OUTPATIENT_CLINIC_OR_DEPARTMENT_OTHER): Payer: Self-pay | Admitting: Cardiovascular Disease

## 2021-12-30 ENCOUNTER — Other Ambulatory Visit: Payer: Self-pay | Admitting: *Deleted

## 2021-12-30 DIAGNOSIS — R002 Palpitations: Secondary | ICD-10-CM

## 2021-12-30 NOTE — Telephone Encounter (Signed)
Chad [name], just wanted to follow up on this event monitor that Dr. Oval Linsey ordered, patient has let us know that he hasn't received it. Order was placed 9/19.

## 2022-01-03 ENCOUNTER — Ambulatory Visit: Payer: Medicare Other | Attending: Cardiovascular Disease

## 2022-01-03 DIAGNOSIS — R002 Palpitations: Secondary | ICD-10-CM

## 2022-01-16 DIAGNOSIS — R252 Cramp and spasm: Secondary | ICD-10-CM | POA: Diagnosis not present

## 2022-01-16 DIAGNOSIS — R002 Palpitations: Secondary | ICD-10-CM | POA: Diagnosis not present

## 2022-01-16 DIAGNOSIS — Z79899 Other long term (current) drug therapy: Secondary | ICD-10-CM | POA: Diagnosis not present

## 2022-01-16 LAB — COMPREHENSIVE METABOLIC PANEL
ALT: 16 IU/L (ref 0–44)
AST: 21 IU/L (ref 0–40)
Albumin/Globulin Ratio: 1.3 (ref 1.2–2.2)
Albumin: 4 g/dL (ref 3.8–4.8)
Alkaline Phosphatase: 69 IU/L (ref 44–121)
BUN/Creatinine Ratio: 14 (ref 10–24)
BUN: 11 mg/dL (ref 8–27)
Bilirubin Total: 0.4 mg/dL (ref 0.0–1.2)
CO2: 25 mmol/L (ref 20–29)
Calcium: 9 mg/dL (ref 8.6–10.2)
Chloride: 102 mmol/L (ref 96–106)
Creatinine, Ser: 0.77 mg/dL (ref 0.76–1.27)
Globulin, Total: 3.1 g/dL (ref 1.5–4.5)
Glucose: 91 mg/dL (ref 70–99)
Potassium: 4.5 mmol/L (ref 3.5–5.2)
Sodium: 139 mmol/L (ref 134–144)
Total Protein: 7.1 g/dL (ref 6.0–8.5)
eGFR: 95 mL/min/{1.73_m2} (ref >59)

## 2022-01-16 LAB — CBC
Hematocrit: 39.3 % (ref 37.5–51.0)
Hemoglobin: 13.2 g/dL (ref 13.0–17.7)
MCH: 29.6 pg (ref 26.6–33.0)
MCHC: 33.6 g/dL (ref 31.5–35.7)
MCV: 88 fL (ref 79–97)
Platelets: 159 10*3/uL (ref 150–450)
RBC: 4.46 x10E6/uL (ref 4.14–5.80)
RDW: 13.8 % (ref 11.6–15.4)
WBC: 5.6 10*3/uL (ref 3.4–10.8)

## 2022-01-16 LAB — MAGNESIUM: Magnesium: 2 mg/dL (ref 1.6–2.3)

## 2022-01-16 LAB — TSH: TSH: 3.97 u[IU]/mL (ref 0.450–4.500)

## 2022-02-04 ENCOUNTER — Ambulatory Visit (INDEPENDENT_AMBULATORY_CARE_PROVIDER_SITE_OTHER): Payer: Medicare Other | Admitting: Family

## 2022-02-04 ENCOUNTER — Encounter (HOSPITAL_BASED_OUTPATIENT_CLINIC_OR_DEPARTMENT_OTHER): Payer: Self-pay | Admitting: Family

## 2022-02-04 VITALS — BP 107/68 | HR 81 | Ht 74.0 in | Wt 259.0 lb

## 2022-02-04 DIAGNOSIS — I350 Nonrheumatic aortic (valve) stenosis: Secondary | ICD-10-CM | POA: Diagnosis not present

## 2022-02-04 DIAGNOSIS — E782 Mixed hyperlipidemia: Secondary | ICD-10-CM

## 2022-02-04 DIAGNOSIS — R002 Palpitations: Secondary | ICD-10-CM

## 2022-02-04 NOTE — Progress Notes (Signed)
Office Visit    Patient Name: Chad [name] Date of Encounter: 02/04/2022  PCP:  Isaac Bliss, [place], [place]  Cardiologist:  Skeet Latch, MD  Advanced Practice Provider:  No care team member to display Electrophysiologist:  None      Chief Complaint    Chad [name] is a 71 y.o. male presents today for follow up after monitor.   Past Medical History    Past Medical History:  Diagnosis Date   Anemia    'YEARS AGO"   Aortic stenosis 09/02/2021   Aortic valve calcification 03/04/2021   Arthritis    ASTHMA 02/11/2007        CARBUNCLE AND FURUNCLE OF LEG EXCEPT FOOT 08/14/2009   Qualifier: Diagnosis of  By: Wynona Luna    Carotid stenosis, asymptomatic 01/19/2018   1-39% on carotid Doppler 2015.    CARPAL TUNNEL SYNDROME, LEFT 02/15/2009   Qualifier: Diagnosis of  By: Wynona Luna    Chronic interstitial lung disease ([place])    PULMONARY INFILTRATE  --  PULMOLOGIST-  DR CLANCE   Chronic steroid use    INTERSTITIAL LUNG DISEASE   COLONIC POLYPS, HX OF 12/23/2006   Qualifier: Diagnosis of  By: Marca Ancona RMA, Lucy     Complication of anesthesia    EMERGENCE COMBATIVENESS---  PLEASE REFER TO VATS 01-05-1012 PROCEDURE ,  DR Linna Caprice DOCUMENTED GRADE IV DIFFICULT VISUAL AIRWAY   DIABETES MELLITUS, TYPE II, BORDERLINE 11/14/2008   Qualifier: Diagnosis of  By: Wynona Luna    Dyspnea on exertion    Elevated PSA 08/13/2011   GERD 12/23/2006   Qualifier: Diagnosis of  By: Marca Ancona RMA, Lucy     History of anal fissures    History of hypothyroidism    Hyperlipidemia 05/01/2016   Hypothyroidism 12/23/2006   Qualifier: Diagnosis of  By: Larose Kells     MEDIASTINAL LYMPHADENOPATHY 08/30/2007   Qualifier: Diagnosis of  By: Joya Gaskins MD, Burnett Harry    Mild ascending aorta dilatation ([place]) 01/19/2018   Oral thrush 04/22/2012   OSA (obstructive sleep apnea) 12/23/2006   Pt prefers to stay on auto setting.     Palpitations 12/17/2021    Pneumonia 11/13/2011   Prostate cancer ([place]) 06/09/12 bx   Adenocarcinoma   PULMONARY EDEMA 07/05/2007   Qualifier: Diagnosis of  By: Joya Gaskins MD, Burnett Harry    Pulmonary infiltrate 12/23/2011   H/o LN, pericardial effusion, pleural effusion unknown origin 2009.  ?resolved with prednisone?? Mediastinoscopy 2009:  Benign lymphoid hyperplasia Ct chest 11/2011: scattered GGO, interstitial changes, small pulmonary nodules, mild LN No response to prolonged course of abx. Autoimmune w/u:  ESR 78, but ACE/RF/DSDNA/CCP/ENA/SCL70/HP panel/ANCA all neg.  ANA low positive (prob neg). VATS bx 12/2011:  CIP with organizing pna.  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  V/Q 01/2012:  Ok +++ response to steroids (started 12/2011) PFT's 12/2012:  No obstruction, TLC 3.25 (42%), DLCO 44% Pulmonary rehab 2014 xrays and symptoms greatly improved with steroids, and no recurrence off steroids    RHINOSINUSITIS, ACUTE 01/29/2010   Qualifier: Diagnosis of  By: Fanny Skates PAIN, LEFT 11/14/2008   Qualifier: Diagnosis of  By: Wynona Luna    Sinusitis 11/26/2010   Sleep apnea    on cpap   Thrombocytopenia [facility])    Past Surgical History:  Procedure Laterality Date   CARDIOVASCULAR STRESS TEST  01-10-2008   NORMAL EXERCISE  STRESS TEST AT GOOD WORKLOAD   COLONOSCOPY  01-15-2009   polyps and tics   LUNG BIOPSY     MEDIASTINOSCOPY  07-12-2007   BILATERAL PLEURAL EFFUSIONS   NASAL SINUS SURGERY     POLYPECTOMY     prostate biopsy  06/11/12   Adenocarcinoma   RADIOACTIVE SEED IMPLANT N/A 12/23/2012   Procedure: RADIOACTIVE SEED IMPLANT;  Surgeon: Franchot Gallo, MD;  Location: [facility];  Service: Urology;  Laterality: N/A;   81   seeds implanted    TRANSTHORACIC ECHOCARDIOGRAM  12-31-2011   NORMAL LVF/   EF  55-60%   VIDEO ASSISTED THORACOSCOPY (VATS)/ LYMPH NODE SAMPLING Left 01-05-2012   LUNG AND LYMPH NODE BX'S (CHRONIC INTERSTITIAL PNEUMONIA)    Allergies  Allergies  Allergen  Reactions   Ivp Dye [Iodinated Contrast Media] Anaphylaxis    Coma for a day in '09   Doxycycline Nausea Only   Lactose Intolerance (Gi) Other (See Comments)    History of Present Illness    Chad [name] is a 71 y.o. male with a hx of chronic diastolic heart failure asymptomatic coronary calcification, aortic stenosis, carotid stenosis, ILD/pulmonary fibrosis, OSA on CPAP, hyperlipidemia, diabetes, palpitations last seen 12/17/21.  Prior echo 12/2011 LVEF 55 to 60%, mildly dilated ascending aorta.  Carotid Doppler 11/2013 bilateral 1 to 39% stenosis.  Initially saw Dr. Curt Bears August 2017 after referral for CT scan with coronary calcification and calcified aortic valve.  He was asymptomatic.  FLP with LDL 126.  CPX 02/2016 mild functional impairment in gas exchange mild restrictive physiology.  Limitations at peak exercise felt attributed to body habitus.  Repeat echo 12/2017 LVEF 60 to 17%, grade 2 diastolic dysfunction plan no aortic aneurysm.  Repeat CPX with Dr. Chase Caller 05/2019 with moderate functional impairment mostly due to high body habitus and deconditioning.  Some chronotropic incompetence and restriction on spirometry.  CT chest 11/2020 mild cylindrical bronchiectasis, calcification in the aorta, two-vessel coronary disease, calcification of the aortic valve.  He was working lifestyle changes.  Atorvastatin reduced with plan to start PCSK9 if lipids above goal.  Repeat echo 02/2021 mild aortic stenosis with mean gradient 12 mmHg, descending aorta not dilated.  Last seen 12/18/2021 noting palpitations waking him up at night.  Subsequent 30-day monitor preliminary report with predominantly normal sinus rhythm with rare PAC/PVC less than 1% burden.  He presents today for follow-up independently. Notes daily palpitations lasting a couple seconds to minutes.  Overall not bothersome.  Reports occasional dizziness with sensation of spinning which she attributes to the stenosis and vertigo.  No  near-syncope, syncope.  No exertional dyspnea.  No formal exercise routine.  Recently enjoyed a trip to [place] celebrating his granddaughter turning 76 years old.   EKGs/Labs/Other Studies Reviewed:   The following studies were reviewed today:  EKG:  EKG is not ordered today.   Recent Labs: 01/16/2022: ALT 16; BUN 11; Creatinine, Ser 0.77; Hemoglobin 13.2; Magnesium 2.0; Platelets 159; Potassium 4.5; Sodium 139; TSH 3.970  Recent Lipid Panel    Component Value Date/Time   CHOL 139 09/16/2021 0956   TRIG 48 09/16/2021 0956   HDL 61 09/16/2021 0956   CHOLHDL 2.3 09/16/2021 0956   CHOLHDL 3 05/28/2021 1100   VLDL 13.8 05/28/2021 1100   LDLCALC 67 09/16/2021 0956   Home Medications   Current Meds  Medication Sig   albuterol (VENTOLIN HFA) 108 (90 Base) MCG/ACT inhaler INHALE 2 PUFFS BY MOUTH FOUR TIMES A DAY FOR LUNG IRRITATION, BREATHING,  LUNG SCARRING, MILD ASTHMA.  TAKE 2 PUFFS  WHILE STANDING UP WHEN NEEDED UP TO 4 TIMES A DAY. FOR LUNG IRRITATION, BREATHING, LUNG SCARRING, MILD ASTHMA.  TAKE 2 PUFFS   WHILE STANDING UP WHEN NEEDED UP TO 4 TIMES A DAY.   atorvastatin (LIPITOR) 10 MG tablet TAKE 1 TABLET BY MOUTH EVERY DAY   Azelastine HCl (ASTEPRO) 0.15 % SOLN Place 1 spray into the nose 2 (two) times daily.   Cholecalciferol 50 MCG (2000 UT) TABS TAKE ONE TABLET BY MOUTH DAILY FOR LOW VITAMIN D   doxepin (SINEQUAN) 25 MG capsule Take 1 capsule by mouth as needed.   fluticasone (FLONASE) 50 MCG/ACT nasal spray INSTILL 2 SPRAYS IN EACH NOSTRIL DAILY FOR ALLERGIC RHINITIS, ALLERGIES, NASAL CONGESTION.   Ibuprofen 200 MG CAPS Take 600 mg by mouth as needed.     Review of Systems      All other systems reviewed and are otherwise negative except as noted above.  Physical Exam    VS:  BP 107/68   Pulse 81   Ht 6' 2" (1.88 m)   Wt 259 lb (117.5 kg)   BMI 33.25 kg/m  , BMI Body mass index is 33.25 kg/m.  Wt Readings from Last 3 Encounters:  02/04/22 259 lb (117.5 kg)   12/17/21 256 lb 3.2 oz (116.2 kg)  09/02/21 254 lb 11.2 oz (115.5 kg)     GEN: Well nourished, overweight, well developed, in no acute distress. HEENT: normal. Neck: Supple, no JVD, carotid bruits, or masses. Cardiac: RRR, no murmurs, rubs, or gallops. No clubbing, cyanosis, edema.  Radials/PT 2+ and equal bilaterally.  Respiratory:  Respirations regular and unlabored, clear to auscultation bilaterally. GI: Soft, nontender, nondistended. MS: No deformity or atrophy. Skin: Warm and dry, no rash. Neuro:  Strength and sensation are intact. Psych: Normal affect.  Assessment & Plan    Palpitations - Daily but lasting seconds and overall not bothersome. Preliminary monitor report with NSR and rare PVC/PAC <1% burden. Discussed possible PRN Metoprolol which he politely declines as symptoms not bothersome. Educated to avoid caffeine, manage stress well, stay well hydrate.   HLD - 08/2021 LDL 67. Continue Atorvastatin.   Obesity - Weight loss via diet and exercise encouraged. Discussed the impact being overweight would have on cardiovascular risk. Encouraged to enroll in Right Start program at [facility].   OSA - CPAP compliance encouraged.          Disposition: Follow up in 6 month(s) with Skeet Latch, MD or APP.  Signed, Loel Dubonnet, NP 02/04/2022, 1:26 PM [facility]

## 2022-02-04 NOTE — Patient Instructions (Addendum)
Medication Instructions:   Your physician has recommended you make the following change in your medication:    May trial over the counter Meclizine as needed for dizziness.   *If you need a refill on your cardiac medications before your next appointment, please call your pharmacy*   Lab Work/Testing/Procedures:  Your monitor showed normal sinus rhythm with an early heart beat occurring less than 1% of the time.   Your physician has requested that you have an echocardiogram after 03/18/22. Echocardiography is a painless test that uses sound waves to create images of your heart. It provides your doctor with information about the size and shape of your heart and how well your heart's chambers and valves are working. This procedure takes approximately one hour. There are no restrictions for this procedure. Please do NOT wear cologne, perfume, aftershave, or lotions (deodorant is allowed). Please arrive 15 minutes prior to your appointment time.   Follow-Up: At [facility], you and your health needs are our priority.  As part of our continuing mission to provide you with exceptional heart care, we have created designated Provider Care Teams.  These Care Teams include your primary Cardiologist (physician) and Advanced Practice Providers (APPs -  Physician Assistants and Nurse Practitioners) who all work together to provide you with the care you need, when you need it.  We recommend signing up for the patient portal called "MyChart".  Sign up information is provided on this After Visit Summary.  MyChart is used to connect with patients for Virtual Visits (Telemedicine).  Patients are able to view lab/test results, encounter notes, upcoming appointments, etc.  Non-urgent messages can be sent to your provider as well.   To learn more about what you can do with MyChart, go to [email or web address].    Your next appointment:   6 month(s)  The format for your next appointment:   In  Person  Provider:   Skeet Latch, MD    Other Instructions  Heart Healthy Diet Recommendations: A low-salt diet is recommended. Meats should be grilled, baked, or boiled. Avoid fried foods. Focus on lean protein sources like fish or chicken with vegetables and fruits. The American Heart Association is a Microbiologist!  American Heart Association Diet and Lifeystyle Recommendations   Exercise recommendations: The American Heart Association recommends 150 minutes of moderate intensity exercise weekly. Try 30 minutes of moderate intensity exercise 4-5 times per week. This could include walking, jogging, or swimming.  To prevent palpitations: Make sure you are adequately hydrated.  Avoid and/or limit caffeine containing beverages like soda or tea. Exercise regularly.  Manage stress well. Some over the counter medications can cause palpitations such as Benadryl, AdvilPM, TylenolPM. Regular Advil or Tylenol do not cause palpitations.    Important Information About Sugar

## 2022-02-10 DIAGNOSIS — U071 COVID-19: Secondary | ICD-10-CM | POA: Diagnosis not present

## 2022-02-16 DIAGNOSIS — Z23 Encounter for immunization: Secondary | ICD-10-CM | POA: Diagnosis not present

## 2022-03-04 DIAGNOSIS — J329 Chronic sinusitis, unspecified: Secondary | ICD-10-CM | POA: Diagnosis not present

## 2022-03-04 DIAGNOSIS — Z9889 Other specified postprocedural states: Secondary | ICD-10-CM | POA: Diagnosis not present

## 2022-03-17 DIAGNOSIS — Z8546 Personal history of malignant neoplasm of prostate: Secondary | ICD-10-CM | POA: Diagnosis not present

## 2022-03-19 ENCOUNTER — Other Ambulatory Visit (HOSPITAL_BASED_OUTPATIENT_CLINIC_OR_DEPARTMENT_OTHER): Payer: Medicare Other

## 2022-03-23 ENCOUNTER — Other Ambulatory Visit (HOSPITAL_BASED_OUTPATIENT_CLINIC_OR_DEPARTMENT_OTHER): Payer: Self-pay | Admitting: Cardiovascular Disease

## 2022-03-25 NOTE — Telephone Encounter (Signed)
Rx(s) sent to pharmacy electronically.  

## 2022-03-27 ENCOUNTER — Telehealth: Payer: Self-pay | Admitting: Pulmonary Disease

## 2022-03-28 NOTE — Telephone Encounter (Signed)
Left a detailed message ( per DPR ) to tell the Pt that he did not need to do anything with [facility]. We should be able to check his compliance in 30-90 days after starting therapy. Nothing further needed.

## 2022-04-03 ENCOUNTER — Telehealth: Payer: Self-pay | Admitting: Pulmonary Disease

## 2022-04-03 DIAGNOSIS — G4733 Obstructive sleep apnea (adult) (pediatric): Secondary | ICD-10-CM

## 2022-04-04 NOTE — Telephone Encounter (Signed)
Spoke with the pt  He states that he received a new CPAP machine from the [place]- had been using [facility]  They need order for the setting of 6 cm  Order placed  Nothing further needed

## 2022-04-09 ENCOUNTER — Ambulatory Visit (INDEPENDENT_AMBULATORY_CARE_PROVIDER_SITE_OTHER): Payer: Medicare Other

## 2022-04-09 DIAGNOSIS — I350 Nonrheumatic aortic (valve) stenosis: Secondary | ICD-10-CM

## 2022-04-09 LAB — ECHOCARDIOGRAM COMPLETE
AR max vel: 1.55 cm2
AV Area VTI: 1.53 cm2
AV Area mean vel: 1.42 cm2
AV Mean grad: 13 mmHg
AV Peak grad: 23.8 mmHg
Ao pk vel: 2.44 m/s
Area-P 1/2: 4.89 cm2
MV M vel: 4.62 m/s
MV Peak grad: 85.4 mmHg
P 1/2 time: 423 msec
S' Lateral: 2.94 cm

## 2022-04-10 ENCOUNTER — Other Ambulatory Visit (HOSPITAL_BASED_OUTPATIENT_CLINIC_OR_DEPARTMENT_OTHER): Payer: Self-pay

## 2022-04-10 DIAGNOSIS — I350 Nonrheumatic aortic (valve) stenosis: Secondary | ICD-10-CM

## 2022-04-10 NOTE — Progress Notes (Signed)
Repeat Testing ordered!

## 2022-04-21 DIAGNOSIS — I350 Nonrheumatic aortic (valve) stenosis: Secondary | ICD-10-CM | POA: Diagnosis not present

## 2022-04-21 DIAGNOSIS — J302 Other seasonal allergic rhinitis: Secondary | ICD-10-CM | POA: Diagnosis not present

## 2022-04-21 DIAGNOSIS — Z0000 Encounter for general adult medical examination without abnormal findings: Secondary | ICD-10-CM | POA: Diagnosis not present

## 2022-04-21 DIAGNOSIS — Z8546 Personal history of malignant neoplasm of prostate: Secondary | ICD-10-CM | POA: Diagnosis not present

## 2022-04-21 DIAGNOSIS — Z9889 Other specified postprocedural states: Secondary | ICD-10-CM | POA: Diagnosis not present

## 2022-04-21 DIAGNOSIS — L309 Dermatitis, unspecified: Secondary | ICD-10-CM | POA: Diagnosis not present

## 2022-04-21 DIAGNOSIS — Z1159 Encounter for screening for other viral diseases: Secondary | ICD-10-CM | POA: Diagnosis not present

## 2022-04-21 DIAGNOSIS — E7800 Pure hypercholesterolemia, unspecified: Secondary | ICD-10-CM | POA: Diagnosis not present

## 2022-04-21 DIAGNOSIS — Z23 Encounter for immunization: Secondary | ICD-10-CM | POA: Diagnosis not present

## 2022-04-21 DIAGNOSIS — Z1331 Encounter for screening for depression: Secondary | ICD-10-CM | POA: Diagnosis not present

## 2022-04-21 DIAGNOSIS — I700 Atherosclerosis of aorta: Secondary | ICD-10-CM | POA: Diagnosis not present

## 2022-05-16 ENCOUNTER — Encounter (HOSPITAL_BASED_OUTPATIENT_CLINIC_OR_DEPARTMENT_OTHER): Payer: Self-pay

## 2022-06-19 DIAGNOSIS — M5451 Vertebrogenic low back pain: Secondary | ICD-10-CM | POA: Diagnosis not present

## 2022-08-19 ENCOUNTER — Encounter (HOSPITAL_BASED_OUTPATIENT_CLINIC_OR_DEPARTMENT_OTHER): Payer: Self-pay | Admitting: Cardiovascular Disease

## 2022-08-19 ENCOUNTER — Ambulatory Visit (INDEPENDENT_AMBULATORY_CARE_PROVIDER_SITE_OTHER): Payer: Medicare Other | Admitting: Cardiovascular Disease

## 2022-08-19 VITALS — BP 100/72 | HR 76 | Ht 74.0 in | Wt 257.0 lb

## 2022-08-19 DIAGNOSIS — Z5181 Encounter for therapeutic drug level monitoring: Secondary | ICD-10-CM

## 2022-08-19 DIAGNOSIS — I350 Nonrheumatic aortic (valve) stenosis: Secondary | ICD-10-CM

## 2022-08-19 DIAGNOSIS — I77810 Thoracic aortic ectasia: Secondary | ICD-10-CM

## 2022-08-19 DIAGNOSIS — R5383 Other fatigue: Secondary | ICD-10-CM

## 2022-08-19 DIAGNOSIS — E782 Mixed hyperlipidemia: Secondary | ICD-10-CM

## 2022-08-19 DIAGNOSIS — I6523 Occlusion and stenosis of bilateral carotid arteries: Secondary | ICD-10-CM

## 2022-08-19 DIAGNOSIS — G4733 Obstructive sleep apnea (adult) (pediatric): Secondary | ICD-10-CM | POA: Diagnosis not present

## 2022-08-19 HISTORY — DX: Other fatigue: R53.83

## 2022-08-19 NOTE — Patient Instructions (Signed)
Medication Instructions:  Your physician recommends that you continue on your current medications as directed. Please refer to the Current Medication list given to you today.   *If you need a refill on your cardiac medications before your next appointment, please call your pharmacy*  Lab Work: FASTING LP/CMET IN 3 MONTHS   If you have labs (blood work) drawn today and your tests are completely normal, you will receive your results only by: MyChart Message (if you have MyChart) OR A paper copy in the mail If you have any lab test that is abnormal or we need to change your treatment, we will call you to review the results.  Testing/Procedures: NONE  Follow-Up: At [facility], you and your health needs are our priority.  As part of our continuing mission to provide you with exceptional heart care, we have created designated Provider Care Teams.  These Care Teams include your primary Cardiologist (physician) and Advanced Practice Providers (APPs -  Physician Assistants and Nurse Practitioners) who all work together to provide you with the care you need, when you need it.  We recommend signing up for the patient portal called "MyChart".  Sign up information is provided on this After Visit Summary.  MyChart is used to connect with patients for Virtual Visits (Telemedicine).  Patients are able to view lab/test results, encounter notes, upcoming appointments, etc.  Non-urgent messages can be sent to your provider as well.   To learn more about what you can do with MyChart, go to [email or web address].    Your next appointment:   6 month(s)  Provider:   Chilton Si, MD    Other Instructions Exercise recommendations: The American Heart Association recommends 150 minutes of moderate intensity exercise weekly. Try 30 minutes of moderate intensity exercise 4-5 times per week. This could include walking, jogging, or swimming.

## 2022-08-19 NOTE — Progress Notes (Signed)
Cardiology Office Note  Date:  08/19/2022   ID:  Chad [name], DOB 04-13-1950, MRN [id number]  PCP:  Chad Joe, [name]  Cardiologist:   Chad Si, [name]   No chief complaint on file.    History of Present Illness: Chad [name] is a 72 y.o. male with chronic diastolic heart failure, asymptomatic coronary calcifications, mild aortic stenosis, carotid stenosis, interstitial lung disease/pulmonary fibrosis, OSA on CPAP, hyperlipidemia, and diabetes who presents for follow-up.  He initially saw Chad [name] 10/2015.  He was referred because of a CT scan that showed coronary calcifications and calcified aortic valve.  At the time he was completely asymptomatic.  Fasting lipids revealed an LDL of 126.  He had an echo 12/2011 that revealed LVEF 55 to 60%, and a mildly dilated ascending aorta.  Carotid Dopplers 11/2013 revealed 1-39% stenosis bilaterally.  He had a CPX 02/2016 that revealed mild functional impairment in gas exchange and mild restrictive physiology.  It was felt that his imitations at peak exercise were mostly attributed to body habitus.  He had a repeat echo 12/2017 that showed LVEF 60-65% with grade 2 diastolic dysfunction and no aortic aneurysm.  He complained of intermittent dizzy spells. He had a cardiopulmonary exercise test with Chad [name] on 05/2019 that revealed moderate functional impairment mostly due to body habitus and deconditioning. There was also some chronotropic incompetence and restriction on spirometry. When he last saw his pulmonologist, his dyspnea was determined to be out of proportion of his coronary disease. Chest CT on 11/2020 showed mild cyclindrical bronchiectasis, calcification in the aorta, 2 vessel coronary disease, and calcification of the aortic valve.  He was working on lifestyle changes with increased exercise and a plant based diet. He noted left leg cramping. Atorvastatin was reduced with plans to start PCSK9 inhibitor if his lipids remained above  goal.  Repeat Echo 02/2021 revealed mild AS (mean gradient 12 mm Hg); descending aorta was not dilated.   At his appointment 11/2021 he reported episodic palpitations waking him up at night.  30 Day monitor revealed rare PACs and PVCs as well as blocked PACs.  He followed up with Chad Shields, NP 01/2022 and was stable.  He was encouraged to increase his exercise.  Chad [name] reports experiencing increased fatigue, particularly noticeable in the middle of the day, which sometimes necessitates taking naps. He mentions that his sleep quality has improved, now averaging about six and a half hours per night, and he is compliant with using his CPAP machine.  When he tries to exercise he feels exhausted for a couple of days after and requires a day or two of rest before resuming. He denies exertional CP or SOB. He has no LE edema, orthopnea or PND.  He admits to sometimes feeling depressed and has noticed a decrease in his interest in activities he used to enjoy, such as hanging out with friends. His appetite remains good.  He works as a Paediatric nurse and feels very stressed before he will have to go to work.  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  His palpitations have improved.   Past Medical History:  Diagnosis Date   Anemia    'YEARS AGO"   Aortic stenosis 09/02/2021   Aortic valve calcification 03/04/2021   Arthritis    ASTHMA 02/11/2007        CARBUNCLE AND FURUNCLE OF LEG EXCEPT FOOT 08/14/2009   Qualifier: Diagnosis of  By: Chad [name]  Carotid stenosis, asymptomatic 01/19/2018   1-39% on carotid Doppler 2015.    CARPAL TUNNEL SYNDROME, LEFT 02/15/2009   Qualifier: Diagnosis of  By: Chad [name]    Chronic interstitial lung disease (HCC)    PULMONARY INFILTRATE  --  PULMOLOGIST-  Chad [name]   Chronic steroid use    INTERSTITIAL LUNG DISEASE   COLONIC POLYPS, HX OF 12/23/2006   Qualifier: Diagnosis of  By: Chad [name]     Complication of anesthesia     EMERGENCE COMBATIVENESS---  PLEASE REFER TO VATS 01-05-1012 PROCEDURE ,  Chad Chad [name] DOCUMENTED GRADE IV DIFFICULT VISUAL AIRWAY   DIABETES MELLITUS, TYPE II, BORDERLINE 11/14/2008   Qualifier: Diagnosis of  By: Chad [name]    Dyspnea on exertion    Elevated PSA 08/13/2011   Fatigue 08/19/2022   GERD 12/23/2006   Qualifier: Diagnosis of  By: Chad [name]     History of anal fissures    History of hypothyroidism    Hyperlipidemia 05/01/2016   Hypothyroidism 12/23/2006   Qualifier: Diagnosis of  By: Chad [name]     MEDIASTINAL LYMPHADENOPATHY 08/30/2007   Qualifier: Diagnosis of  By: Chad [name]    Mild ascending aorta dilatation (HCC) 01/19/2018   Oral thrush 04/22/2012   OSA (obstructive sleep apnea) 12/23/2006   Pt prefers to stay on auto setting.     Palpitations 12/17/2021   Pneumonia 11/13/2011   Prostate cancer (HCC) 06/09/12 bx   Adenocarcinoma   PULMONARY EDEMA 07/05/2007   Qualifier: Diagnosis of  By: Chad [name]    Pulmonary infiltrate 12/23/2011   H/o LN, pericardial effusion, pleural effusion unknown origin 2009.  ?resolved with prednisone?? Mediastinoscopy 2009:  Benign lymphoid hyperplasia Ct chest 11/2011: scattered GGO, interstitial changes, small pulmonary nodules, mild LN No response to prolonged course of abx. Autoimmune w/u:  ESR 78, but ACE/RF/DSDNA/CCP/ENA/SCL70/HP panel/ANCA all neg.  ANA low positive (prob neg). VATS bx 12/2011:  CIP with organizing pna.  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  V/Q 01/2012:  Ok +++ response to steroids (started 12/2011) PFT's 12/2012:  No obstruction, TLC 3.25 (42%), DLCO 44% Pulmonary rehab 2014 xrays and symptoms greatly improved with steroids, and no recurrence off steroids    RHINOSINUSITIS, ACUTE 01/29/2010   Qualifier: Diagnosis of  By: Chad [name] PAIN, LEFT 11/14/2008   Qualifier: Diagnosis of  By: Chad [name]    Sinusitis 11/26/2010   Sleep apnea    on cpap    Thrombocytopenia [facility])     Past Surgical History:  Procedure Laterality Date   CARDIOVASCULAR STRESS TEST  01-10-2008   NORMAL EXERCISE STRESS TEST AT GOOD WORKLOAD   COLONOSCOPY  01-15-2009   polyps and tics   LUNG BIOPSY     MEDIASTINOSCOPY  07-12-2007   BILATERAL PLEURAL EFFUSIONS   NASAL SINUS SURGERY     POLYPECTOMY     prostate biopsy  06/11/12   Adenocarcinoma   RADIOACTIVE SEED IMPLANT N/A 12/23/2012   Procedure: RADIOACTIVE SEED IMPLANT;  Surgeon: Marcine Matar, [name];  [facility];  Service: Urology;  Laterality: N/A;   81   seeds implanted    TRANSTHORACIC ECHOCARDIOGRAM  12-31-2011   NORMAL LVF/   EF  55-60%   VIDEO ASSISTED THORACOSCOPY (VATS)/ LYMPH NODE SAMPLING Left 01-05-2012   LUNG AND LYMPH NODE BX'S (CHRONIC INTERSTITIAL PNEUMONIA)     Current Outpatient Medications  Medication Sig Dispense  Refill   albuterol (VENTOLIN HFA) 108 (90 Base) MCG/ACT inhaler INHALE 2 PUFFS BY MOUTH FOUR TIMES A DAY FOR LUNG IRRITATION, BREATHING, LUNG SCARRING, MILD ASTHMA.  TAKE 2 PUFFS  WHILE STANDING UP WHEN NEEDED UP TO 4 TIMES A DAY. FOR LUNG IRRITATION, BREATHING, LUNG SCARRING, MILD ASTHMA.  TAKE 2 PUFFS   WHILE STANDING UP WHEN NEEDED UP TO 4 TIMES A DAY.     atorvastatin (LIPITOR) 10 MG tablet TAKE 1 TABLET BY MOUTH EVERY DAY 90 tablet 2   Azelastine HCl (ASTEPRO) 0.15 % SOLN Place 1 spray into the nose 2 (two) times daily. 30 mL 2   Cholecalciferol 50 MCG (2000 UT) TABS TAKE ONE TABLET BY MOUTH DAILY FOR LOW VITAMIN D     doxepin (SINEQUAN) 25 MG capsule Take 1 capsule by mouth as needed.     fluticasone (FLONASE) 50 MCG/ACT nasal spray INSTILL 2 SPRAYS IN EACH NOSTRIL DAILY FOR ALLERGIC RHINITIS, ALLERGIES, NASAL CONGESTION.     Ibuprofen 200 MG CAPS Take 600 mg by mouth as needed.     No current facility-administered medications for this visit.    Allergies:   Ivp dye [iodinated contrast media], Doxycycline, and Lactose intolerance (gi)     Social History:  The patient  reports that he quit smoking about 36 years ago. His smoking use included cigarettes and cigars. He has a 0.20 pack-year smoking history. He has never used smokeless tobacco. He reports current alcohol use of about 2.0 standard drinks of alcohol per week. He reports that he does not use drugs.   Family History:  The patient's family history includes Alzheimer's disease in his maternal grandmother and mother; Bradycardia in his father; Cancer in his paternal uncle; Colon cancer in his father; Diabetes in his daughter; Heart attack in his father; Hyperlipidemia in his sister; Hypertension in his mother; Nephrolithiasis in his father; Prostate cancer in his father; Sinusitis in his brother.    ROS:   Please see the history of present illness.   (+) Palpitations All other systems are reviewed and negative.    Physical Exam: VS:  BP 100/72   Pulse 76   Ht 6\' 2"  (1.88 m)   Wt 257 lb (116.6 kg)   BMI 33.00 kg/m  , BMI Body mass index is 33 kg/m. GENERAL:  Well appearing HEENT: Pupils equal round and reactive, fundi not visualized, oral mucosa unremarkable NECK:  No jugular venous distention, waveform within normal limits, carotid upstroke brisk and symmetric, no bruits LUNGS:  Clear to auscultation bilaterally HEART:  RRR.  PMI not displaced or sustained,S1 and S2 within normal limits, no S3, no S4, no clicks, no rubs, 2/6 systolic murmur ABD:  Flat, positive bowel sounds normal in frequency in pitch, no bruits, no rebound, no guarding, no midline pulsatile mass, no hepatomegaly, no splenomegaly EXT:  2 plus pulses throughout, no edema, no cyanosis no clubbing SKIN:  No rashes no nodules NEURO:  Cranial nerves II through XII grossly intact, motor grossly intact throughout PSYCH:  Cognitively intact, oriented to person place and time   EKG:   EKG is personally reviewed. 12/17/2021:  Sinus rhythm. Rate 75 bpm. Incomplete RBBB. 09/02/2021:  EKG was not  ordered. 03/04/21: Sinus rhythm, rate 72 bpm; LAD 01/19/2020: Sinus rhythm.  Rate 81 bpm.  Incomplete right bundle branch block.  LVH. 01/11/2019: Sinus rhythm.  Rate 74 bpm.  Left axis deviation. 01/19/18: sinus rhythm.  Rate 78 bpm.  RSR' in V1.  LVH.   CT  Chest  12/10/2021:  IMPRESSION: 1. Mild cylindrical bronchiectasis. No evidence of fibrotic interstitial lung disease. 2. Mild air trapping, indicative of small airways disease. 3. Aortic atherosclerosis (ICD10-I70.0). Coronary artery calcification.  Echocardiogram  03/18/2021: Sonographer Comments: Suboptimal apical window and patient is morbidly obese. Image acquisition challenging due to patient body habitus and Image acquisition challenging due to respiratory motion. IV attempt was made for Definity, unsuccessful.  IMPRESSIONS    1. Left ventricular ejection fraction, by estimation, is 60 to 65%. The  left ventricle has normal function. The left ventricle has no regional  wall motion abnormalities. Left ventricular diastolic parameters were  normal.   2. Right ventricular systolic function is normal. The right ventricular  size is normal.   3. Left atrial size was mild to moderately dilated.   4. The mitral valve is grossly normal. Mild mitral valve regurgitation.   5. The aortic valve is tricuspid. There is mild calcification of the  aortic valve. There is mild thickening of the aortic valve. Aortic valve  regurgitation is mild. Mild aortic valve stenosis.   Comparison(s): EF 60%, mild AI.   CT Chest High Resolution  12/05/2020:   IMPRESSION: 1. As with prior studies, the predominant feature on today's examination is mild cylindrical bronchiectasis. Today's study does demonstrate some very mild ground-glass attenuation in the lower lobes of the lungs. This is nonspecific, but the possibility of developing interstitial lung disease is not entirely excluded. Repeat high-resolution chest CT is recommended in 12 months  to assess for further temporal changes in the appearance of the lung parenchyma if clinically appropriate. 2. Aortic atherosclerosis, in addition to 2 vessel coronary artery disease. Please assessment for potential risk factor modification, dietary therapy or pharmacologic therapy may be warranted, if clinically indicated. 3. There are calcifications of the aortic valve. Echocardiographic correlation for evaluation of potential valvular dysfunction may be warranted if clinically indicated.   Aortic Atherosclerosis (ICD10-I70.0).   Cardiopulmonary Exercise Test 06/20/19 Pre-Exercise PFTs   FVC 2.77 (67%)       FEV1 2.34 (75%)         FEV1/FVC 85 (111%)         MVV 100 (70%)   Post-Exercise PFTs  (from lowest post-exercise trial (%change from rest)) FVC performed IPE, 5, 10, 15 mins   FVC 2.72 (-1%) 15 mins       FEV1 2.22 (-5%) 15 mins     FEV1/FVC 80 (-6%) 5 mins       Notes: Patient gave a very good effort. Pulse-oximetry remained 97-98% for the duration of exercise. Exercise was performed on a cycle ergometer starting at [facility] and increasing by 12W/min.   Conclusion: Exercise testing with gas exchange demonstrates moderate functional impairment when compared to matched sedentary norms. Patient's primary limitation appears secondary to body habitus and deconditioning. She has restriction on spirometry but had adequate breathing reserve at peak exercise. There was also chronotropic incompetence at peak exercise, which is possibly contributing to exercise intolerance.  Echo 01/26/18: Study Conclusions - Left ventricle: The cavity size was normal. Wall thickness was   normal. Systolic function was normal. The estimated ejection   fraction was in the range of 60% to 65%. Wall motion was normal;   there were no regional wall motion abnormalities. Features are   consistent with a pseudonormal left ventricular filling pattern,   with concomitant abnormal relaxation and increased  filling   pressure (grade 2 diastolic dysfunction). - Aortic valve: Mildly calcified annulus. There was mild  regurgitation.   Recent Labs: 01/16/2022: ALT 16; BUN 11; Creatinine, Ser 0.77; Hemoglobin 13.2; Magnesium 2.0; Platelets 159; Potassium 4.5; Sodium 139; TSH 3.970    Lipid Panel    Component Value Date/Time   CHOL 139 09/16/2021 0956   TRIG 48 09/16/2021 0956   HDL 61 09/16/2021 0956   CHOLHDL 2.3 09/16/2021 0956   CHOLHDL 3 05/28/2021 1100   VLDL 13.8 05/28/2021 1100   LDLCALC 67 09/16/2021 0956      Wt Readings from Last 3 Encounters:  08/19/22 257 lb (116.6 kg)  02/04/22 259 lb (117.5 kg)  12/17/21 256 lb 3.2 oz (116.2 kg)     08/06/22: WBC 5.96, HGB 13.1, HCT 38.7,k Plt 169   ASSESSMENT AND PLAN:  # Aortic stenosis:  Mild on echo 03/2022.  Repeat echo in one year.   # Fatigue and sluggishness: - Continue using CPAP for sleep apnea. - Symptoms do not occur with exertion, making it less likely to be cardiac. - Schedule a follow-up appointment with pulmonologist to evaluate lung function. - Encourage regular exercise to improve conditioning and energy levels.  Prior CPX demonstrated deconditioning and body habitus - Monitor for signs of depression and discuss if symptoms persist or worsen.  # Non-obstructive CAD: # Hyperlipidemia: - Continue atorvastatin at the current dose.  Lipid control is borderline but could be improved with lifestyle. - Encourage dietary modifications and exercise to improve cholesterol levels. - Recheck fasting lipid panel in August.  # Possible depression: - Monitor mood and social activities.  This may be contributing to his fatigue.  - Encourage discussion with PCP for further evaluation and potential treatment options.  # Palpitations: - PACs, PVCs on monitor - Consider discussing stress and anxiety management. - Monitor for any changes in frequency or severity of palpitations.  Participation in African American heart  study: - Provide patient with information and will receive contact from research nurse for potential enrollment in the Lp(a) observational study.  Current medicines are reviewed at length with the patient today.  The patient does not have concerns regarding medicines.  The following changes have been made:  no change  Labs/ tests ordered today include:   Orders Placed This Encounter  Procedures   Lipid panel   Comprehensive metabolic panel     Disposition:   FU with Blanche Gallien C. Duke Salvia, [name], [facility] in 6  months  Signed, Krew Hortman C. Duke Salvia, [name], [facility]  08/19/2022 10:33 AM    [facility]

## 2022-09-04 DIAGNOSIS — Z8546 Personal history of malignant neoplasm of prostate: Secondary | ICD-10-CM | POA: Diagnosis not present

## 2022-09-04 DIAGNOSIS — N5089 Other specified disorders of the male genital organs: Secondary | ICD-10-CM | POA: Diagnosis not present

## 2022-09-05 ENCOUNTER — Telehealth: Payer: Self-pay | Admitting: Pulmonary Disease

## 2022-09-05 DIAGNOSIS — J189 Pneumonia, unspecified organism: Secondary | ICD-10-CM | POA: Diagnosis not present

## 2022-09-05 NOTE — Telephone Encounter (Signed)
Wife on the line. Can hear a rattle in husbands chest at night and he is coughing a lot as well. Has not been seen in a year. No appts avail. Adv got to PCP or present to [employer].

## 2022-09-08 DIAGNOSIS — N5089 Other specified disorders of the male genital organs: Secondary | ICD-10-CM | POA: Diagnosis not present

## 2022-09-08 DIAGNOSIS — N433 Hydrocele, unspecified: Secondary | ICD-10-CM | POA: Diagnosis not present

## 2022-09-09 ENCOUNTER — Encounter (HOSPITAL_BASED_OUTPATIENT_CLINIC_OR_DEPARTMENT_OTHER): Payer: Self-pay | Admitting: Pulmonary Disease

## 2022-09-09 ENCOUNTER — Ambulatory Visit (INDEPENDENT_AMBULATORY_CARE_PROVIDER_SITE_OTHER): Payer: Medicare Other

## 2022-09-09 ENCOUNTER — Ambulatory Visit (INDEPENDENT_AMBULATORY_CARE_PROVIDER_SITE_OTHER): Payer: Medicare Other | Admitting: Pulmonary Disease

## 2022-09-09 VITALS — BP 132/84 | HR 89 | Temp 98.4°F | Ht 74.0 in | Wt 263.6 lb

## 2022-09-09 DIAGNOSIS — R051 Acute cough: Secondary | ICD-10-CM | POA: Diagnosis not present

## 2022-09-09 DIAGNOSIS — R0602 Shortness of breath: Secondary | ICD-10-CM | POA: Diagnosis not present

## 2022-09-09 DIAGNOSIS — R059 Cough, unspecified: Secondary | ICD-10-CM | POA: Diagnosis not present

## 2022-09-09 MED ORDER — AMOXICILLIN-POT CLAVULANATE 875-125 MG PO TABS: 1.0000 | ORAL_TABLET | Freq: Two times a day (BID) | ORAL | 0 refills | Status: DC

## 2022-09-09 MED ORDER — MOXIFLOXACIN HCL 400 MG PO TABS: 400.0000 mg | ORAL_TABLET | Freq: Every day | ORAL | 0 refills | Status: DC

## 2022-09-09 MED ORDER — PREDNISONE 20 MG PO TABS: 40.0000 mg | ORAL_TABLET | Freq: Every day | ORAL | 0 refills | Status: AC

## 2022-09-09 NOTE — Patient Instructions (Addendum)
Community acquired pneumonia -CXR ordered -Avelox x 7 days -Prednisone

## 2022-09-09 NOTE — Progress Notes (Addendum)
[facility]  Chief Complaint  Patient presents with   Follow-up    Patient is here to talk about his cough and wheezing. Patient stated that when he coughs up the phlegm, sometimes its like a cream white and sometimes it has blood in it.     Constitutional:  BP 132/84 (BP Location: Left Arm, Patient Position: Sitting, Cuff Size: Normal)   Pulse 89   Temp 98.4 F (36.9 C) (Oral)   Ht 6\' 2"  (1.88 m)   Wt 263 lb 9.6 oz (119.6 kg)   SpO2 98%   BMI 33.84 kg/m   Past Medical History:  Prostate cancer, Hypothyroidism, Pulmonary infiltrates 2013 s/p VATs lung bx  Past Surgical History:  His  has a past surgical history that includes Mediastinoscopy (07-12-2007); Nasal sinus surgery; prostate biopsy (06/11/12); Video assisted thoracoscopy (vats)/ lymph node sampling (Left, 01-05-2012); Cardiovascular stress test (01-10-2008); transthoracic echocardiogram (12-31-2011); Radioactive seed implant (N/A, 12/23/2012); Colonoscopy (01-15-2009); Lung biopsy; and Polypectomy.  Brief Summary:  Chad [name] is a 72 y.o. male former smoker with obstructive sleep apnea and bronchiectasis.      Subjective:   Dr. Craige Cotta patient who presents for acute visit. Last seen on 05/21/21 for OSA management.  Reports ~1.5 week ago and began developing productive, wheezing and chest congestion. His PCP prescribed azithro however has not helped. Denies fevers. Some chills. He usually has bronchitis once a year similar to this.   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 CPAP 10/06/20 to 11/04/20 >> used on 30 of 30 nights with average 6 hrs 12 min.  Average AHI 0.5 with CPAP 8 cm H2O  Cardiac Tests:  Echo 01/26/18 >> EF 60 to 65%, grade 2 DD, mild AR CPET 06/20/19 >> deconditioning, restriction, chronotropic incompetence  Social History:  He  reports that he quit smoking about 36 years ago. His smoking use included cigarettes and cigars. He has a 0.20 pack-year smoking history. He has never used smokeless tobacco. He reports current alcohol use of about 2.0 standard drinks of alcohol per week. He reports that he does not use drugs.  Family History:  His family history includes Alzheimer's disease in his maternal grandmother and mother; Bradycardia in his father; Cancer in his paternal uncle; Colon cancer in his father; Diabetes in his daughter; Heart attack in his father; Hyperlipidemia in his sister; Hypertension in his mother; Nephrolithiasis in his father; Prostate cancer in his father; Sinusitis in his brother.     Assessment/Plan:  Community acquired pneumonia -CXR ordered -Avelox x 7 days -Prednisone  Obstructive sleep apnea. - he is compliant with CPAP and reports benefit from therapy - uses [employer] for his DME - will have his CPAP change from 8 to 6 cm H2O  CPAP rhinitis. - add astepro 1 spray bid - continue nasal irrigation and flonase  nightly  Tracheobronchomalacia. Bronchiectasis. - f/u with Dr. Marchelle Gearing  Time Spent Involved in Patient Care on Day of Examination:  MDM Moderate  Follow up:   Patient Instructions  Community acquired pneumonia -CXR ordered -Augmentin -Prednisone  Medication List:   Allergies as of 09/09/2022       Reactions   Ivp Dye [iodinated Contrast Media] Anaphylaxis   Coma for a day in '09   Doxycycline Nausea Only   Lactose Intolerance (gi) Other (See Comments)        Medication List        Accurate as of September 09, 2022  3:16 PM. If you have any questions, ask your nurse or doctor.          STOP taking these medications    doxepin 25 MG capsule Commonly known as: SINEQUAN Stopped by: Luciano Cutter, MD       TAKE these medications    albuterol 108 (90 Base) MCG/ACT inhaler Commonly known as: VENTOLIN HFA INHALE 2 PUFFS BY MOUTH FOUR TIMES A DAY FOR LUNG IRRITATION, BREATHING, LUNG SCARRING, MILD ASTHMA.  TAKE 2 PUFFS  WHILE STANDING UP WHEN NEEDED UP TO 4 TIMES A DAY. FOR LUNG IRRITATION, BREATHING, LUNG SCARRING, MILD ASTHMA.  TAKE 2 PUFFS   WHILE STANDING UP WHEN NEEDED UP TO 4 TIMES A DAY.   amoxicillin-clavulanate 875-125 MG tablet Commonly known as: AUGMENTIN Take 1 tablet by mouth 2 (two) times daily. Started by: Luciano Cutter, MD   atorvastatin 10 MG tablet Commonly known as: LIPITOR TAKE 1 TABLET BY MOUTH EVERY DAY   Azelastine HCl 0.15 % Soln Commonly known as: Astepro Place 1 spray into the nose 2 (two) times daily.   Cholecalciferol 50 MCG (2000 UT) Tabs TAKE ONE TABLET BY MOUTH DAILY FOR LOW VITAMIN D   fluticasone 50 MCG/ACT nasal spray Commonly known as: FLONASE INSTILL 2 SPRAYS IN EACH NOSTRIL DAILY FOR ALLERGIC RHINITIS, ALLERGIES, NASAL CONGESTION.   Ibuprofen 200 MG Caps Take 600 mg by mouth as needed.   predniSONE 20 MG tablet Commonly known as: DELTASONE Take 2 tablets (40 mg total) by mouth daily with breakfast for 5  days. Started by: Luciano Cutter, MD        Signature:  Mechele Collin, MD [facility]monary/Critical Care 09/09/2022, 3:16 PM

## 2022-09-09 NOTE — Addendum Note (Signed)
Addended by: Luciano Cutter on: 09/09/2022 03:18 PM   Modules accepted: Orders

## 2022-09-11 DIAGNOSIS — Z8546 Personal history of malignant neoplasm of prostate: Secondary | ICD-10-CM | POA: Diagnosis not present

## 2022-09-11 DIAGNOSIS — N5089 Other specified disorders of the male genital organs: Secondary | ICD-10-CM | POA: Diagnosis not present

## 2022-09-12 NOTE — Telephone Encounter (Signed)
7 day old encounter sent high priority w/no reply from [facility] location.

## 2022-09-12 NOTE — Telephone Encounter (Signed)
Spoke with the pt  He was seen by JE on 09/09/22 at [facility] office  He states that he feels some better, but cough still bothers him  He requested sooner appt with APP  I have scheduled him at [employer] [facility] for next available 09/30/22  Nothing further needed

## 2022-09-30 ENCOUNTER — Encounter: Payer: Self-pay | Admitting: Primary Care

## 2022-09-30 ENCOUNTER — Ambulatory Visit (INDEPENDENT_AMBULATORY_CARE_PROVIDER_SITE_OTHER): Payer: Medicare Other | Admitting: Primary Care

## 2022-09-30 VITALS — BP 114/76 | HR 77 | Temp 97.9°F | Ht 74.0 in | Wt 258.6 lb

## 2022-09-30 DIAGNOSIS — J479 Bronchiectasis, uncomplicated: Secondary | ICD-10-CM | POA: Diagnosis not present

## 2022-09-30 DIAGNOSIS — G4733 Obstructive sleep apnea (adult) (pediatric): Secondary | ICD-10-CM | POA: Diagnosis not present

## 2022-09-30 NOTE — Assessment & Plan Note (Signed)
-   HRCT in September 2023 showed no evidence of ILD, mild bronchiectasis along with air trapping. He was treated for presume CAP in May 2024 with Avelox and prednisone, cough symptoms resolved. FU with Dr. Mariel Kansky in 2-3 months for annual visit.

## 2022-09-30 NOTE — Progress Notes (Addendum)
@Patient  ID: Chad [name], male    DOB: [birth year], 72 y.o.   MRN: [id number]  Chief Complaint  Patient presents with   Follow-up    Doing well    Referring provider: Tally Joe, MD  HPI: 72 year old male.Past medical history significant for OSA and bronchiectasis Patient of Dr. Craige Cotta, last seen on 09/09/2022.  09/30/2022 Patient presents today for follow-up.  He was recently seen in June by Dr. Everardo All for COUGH. CXR on 09/09/22 showed mild chronic scarring left lung base unchanged, no acute findings to explain cough. He was prescribed Avelox and prednisone.  He is doing a lot better. Respiratory wise he is having no issues. Cough and sputum has resolved. He is no longer getting up mucus. No shortness of breath  He is using CPAP nightly. He received new unit in 2023. Sleeping better, he is getting 5-6 hours of sleep a night. No issues with mask fit or pressure settings. Current pressure 6cm h20. [facility]. Resmed Airfit N30i standard size. He does not have SD card. [facility] [place] [phone]   CT chest in September 2024 showed Mild cylindrical bronchiectasis. No evidence of fibrotic interstitial lung disease. Mild air trapping.    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 No evidence of  fibrotic interstitial lung disease. Mild air trapping, indicative of small airways disease  Cardiac test: Echo 01/26/18 >> EF 60 to 65%, grade 2 DD, mild AR CPET 06/20/19 >> deconditioning, restriction, chronotropic incompetence  Allergies  Allergen Reactions   Ivp Dye [Iodinated Contrast Media] Anaphylaxis    Coma for a day in '09   Doxycycline Nausea Only   Lactose Intolerance (Gi) Other (See Comments)    Immunization History  Administered Date(s) Administered   Fluad Quad(high Dose 65+) 01/18/2019, 02/29/2020   Influenza Split 01/02/2011, 03/08/2012   Influenza Whole 01/29/2010   Influenza, High Dose Seasonal PF 12/27/2015, 12/30/2016, 03/26/2018, 02/16/2022   Influenza,inj,Quad PF,6+ Mos 12/28/2012, 01/25/2014, 01/25/2015   Influenza-Unspecified 12/30/2019, 01/29/2021   PFIZER(Purple Top)SARS-COV-2 Vaccination 05/05/2019, 05/26/2019, 02/14/2020   PNEUMOCOCCAL CONJUGATE-20 04/21/2022   Pneumococcal Conjugate-13 05/09/2015   Pneumococcal Polysaccharide-23 03/21/2008, 05/01/2016   Td 11/15/2002   Zoster Recombinant(Shingrix) 04/02/2021, 10/16/2021    Past Medical History:  Diagnosis Date   Anemia    'YEARS AGO"   Aortic stenosis 09/02/2021   Aortic valve calcification 03/04/2021   Arthritis    ASTHMA 02/11/2007        CARBUNCLE AND FURUNCLE OF LEG EXCEPT FOOT 08/14/2009   Qualifier: Diagnosis of  By: Nena Jordan    Carotid stenosis, asymptomatic 01/19/2018   1-39% on carotid Doppler 2015.    CARPAL TUNNEL SYNDROME, LEFT 02/15/2009   Qualifier: Diagnosis of  By: Nena Jordan    Chronic interstitial lung disease (HCC)    PULMONARY INFILTRATE  --  PULMOLOGIST-  DR CLANCE   Chronic steroid use    INTERSTITIAL LUNG DISEASE   COLONIC POLYPS, HX OF 12/23/2006   Qualifier: Diagnosis of  By: Charlsie Quest RMA, Lucy     Complication of anesthesia    EMERGENCE COMBATIVENESS---  PLEASE REFER TO VATS 01-05-1012 PROCEDURE ,  DR Noreene Larsson DOCUMENTED GRADE IV DIFFICULT VISUAL  AIRWAY   DIABETES MELLITUS, TYPE II, BORDERLINE 11/14/2008   Qualifier: Diagnosis of  By: Nena Jordan    Dyspnea on exertion    Elevated PSA 08/13/2011   Fatigue 08/19/2022   GERD 12/23/2006   Qualifier: Diagnosis of  By: Charlsie Quest RMA, Lucy     History of anal fissures    History of hypothyroidism    Hyperlipidemia 05/01/2016   Hypothyroidism 12/23/2006   Qualifier: Diagnosis of  By: Samara Snide     MEDIASTINAL LYMPHADENOPATHY 08/30/2007   Qualifier: Diagnosis of  By: Delford Field MD, Charlcie Cradle    Mild ascending aorta dilatation (HCC) 01/19/2018   Oral thrush 04/22/2012   OSA (obstructive sleep apnea) 12/23/2006   Pt prefers to stay on auto setting.     Palpitations 12/17/2021   Pneumonia 11/13/2011   Prostate cancer (HCC) 06/09/12 bx   Adenocarcinoma   PULMONARY EDEMA 07/05/2007   Qualifier: Diagnosis of  By: Delford Field MD, Charlcie Cradle    Pulmonary infiltrate 12/23/2011   H/o LN, pericardial effusion, pleural effusion unknown origin 2009.  ?resolved with prednisone?? Mediastinoscopy 2009:  Benign lymphoid hyperplasia Ct chest 11/2011: scattered GGO, interstitial changes, small pulmonary nodules, mild LN No response to prolonged course of abx. Autoimmune w/u:  ESR 78, but ACE/RF/DSDNA/CCP/ENA/SCL70/HP panel/ANCA all neg.  ANA low positive (prob neg). VATS bx 12/2011:  CIP with organizing pna.  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  V/Q 01/2012:  Ok +++ response to steroids (started 12/2011) PFT's 12/2012:  No obstruction, TLC 3.25 (42%), DLCO 44% Pulmonary rehab 2014 xrays and symptoms greatly improved with steroids, and no recurrence off steroids    RHINOSINUSITIS, ACUTE 01/29/2010   Qualifier: Diagnosis of  By: Marrion Coy PAIN, LEFT 11/14/2008   Qualifier: Diagnosis of  By: Nena Jordan    Sinusitis 11/26/2010   Sleep apnea    on cpap   Thrombocytopenia (HCC)     Tobacco History: Social History   Tobacco Use  Smoking Status Former   Packs/day: 0.10    Years: 2.00   Additional pack years: 0.00   Total pack years: 0.20   Types: Cigarettes, Cigars   Quit date: 04/17/1986   Years since quitting: 36.4  Smokeless Tobacco Never   Counseling given: Not Answered   Outpatient Medications Prior to Visit  Medication Sig Dispense Refill   albuterol (VENTOLIN HFA) 108 (90 Base) MCG/ACT inhaler INHALE 2 PUFFS BY MOUTH FOUR TIMES A DAY FOR LUNG IRRITATION, BREATHING, LUNG SCARRING, MILD ASTHMA.  TAKE 2 PUFFS  WHILE STANDING UP WHEN NEEDED UP TO 4 TIMES A DAY. FOR LUNG IRRITATION, BREATHING, LUNG SCARRING, MILD ASTHMA.  TAKE 2 PUFFS   WHILE STANDING UP WHEN NEEDED UP TO 4 TIMES A DAY.     atorvastatin (LIPITOR) 10 MG tablet TAKE 1 TABLET BY MOUTH EVERY DAY 90 tablet 2   Cholecalciferol 50 MCG (2000 UT) TABS TAKE ONE TABLET BY MOUTH DAILY FOR LOW VITAMIN D     fluticasone (FLONASE) 50 MCG/ACT nasal spray INSTILL 2 SPRAYS IN EACH NOSTRIL DAILY FOR ALLERGIC RHINITIS, ALLERGIES, NASAL CONGESTION.     Ibuprofen  200 MG CAPS Take 600 mg by mouth as needed.     amoxicillin-clavulanate (AUGMENTIN) 875-125 MG tablet Take 1 tablet by mouth 2 (two) times daily. 14 tablet 0   Azelastine HCl (ASTEPRO) 0.15 % SOLN Place 1 spray into the nose 2 (two) times daily. 30 mL 2   moxifloxacin (AVELOX) 400 MG tablet Take 1 tablet (400 mg total) by mouth daily. 7 tablet 0   No facility-administered medications prior to visit.   Review of Systems  Review of Systems  Constitutional:  Negative for fatigue.  HENT: Negative.    Respiratory: Negative.  Negative for cough and shortness of breath.    Physical Exam  BP 114/76 (BP Location: Left Arm, Patient Position: Sitting, Cuff Size: Large)   Pulse 77   Temp 97.9 F (36.6 C) (Oral)   Ht 6\' 2"  (1.88 m)   Wt 258 lb 9.6 oz (117.3 kg)   SpO2 100%   BMI 33.20 kg/m  Physical Exam Constitutional:      General: He is not in acute distress.    Appearance: Normal appearance. He is not ill-appearing.  HENT:     Head:  Normocephalic and atraumatic.  Cardiovascular:     Rate and Rhythm: Normal rate and regular rhythm.  Pulmonary:     Effort: Pulmonary effort is normal.     Breath sounds: Normal breath sounds.  Musculoskeletal:     Cervical back: Normal range of motion.  Skin:    General: Skin is warm and dry.  Neurological:     General: No focal deficit present.     Mental Status: He is alert and oriented to person, place, and time. Mental status is at baseline.  Psychiatric:        Mood and Affect: Mood normal.        Behavior: Behavior normal.        Thought Content: Thought content normal.        Judgment: Judgment normal.      Lab Results:  CBC    Component Value Date/Time   WBC 5.6 01/16/2022 1030   WBC 6.8 05/28/2021 1100   RBC 4.46 01/16/2022 1030   RBC 4.63 05/28/2021 1100   HGB 13.2 01/16/2022 1030   HCT 39.3 01/16/2022 1030   PLT 159 01/16/2022 1030   MCV 88 01/16/2022 1030   MCH 29.6 01/16/2022 1030   MCH 30.4 12/16/2012 0922   MCHC 33.6 01/16/2022 1030   MCHC 32.8 05/28/2021 1100   RDW 13.8 01/16/2022 1030   LYMPHSABS 1.8 05/28/2021 1100   MONOABS 0.5 05/28/2021 1100   EOSABS 0.2 05/28/2021 1100   BASOSABS 0.1 05/28/2021 1100    BMET    Component Value Date/Time   NA 139 01/16/2022 1030   K 4.5 01/16/2022 1030   CL 102 01/16/2022 1030   CO2 25 01/16/2022 1030   GLUCOSE 91 01/16/2022 1030   GLUCOSE 84 05/28/2021 1100   BUN 11 01/16/2022 1030   CREATININE 0.77 01/16/2022 1030   CALCIUM 9.0 01/16/2022 1030   GFRNONAA 97 09/13/2018 0929   GFRAA 113 09/13/2018 0929    BNP No results found for: "BNP"  ProBNP    Component Value Date/Time   PROBNP 10.0 12/11/2011 1707    Imaging: DG Chest 2 View  Result Date: 09/16/2022 CLINICAL DATA:  Acute cough. Cough and shortness of breath. History of chronic interstitial lung disease. EXAM: CHEST - 2 VIEW COMPARISON:  Most recent radiograph 08/06/2020. High-resolution chest CT 12/10/2021 FINDINGS: Stable lung  volumes. No acute airspace disease. Mild chronic scarring at the left lung base is unchanged. The heart is normal in size with stable mediastinal contours. Normal pulmonary vasculature. No pleural effusion or pneumothorax. No acute osseous abnormalities. IMPRESSION: No acute findings or explanation for cough. Electronically Signed   By: Narda Rutherford M.D.   On: 09/16/2022 11:40     Assessment & Plan:   OSA (obstructive sleep apnea) - Patient reports compliance with CPAP. Sleeping well without significant residual daytime fatigue. Current pressure 6cm h20. Uses Resmed Airfit N30i, standard size. Receives supplies through the [employer] unavailable but we have requested. We have been added to airview. FU in 1 year for sleep follow-up.   Bronchiectasis without complication (HCC) - HRCT in September 2023 showed no evidence of ILD, mild bronchiectasis along with air trapping. He was treated for presume CAP in May 2024 with Avelox and prednisone, cough symptoms resolved. FU with Dr. Mariel Kansky in 2-3 months for annual visit.   Addendum Received compliance report from [place].  Patient is 100% compliant with CPAP from 08/31/22-09/29/22/ Average usage 4 hours 41 minutes.  CPAP pressure 6 cm H2O; residual AHI 1.4. Glenford Bayley, NP 09/30/2022

## 2022-09-30 NOTE — Assessment & Plan Note (Signed)
-   Patient reports compliance with CPAP. Sleeping well without significant residual daytime fatigue. Current pressure 6cm h20. Uses Resmed Airfit N30i, standard size. Receives supplies through the [employer] unavailable but we have requested. We have been added to airview. FU in 1 year for sleep follow-up.

## 2022-09-30 NOTE — Patient Instructions (Addendum)
Glad you are doing better in regards to your cough Continue to wear CPAP nightly 4-6 hours or longer Monitor for worsening respiratory symptoms, colored mucus or fevers   Orders: [facility]  Follow-up: September-October with Dr. Marchelle Gearing (15 min slot- Re: bronchiectasis)   Sleep Apnea Sleep apnea affects breathing during sleep. It causes breathing to stop for 10 seconds or more, or to become shallow. People with sleep apnea usually snore loudly. It can also increase the risk of: Heart attack. Stroke. Being very overweight (obese). Diabetes. Heart failure. Irregular heartbeat. High blood pressure. The goal of treatment is to help you breathe normally again. What are the causes?  The most common cause of this condition is a collapsed or blocked airway. There are three kinds of sleep apnea: Obstructive sleep apnea. This is caused by a blocked or collapsed airway. Central sleep apnea. This happens when the brain does not send the right signals to the muscles that control breathing. Mixed sleep apnea. This is a combination of obstructive and central sleep apnea. What increases the risk? Being overweight. Smoking. Having a small airway. Being older. Being male. Drinking alcohol. Taking medicines to calm yourself (sedatives or tranquilizers). Having family members with the condition. Having a tongue or tonsils that are larger than normal. What are the signs or symptoms? Trouble staying asleep. Loud snoring. Headaches in the morning. Waking up gasping. Dry mouth or sore throat in the morning. Being sleepy or tired during the day. If you are sleepy or tired during the day, you may also: Not be able to focus your mind (concentrate). Forget things. Get angry a lot and have mood swings. Feel sad (depressed). Have changes in your personality. Have less interest in sex, if you are male. Be unable to have an erection, if you are male. How is  this treated?  Sleeping on your side. Using a medicine to get rid of mucus in your nose (decongestant). Avoiding the use of alcohol, medicines to help you relax, or certain pain medicines (narcotics). Losing weight, if needed. Changing your diet. Quitting smoking. Using a machine to open your airway while you sleep, such as: An oral appliance. This is a mouthpiece that shifts your lower jaw forward. A CPAP device. This device blows air through a mask when you breathe out (exhale). An EPAP device. This has valves that you put in each nostril. A BIPAP device. This device blows air through a mask when you breathe in (inhale) and breathe out. Having surgery if other treatments do not work. Follow these instructions at home: Lifestyle Make changes that your doctor recommends. Eat a healthy diet. Lose weight if needed. Avoid alcohol, medicines to help you relax, and some pain medicines. Do not smoke or use any products that contain nicotine or tobacco. If you need help quitting, ask your doctor. General instructions Take over-the-counter and prescription medicines only as told by your doctor. If you were given a machine to use while you sleep, use it only as told by your doctor. If you are having surgery, make sure to tell your doctor you have sleep apnea. You may need to bring your device with you. Keep all follow-up visits. Contact a doctor if: The machine that you were given to use during sleep bothers you or does not seem to be working. You do not get better. You get worse. Get help right away if: Your chest hurts. You have trouble breathing in enough air. You have an uncomfortable feeling in  your back, arms, or stomach. You have trouble talking. One side of your body feels weak. A part of your face is hanging down. These symptoms may be an emergency. Get help right away. Call your local emergency services (911 in the [place] Do not wait to see if the symptoms will go away. Do not  drive yourself to the hospital. Summary This condition affects breathing during sleep. The most common cause is a collapsed or blocked airway. The goal of treatment is to help you breathe normally while you sleep. This information is not intended to replace advice given to you by your health care provider. Make sure you discuss any questions you have with your health care provider. Document Revised: 10/24/2020 Document Reviewed: 02/24/2020 Elsevier Patient Education  2024 [employer].

## 2022-09-30 NOTE — Progress Notes (Signed)
Reviewed and agree with assessment/plan.   Chad Teo, MD  Pulmonary/Critical Care 09/30/2022, 12:10 PM Pager:  [phone]  

## 2022-11-04 ENCOUNTER — Ambulatory Visit (HOSPITAL_BASED_OUTPATIENT_CLINIC_OR_DEPARTMENT_OTHER): Payer: Medicare Other | Admitting: Pulmonary Disease

## 2022-11-04 ENCOUNTER — Encounter (HOSPITAL_BASED_OUTPATIENT_CLINIC_OR_DEPARTMENT_OTHER): Payer: Self-pay | Admitting: Pulmonary Disease

## 2022-11-04 VITALS — BP 118/72 | HR 85 | Resp 15 | Ht 74.0 in | Wt 259.0 lb

## 2022-11-04 DIAGNOSIS — G4733 Obstructive sleep apnea (adult) (pediatric): Secondary | ICD-10-CM | POA: Diagnosis not present

## 2022-11-04 DIAGNOSIS — J329 Chronic sinusitis, unspecified: Secondary | ICD-10-CM

## 2022-11-04 DIAGNOSIS — J479 Bronchiectasis, uncomplicated: Secondary | ICD-10-CM | POA: Diagnosis not present

## 2022-11-04 MED ORDER — AZELASTINE HCL 0.15 % NA SOLN: 1.0000 | Freq: Every day | NASAL | Status: DC

## 2022-11-04 NOTE — Progress Notes (Signed)
[facility]  Chief Complaint  Patient presents with   Follow-up    C-pap is causes sinus infection , waking with a lot of mucus in his nose     Constitutional:  BP 118/72   Pulse 85   Resp 15   Ht 6\' 2"  (1.88 m)   Wt 259 lb (117.5 kg)   SpO2 99%   BMI 33.25 kg/m   Past Medical History:  Prostate cancer, Hypothyroidism, Pulmonary infiltrates 2013 s/p VATs lung bx  Past Surgical History:  His  has a past surgical history that includes Mediastinoscopy (07-12-2007); Nasal sinus surgery; prostate biopsy (06/11/12); Video assisted thoracoscopy (vats)/ lymph node sampling (Left, 01-05-2012); Cardiovascular stress test (01-10-2008); transthoracic echocardiogram (12-31-2011); Radioactive seed implant (N/A, 12/23/2012); Colonoscopy (01-15-2009); Lung biopsy; and Polypectomy.  Brief Summary:  Chad [name] is a 72 y.o. male former smoker with obstructive sleep apnea and bronchiectasis.      Subjective:   He was on ABx and prednisone a couple months ago.  CT chest from September showed stabled BTX and air trapping.  Uses CPAP nightly.  Has nasal pillow mask.  Pressure is okay.  Continues to get sinus congestion and pressure.  Uses flonase, but sometimes gets nose bleeds.  Uses nasal irrigation.  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 CPAP 10/05/22 to 11/03/22 >> used on 30 of 30 nights with average 6 hrs 26 min.  Average AHI 1.2 with CPAP 6 cm H2O  Cardiac Tests:  CPET 06/20/19 >> deconditioning, restriction, chronotropic incompetence Echo 04/09/22 >> EF 60 to 65%, grade 1 DD, mild AS  Social History:  He  reports that he quit smoking about 36 years ago. His smoking use included cigarettes and cigars. He started smoking about 38 years ago. He has a 0.2 pack-year smoking history. He has never used smokeless tobacco. He reports current alcohol use of about 2.0 standard drinks of alcohol per week. He reports that he does not use drugs.  Family History:  His family history includes Alzheimer's disease in his maternal grandmother and mother; Bradycardia in his father; Cancer in his paternal uncle; Colon cancer in his father; Diabetes in his daughter; Heart attack in his father; Hyperlipidemia in his sister; Hypertension in his mother; Nephrolithiasis in his father; Prostate cancer  in his father; Sinusitis in his brother.     Assessment/Plan:   Obstructive sleep apnea. - he is compliant with CPAP and reports benefit from therapy - he uses the [place] for his DME - continue CPAP 6 cm H2O - he will get mask refit at the [place]; asked him to call if he needs an order from [place] for this  Recurrent sinusitis with CPAP rhinitis. - add astepro - continue flonase and nasal irrigation - CT sinus without contrast and then determine if he needs assessment by  ENT  Tracheobronchomalacia. Bronchiectasis. - f/u with Dr. Marchelle Gearing  Time Spent Involved in Patient Care on Day of Examination:  35 minutes  Follow up:   Patient Instructions  Try using astepro 1 spray in each nostril daily in the morning, and nasal irrigation and flonase at night.  [facility] about [facility] a different [facility]AP mask to see if this helps with sinus congestion.  Will call with results of CT sinus.  Medication List:   Allergies as of 11/04/2022       Reactions   Ivp Dye [iodinated Contrast Media] Anaphylaxis   Coma for a day in '09   Doxycycline Nausea Only   Lactose Intolerance (gi) Other (See Comments)        Medication List        Accurate as of November 04, 2022 11:12 AM. If you have any questions, ask your nurse or doctor.          STOP taking these medications    amoxicillin-clavulanate 875-125 MG tablet Commonly known as: AUGMENTIN Stopped by: Coralyn Helling   moxifloxacin 400 MG tablet Commonly known as: AVELOX Stopped by: Coralyn Helling       TAKE these medications    albuterol 108 (90 Base) MCG/ACT inhaler Commonly known as: VENTOLIN HFA INHALE 2 PUFFS BY MOUTH FOUR TIMES A DAY FOR LUNG IRRITATION, BREATHING, LUNG SCARRING, MILD ASTHMA.  TAKE 2 PUFFS  WHILE STANDING UP WHEN NEEDED UP TO 4 TIMES A DAY. FOR LUNG IRRITATION, BREATHING, LUNG SCARRING, MILD ASTHMA.  TAKE 2 PUFFS   WHILE STANDING UP WHEN NEEDED UP TO 4 TIMES A DAY.   atorvastatin 10 MG tablet Commonly known as: LIPITOR TAKE 1 TABLET BY MOUTH EVERY DAY   Azelastine HCl 0.15 % Soln Commonly known as: Astepro Place 1 spray into the nose daily at 2 am. What changed: when to take this Changed by: Coralyn Helling   Cholecalciferol 50 MCG (2000 UT) Tabs TAKE ONE TABLET BY MOUTH DAILY FOR LOW VITAMIN D   fluticasone 50 MCG/ACT nasal spray Commonly known as: FLONASE INSTILL 2 SPRAYS IN EACH NOSTRIL DAILY FOR ALLERGIC RHINITIS, ALLERGIES, NASAL CONGESTION.   Ibuprofen 200 MG  Caps Take 600 mg by mouth as needed.        Signature:  Coralyn Helling, MD [facility] Pulmonary/Critical Care Pager - [phone] 11/04/2022, 11:12 AM

## 2022-11-04 NOTE — Patient Instructions (Signed)
Try using astepro 1 spray in each nostril daily in the morning, and nasal irrigation and flonase at night.  [facility] the [facility] about [facility] a different [facility]AP mask to see if this helps with sinus congestion.  Will call with results of CT sinus.

## 2022-11-06 ENCOUNTER — Ambulatory Visit (HOSPITAL_BASED_OUTPATIENT_CLINIC_OR_DEPARTMENT_OTHER)
Admission: RE | Admit: 2022-11-06 | Discharge: 2022-11-06 | Disposition: A | Discharge status: Home / Self Care | Payer: Medicare Other | Source: Ambulatory Visit | Attending: Pulmonary Disease | Admitting: Pulmonary Disease

## 2022-11-06 DIAGNOSIS — J329 Chronic sinusitis, unspecified: Secondary | ICD-10-CM | POA: Insufficient documentation

## 2022-11-21 ENCOUNTER — Telehealth: Payer: Self-pay | Admitting: Pulmonary Disease

## 2022-11-21 DIAGNOSIS — J324 Chronic pansinusitis: Secondary | ICD-10-CM

## 2022-11-21 NOTE — Telephone Encounter (Signed)
CT sinus 11/06/22 >> Pansinus disease, with some chronic features including bilateral sinus periosteal thickening, and Left OMC obstructive pattern of predominant sinus opacification.  Symmetric nasal cavity mucosal thickening, and opacified left superior meatus raising the possibility of rhinitis.   Results d/w pt.  Will arrange for referral to ENT.  He requests to have ENT set up within [facility] system.

## 2022-12-02 DIAGNOSIS — J300 Vasomotor rhinitis: Secondary | ICD-10-CM | POA: Diagnosis not present

## 2022-12-02 DIAGNOSIS — J4520 Mild intermittent asthma, uncomplicated: Secondary | ICD-10-CM | POA: Diagnosis not present

## 2022-12-02 DIAGNOSIS — J310 Chronic rhinitis: Secondary | ICD-10-CM | POA: Diagnosis not present

## 2022-12-17 ENCOUNTER — Other Ambulatory Visit (HOSPITAL_BASED_OUTPATIENT_CLINIC_OR_DEPARTMENT_OTHER): Payer: Self-pay | Admitting: Cardiovascular Disease

## 2022-12-25 DIAGNOSIS — E782 Mixed hyperlipidemia: Secondary | ICD-10-CM | POA: Diagnosis not present

## 2022-12-25 DIAGNOSIS — Z5181 Encounter for therapeutic drug level monitoring: Secondary | ICD-10-CM | POA: Diagnosis not present

## 2022-12-25 LAB — COMPREHENSIVE METABOLIC PANEL
ALT: 12 IU/L (ref 0–44)
AST: 21 IU/L (ref 0–40)
Albumin: 4.1 g/dL (ref 3.8–4.8)
Alkaline Phosphatase: 65 IU/L (ref 44–121)
BUN/Creatinine Ratio: 11 (ref 10–24)
BUN: 7 mg/dL — ABNORMAL LOW (ref 8–27)
Bilirubin Total: 0.5 mg/dL (ref 0.0–1.2)
CO2: 24 mmol/L (ref 20–29)
Calcium: 9.5 mg/dL (ref 8.6–10.2)
Chloride: 103 mmol/L (ref 96–106)
Creatinine, Ser: 0.64 mg/dL — ABNORMAL LOW (ref 0.76–1.27)
Globulin, Total: 3.3 g/dL (ref 1.5–4.5)
Glucose: 84 mg/dL (ref 70–99)
Potassium: 4.2 mmol/L (ref 3.5–5.2)
Sodium: 141 mmol/L (ref 134–144)
Total Protein: 7.4 g/dL (ref 6.0–8.5)
eGFR: 100 mL/min/{1.73_m2} (ref >59)

## 2022-12-25 LAB — LIPID PANEL
Chol/HDL Ratio: 2.4 ratio (ref 0.0–5.0)
Cholesterol, Total: 138 mg/dL (ref 100–199)
HDL: 57 mg/dL (ref >39)
LDL Chol Calc (NIH): 69 mg/dL (ref 0–99)
Triglycerides: 59 mg/dL (ref 0–149)
VLDL Cholesterol Cal: 12 mg/dL (ref 5–40)

## 2022-12-29 ENCOUNTER — Institutional Professional Consult (permissible substitution) (INDEPENDENT_AMBULATORY_CARE_PROVIDER_SITE_OTHER): Payer: Medicare Other | Admitting: Otolaryngology

## 2023-01-06 ENCOUNTER — Ambulatory Visit (HOSPITAL_BASED_OUTPATIENT_CLINIC_OR_DEPARTMENT_OTHER): Payer: Medicare Other

## 2023-01-06 DIAGNOSIS — I350 Nonrheumatic aortic (valve) stenosis: Secondary | ICD-10-CM | POA: Diagnosis not present

## 2023-01-07 ENCOUNTER — Other Ambulatory Visit (HOSPITAL_BASED_OUTPATIENT_CLINIC_OR_DEPARTMENT_OTHER): Payer: Self-pay | Admitting: *Deleted

## 2023-01-07 DIAGNOSIS — I350 Nonrheumatic aortic (valve) stenosis: Secondary | ICD-10-CM

## 2023-01-07 DIAGNOSIS — I77810 Thoracic aortic ectasia: Secondary | ICD-10-CM

## 2023-01-07 LAB — ECHOCARDIOGRAM COMPLETE
AR max vel: 1.58 cm2
AV Area VTI: 1.72 cm2
AV Area mean vel: 1.57 cm2
AV Mean grad: 14 mm[Hg]
AV Peak grad: 24 mm[Hg]
Ao pk vel: 2.45 m/s
Area-P 1/2: 4.06 cm2
S' Lateral: 1.95 cm

## 2023-01-14 DIAGNOSIS — N433 Hydrocele, unspecified: Secondary | ICD-10-CM | POA: Diagnosis not present

## 2023-01-15 ENCOUNTER — Ambulatory Visit (INDEPENDENT_AMBULATORY_CARE_PROVIDER_SITE_OTHER): Payer: Medicare Other | Admitting: Otolaryngology

## 2023-01-15 ENCOUNTER — Encounter (INDEPENDENT_AMBULATORY_CARE_PROVIDER_SITE_OTHER): Payer: Self-pay

## 2023-01-15 VITALS — Ht 74.0 in | Wt 260.0 lb

## 2023-01-15 DIAGNOSIS — J324 Chronic pansinusitis: Secondary | ICD-10-CM

## 2023-01-15 DIAGNOSIS — J342 Deviated nasal septum: Secondary | ICD-10-CM

## 2023-01-15 DIAGNOSIS — R0981 Nasal congestion: Secondary | ICD-10-CM

## 2023-01-15 DIAGNOSIS — J343 Hypertrophy of nasal turbinates: Secondary | ICD-10-CM

## 2023-01-15 MED ORDER — CETIRIZINE HCL 10 MG PO TABS: 10.0000 mg | ORAL_TABLET | Freq: Every day | ORAL | 11 refills | Status: DC

## 2023-01-15 MED ORDER — AMOXICILLIN-POT CLAVULANATE 875-125 MG PO TABS: 1.0000 | ORAL_TABLET | Freq: Two times a day (BID) | ORAL | 0 refills | Status: AC

## 2023-01-15 MED ORDER — PREDNISONE 10 MG PO TABS: 10.0000 mg | ORAL_TABLET | Freq: Every day | ORAL | 0 refills | Status: AC

## 2023-01-15 NOTE — Progress Notes (Signed)
Dear Dr. Craige Cotta, Here is my assessment for our mutual patient, Chad [name]. Thank you for allowing me the opportunity to care for your patient. Please do not hesitate to contact me should you have any other questions. Sincerely, Dr. Jovita Kussmaul  Otolaryngology Clinic Note  HISTORY: Chad [name] is a 72 y.o. male with history of Aortic Stenosis, Asthma, GERD, OSA kindly referred by Dr. Craige Cotta for evaluation of chronic sinusitis.   He reports that he gets a lot of sinus infections. He reports that he gets sinus infections about every other month. Mostly headaches - between eyes and frontal. He also reports nasal congestion (both sides), sinonasal pain and pressure as well. No drainage anteriorly, but does have post nasal drip. Last  sinusinfection was a few months ago - January or February 2024. He often gets antibiotics for them from urgent care. He currently uses flonase, Lloyd Huger Med sinus irrigation (intermittent) - 3/4 times per month. He tried astepro but it burned so it stopped. Rarely uses zyrtec (only when it gets bad).   Got augmentin and prednisone in June 2024 for PNA; He has had sinus surgery in 1980s, on left side. Current symptoms are congestion, pressure. He also complains of post nasal drip and mucoid drainage especially in morning - brownish. He denies hyposmia. Symptom severity is moderate.  Improvement occurred with flonase, saline irrigation, zyrtec, and antibiotics and steroids.  Additional evaluation has included CT Sinus.  Allergy testing was performed last month - at [facility] - not allergic to anything. No typical AR symptoms  No ASA Sensitivity  Eos 200 (05/2021)  Past Medical History:  Diagnosis Date   Anemia    'YEARS AGO"   Aortic stenosis 09/02/2021   Aortic valve calcification 03/04/2021   Arthritis    ASTHMA 02/11/2007        CARBUNCLE AND FURUNCLE OF LEG EXCEPT FOOT 08/14/2009   Qualifier: Diagnosis of  By: Nena Jordan    Carotid stenosis, asymptomatic  01/19/2018   1-39% on carotid Doppler 2015.    CARPAL TUNNEL SYNDROME, LEFT 02/15/2009   Qualifier: Diagnosis of  By: Nena Jordan    Chronic interstitial lung disease (HCC)    PULMONARY INFILTRATE  --  PULMOLOGIST-  DR CLANCE   Chronic steroid use    INTERSTITIAL LUNG DISEASE   COLONIC POLYPS, HX OF 12/23/2006   Qualifier: Diagnosis of  By: Charlsie Quest RMA, Lucy     Complication of anesthesia    EMERGENCE COMBATIVENESS---  PLEASE REFER TO VATS 01-05-1012 PROCEDURE ,  DR Noreene Larsson DOCUMENTED GRADE IV DIFFICULT VISUAL AIRWAY   DIABETES MELLITUS, TYPE II, BORDERLINE 11/14/2008   Qualifier: Diagnosis of  By: Nena Jordan    Dyspnea on exertion    Elevated PSA 08/13/2011   Fatigue 08/19/2022   GERD 12/23/2006   Qualifier: Diagnosis of  By: Charlsie Quest RMA, Lucy     History of anal fissures    History of hypothyroidism    Hyperlipidemia 05/01/2016   Hypothyroidism 12/23/2006   Qualifier: Diagnosis of  By: Samara Snide     MEDIASTINAL LYMPHADENOPATHY 08/30/2007   Qualifier: Diagnosis of  By: Delford Field MD, Charlcie Cradle    Mild ascending aorta dilatation (HCC) 01/19/2018   Oral thrush 04/22/2012   OSA (obstructive sleep apnea) 12/23/2006   Pt prefers to stay on auto setting.     Palpitations 12/17/2021   Pneumonia 11/13/2011   Prostate cancer (HCC) 06/09/12 bx   Adenocarcinoma   PULMONARY EDEMA 07/05/2007  Qualifier: Diagnosis of  By: Delford Field MD, Charlcie Cradle    Pulmonary infiltrate 12/23/2011   H/o LN, pericardial effusion, pleural effusion unknown origin 2009.  ?resolved with prednisone?? Mediastinoscopy 2009:  Benign lymphoid hyperplasia Ct chest 11/2011: scattered GGO, interstitial changes, small pulmonary nodules, mild LN No response to prolonged course of abx. Autoimmune w/u:  ESR 78, but ACE/RF/DSDNA/CCP/ENA/SCL70/HP panel/ANCA all neg.  ANA low positive (prob neg). VATS bx 12/2011:  CIP with organizing pna.  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  V/Q 01/2012:  Ok +++ response to steroids  (started 12/2011) PFT's 12/2012:  No obstruction, TLC 3.25 (42%), DLCO 44% Pulmonary rehab 2014 xrays and symptoms greatly improved with steroids, and no recurrence off steroids    RHINOSINUSITIS, ACUTE 01/29/2010   Qualifier: Diagnosis of  By: Marrion Coy PAIN, LEFT 11/14/2008   Qualifier: Diagnosis of  By: Nena Jordan    Sinusitis 11/26/2010   Sleep apnea    on cpap   Thrombocytopenia [facility])    Past Surgical History:  Procedure Laterality Date   CARDIOVASCULAR STRESS TEST  01-10-2008   NORMAL EXERCISE STRESS TEST AT GOOD WORKLOAD   COLONOSCOPY  01-15-2009   polyps and tics   LUNG BIOPSY     MEDIASTINOSCOPY  07-12-2007   BILATERAL PLEURAL EFFUSIONS   NASAL SINUS SURGERY     POLYPECTOMY     prostate biopsy  06/11/12   Adenocarcinoma   RADIOACTIVE SEED IMPLANT N/A 12/23/2012   Procedure: RADIOACTIVE SEED IMPLANT;  Surgeon: Marcine Matar, MD;  Location: [facility];  Service: Urology;  Laterality: N/A;   82   seeds implanted    TRANSTHORACIC ECHOCARDIOGRAM  12-31-2011   NORMAL LVF/   EF  55-60%   VIDEO ASSISTED THORACOSCOPY (VATS)/ LYMPH NODE SAMPLING Left 01-05-2012   LUNG AND LYMPH NODE BX'S (CHRONIC INTERSTITIAL PNEUMONIA)   Family History  Problem Relation Age of Onset   Heart attack Father    Prostate cancer Father        seed implant   Nephrolithiasis Father    Colon cancer Father        passed 08-2013   Bradycardia Father        pacemaker   Alzheimer's disease Mother    Hypertension Mother    Hyperlipidemia Sister    Sinusitis Brother    Diabetes Daughter    Cancer Paternal Uncle    Alzheimer's disease Maternal Grandmother    Esophageal cancer Neg Hx    Stomach cancer Neg Hx    Rectal cancer Neg Hx    Social History   Tobacco Use   Smoking status: Former    Current packs/day: 0.00    Average packs/day: 0.1 packs/day for 2.0 years (0.2 ttl pk-yrs)    Types: Cigarettes, Cigars    Start date: 04/17/1984    Quit date:  04/17/1986    Years since quitting: 36.7   Smokeless tobacco: Never  Substance Use Topics   Alcohol use: Yes    Alcohol/week: 2.0 standard drinks of alcohol    Types: 2 Cans of beer per week    Comment: occ   Allergies  Allergen Reactions   Ivp Dye [Iodinated Contrast Media] Anaphylaxis    Coma for a day in '09   Doxycycline Nausea Only   Lactose Intolerance (Gi) Other (See Comments)   Current Outpatient Medications  Medication Sig Dispense Refill   albuterol (VENTOLIN HFA) 108 (90 Base) MCG/ACT inhaler INHALE 2 PUFFS BY  MOUTH FOUR TIMES A DAY FOR LUNG IRRITATION, BREATHING, LUNG SCARRING, MILD ASTHMA.  TAKE 2 PUFFS  WHILE STANDING UP WHEN NEEDED UP TO 4 TIMES A DAY. FOR LUNG IRRITATION, BREATHING, LUNG SCARRING, MILD ASTHMA.  TAKE 2 PUFFS   WHILE STANDING UP WHEN NEEDED UP TO 4 TIMES A DAY.     atorvastatin (LIPITOR) 10 MG tablet Take 1 tablet (10 mg total) by mouth daily. 90 tablet 0   Azelastine HCl (ASTEPRO) 0.15 % SOLN Place 1 spray into the nose daily at 2 am.     Cholecalciferol 50 MCG (2000 UT) TABS TAKE ONE TABLET BY MOUTH DAILY FOR LOW VITAMIN D     fluticasone (FLONASE) 50 MCG/ACT nasal spray INSTILL 2 SPRAYS IN EACH NOSTRIL DAILY FOR ALLERGIC RHINITIS, ALLERGIES, NASAL CONGESTION.     Ibuprofen 200 MG CAPS Take 600 mg by mouth as needed.     No current facility-administered medications for this visit.   Ht 6\' 2"  (1.88 m)   Wt 260 lb (117.9 kg)   BMI 33.38 kg/m   PHYSICAL EXAM:  Ht 6\' 2"  (1.88 m)   Wt 260 lb (117.9 kg)   BMI 33.38 kg/m    Salient findings:  CN II-XII intact Bilateral EAC clear and TM intact with well pneumatized middle ear spaces Nose: Anterior rhinoscopy reveals mild septal deviation left; no polyps noted.  Nasal endoscopy was indicated to better evaluate the nose and paranasal sinuses, given the patient's history and exam findings, and is detailed below. No lesions of oral cavity/oropharynx; dentition fair No obviously palpable neck  masses/lymphadenopathy/thyromegaly No respiratory distress or stridor   PROCEDURE: Diagnostic Nasal Endoscopy Pre-procedure diagnosis: Concern for sinusitis Post-procedure diagnosis: same Indication: See pre-procedure diagnosis and physical exam above Complications: None apparent EBL: 0 mL Anesthesia: Lidocaine 4% and topical decongestant was topically sprayed in each nasal cavity  Description of Procedure:  Patient was identified. A rigid 0 degree endoscope was utilized to evaluate the sinonasal cavities, mucosa, sinus ostia and turbinates and septum.  Overall, signs of mucosal inflammation are noted.  No polyps, or masses noted.   Right Middle meatus: purulence Right SE Recess: clear  Left MM: trace purulence Left SE Recess: clear     Photodocumentation was obtained.  CPT CODE -- 31231 - Mod 25  RADIOGRAPHIC EVALUATION AND INDEPENDENT REVIEW OF OTHER RECORDS::  Sinus CT was almost a post-treatment Aug 2024 - pansinus opacification with post-surgical changes; most pronounced in maxes; left frontal with some osteitic bone (fungal ball unlikely, osteoma - unlikely?); sphenoids with minimal opacification. Cuts 2mm  Prior Pulm notes independently reviewed and summarized  Allergy testing (2024): negative  ASSESSMENT:  72 yo with h/o FESS with chronic sinusitis and Asthma with chronic sinonasal complaints. He's already had a post-treatment CT with significant oapcification, essentially pansinus and still is symptomatic. He also has purulence on endoscopy today Given this and his freq exacerbations, I've recommended b/l FESS but he wants to wait.  PLAN: We've discussed issues and options today.  We reviewed the nasal endoscopy images together.  The risks, benefits and alternatives were discussed and questions answered.  He has elected to proceed with:  1) Augmentin 875 mg PO BID x10d; take with food, take probiotic or yogurt with it; risks/SE discussed 2) Take Prednisone by mouth  (PO) 10mg  x 7 days then stop.; Risks discussed 3) Continue zyrtec 10mg  daily 4) Continue flonase 50 mcg spray 2 puffs each nostril BID 5) Daily neil med sinus irrigations 2) Follow-up in 4  weeks -- sooner as necessary.  Thank you for allowing me the opportunity to care for your patient. Please do not hesitate to contact me should you have any other questions.  Sincerely, Jovita Kussmaul, MD Otolarynoglogist (ENT), [facility] Phone: [phone] Fax: [phone]  01/15/2023, 10:52 AM

## 2023-02-12 ENCOUNTER — Encounter (INDEPENDENT_AMBULATORY_CARE_PROVIDER_SITE_OTHER): Payer: Self-pay

## 2023-02-12 ENCOUNTER — Ambulatory Visit (INDEPENDENT_AMBULATORY_CARE_PROVIDER_SITE_OTHER): Payer: Medicare Other | Admitting: Otolaryngology

## 2023-02-12 ENCOUNTER — Other Ambulatory Visit (INDEPENDENT_AMBULATORY_CARE_PROVIDER_SITE_OTHER): Payer: Self-pay | Admitting: Otolaryngology

## 2023-02-12 VITALS — Ht 74.0 in | Wt 257.0 lb

## 2023-02-12 DIAGNOSIS — J329 Chronic sinusitis, unspecified: Secondary | ICD-10-CM

## 2023-02-12 DIAGNOSIS — J324 Chronic pansinusitis: Secondary | ICD-10-CM

## 2023-02-12 DIAGNOSIS — R0981 Nasal congestion: Secondary | ICD-10-CM

## 2023-02-12 DIAGNOSIS — R0982 Postnasal drip: Secondary | ICD-10-CM

## 2023-02-12 MED ORDER — AMOXICILLIN-POT CLAVULANATE 875-125 MG PO TABS: 1.0000 | ORAL_TABLET | Freq: Two times a day (BID) | ORAL | 0 refills | Status: AC

## 2023-02-12 MED ORDER — PREDNISOLONE ACETATE 1 % OP SUSP: 3.0000 [drp] | Freq: Two times a day (BID) | OPHTHALMIC | 2 refills | Status: DC

## 2023-02-12 NOTE — Progress Notes (Signed)
Dear Dr. Azucena Cecil, Here is my assessment for our mutual patient, Chad [name]. Thank you for allowing me the opportunity to care for your patient. Please do not hesitate to contact me should you have any other questions. Sincerely, Dr. Jovita Kussmaul  Otolaryngology Clinic Note  HISTORY: Chad [name] is a 72 y.o. male with history of Aortic Stenosis, Asthma, GERD, OSA kindly referred by Dr. Azucena Cecil for evaluation of chronic sinusitis.   Initial visit (2024): He reports that he gets a lot of sinus infections. He reports that he gets sinus infections about every other month. Mostly headaches - between eyes and frontal. He also reports nasal congestion (both sides), sinonasal pain and pressure as well. No drainage anteriorly, but does have post nasal drip. Last  sinusinfection was a few months ago - January or February 2024. He often gets antibiotics for them from urgent care. He currently uses flonase, Lloyd Huger Med sinus irrigation (intermittent) - 3/4 times per month. He tried astepro but it burned so it stopped. Rarely uses zyrtec (only when it gets bad).   Got augmentin and prednisone in June 2024 for PNA; He has had sinus surgery in 1980s, on left side. Current symptoms are congestion, pressure. He also complains of post nasal drip and mucoid drainage especially in morning - brownish. He denies hyposmia. Symptom severity is moderate.  Improvement occurred with flonase, saline irrigation, zyrtec, and antibiotics and steroids.  Additional evaluation has included CT Sinus.  Allergy testing was performed last month - at [facility] - not allergic to anything. No typical AR symptoms  No ASA Sensitivity  There was noted purulence on endoscopy, but patient wished to wait so prescribed abx/steroids/rinses/PO antihistamine and flonase and he returns today  Today (02/12/23) He feels like the antibiotics and steroids made a difference. He feels like he has improved. Rinsing everyday, zyrtec gave him constipation so  stopped. Still doing flonase. Some headaches (when he gets stopped up), nasal congesiton has improved. No pain or pressure currently. Feels like something is coming on now though. No post nasal drip. Sense of smell doing ok, but always ok.    Past Medical History:  Diagnosis Date   Anemia    'YEARS AGO"   Aortic stenosis 09/02/2021   Aortic valve calcification 03/04/2021   Arthritis    ASTHMA 02/11/2007        CARBUNCLE AND FURUNCLE OF LEG EXCEPT FOOT 08/14/2009   Qualifier: Diagnosis of  By: Nena Jordan    Carotid stenosis, asymptomatic 01/19/2018   1-39% on carotid Doppler 2015.    CARPAL TUNNEL SYNDROME, LEFT 02/15/2009   Qualifier: Diagnosis of  By: Nena Jordan    Chronic interstitial lung disease (HCC)    PULMONARY INFILTRATE  --  PULMOLOGIST-  DR CLANCE   Chronic steroid use    INTERSTITIAL LUNG DISEASE   COLONIC POLYPS, HX OF 12/23/2006   Qualifier: Diagnosis of  By: Charlsie Quest RMA, Lucy     Complication of anesthesia    EMERGENCE COMBATIVENESS---  PLEASE REFER TO VATS 01-05-1012 PROCEDURE ,  DR Noreene Larsson DOCUMENTED GRADE IV DIFFICULT VISUAL AIRWAY   DIABETES MELLITUS, TYPE II, BORDERLINE 11/14/2008   Qualifier: Diagnosis of  By: Nena Jordan    Dyspnea on exertion    Elevated PSA 08/13/2011   Fatigue 08/19/2022   GERD 12/23/2006   Qualifier: Diagnosis of  By: Tyrone Apple, Lucy     History of anal fissures    History of hypothyroidism    Hyperlipidemia 05/01/2016  Hypothyroidism 12/23/2006   Qualifier: Diagnosis of  By: Samara Snide     MEDIASTINAL LYMPHADENOPATHY 08/30/2007   Qualifier: Diagnosis of  By: Delford Field MD, Charlcie Cradle    Mild ascending aorta dilatation (HCC) 01/19/2018   Oral thrush 04/22/2012   OSA (obstructive sleep apnea) 12/23/2006   Pt prefers to stay on auto setting.     Palpitations 12/17/2021   Pneumonia 11/13/2011   Prostate cancer (HCC) 06/09/12 bx   Adenocarcinoma   PULMONARY EDEMA 07/05/2007   Qualifier: Diagnosis of  By: Delford Field  MD, Charlcie Cradle    Pulmonary infiltrate 12/23/2011   H/o LN, pericardial effusion, pleural effusion unknown origin 2009.  ?resolved with prednisone?? Mediastinoscopy 2009:  Benign lymphoid hyperplasia Ct chest 11/2011: scattered GGO, interstitial changes, small pulmonary nodules, mild LN No response to prolonged course of abx. Autoimmune w/u:  ESR 78, but ACE/RF/DSDNA/CCP/ENA/SCL70/HP panel/ANCA all neg.  ANA low positive (prob neg). VATS bx 12/2011:  CIP with organizing pna.  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  V/Q 01/2012:  Ok +++ response to steroids (started 12/2011) PFT's 12/2012:  No obstruction, TLC 3.25 (42%), DLCO 44% Pulmonary rehab 2014 xrays and symptoms greatly improved with steroids, and no recurrence off steroids    RHINOSINUSITIS, ACUTE 01/29/2010   Qualifier: Diagnosis of  By: Marrion Coy PAIN, LEFT 11/14/2008   Qualifier: Diagnosis of  By: Nena Jordan    Sinusitis 11/26/2010   Sleep apnea    on cpap   Thrombocytopenia [facility])    Past Surgical History:  Procedure Laterality Date   CARDIOVASCULAR STRESS TEST  01-10-2008   NORMAL EXERCISE STRESS TEST AT GOOD WORKLOAD   COLONOSCOPY  01-15-2009   polyps and tics   LUNG BIOPSY     MEDIASTINOSCOPY  07-12-2007   BILATERAL PLEURAL EFFUSIONS   NASAL SINUS SURGERY     POLYPECTOMY     prostate biopsy  06/11/12   Adenocarcinoma   RADIOACTIVE SEED IMPLANT N/A 12/23/2012   Procedure: RADIOACTIVE SEED IMPLANT;  Surgeon: Marcine Matar, MD;  Location: [facility];  Service: Urology;  Laterality: N/A;   82   seeds implanted    TRANSTHORACIC ECHOCARDIOGRAM  12-31-2011   NORMAL LVF/   EF  55-60%   VIDEO ASSISTED THORACOSCOPY (VATS)/ LYMPH NODE SAMPLING Left 01-05-2012   LUNG AND LYMPH NODE BX'S (CHRONIC INTERSTITIAL PNEUMONIA)   Family History  Problem Relation Age of Onset   Heart attack Father    Prostate cancer Father        seed implant   Nephrolithiasis Father    Colon cancer Father         passed 08-2013   Bradycardia Father        pacemaker   Alzheimer's disease Mother    Hypertension Mother    Hyperlipidemia Sister    Sinusitis Brother    Diabetes Daughter    Cancer Paternal Uncle    Alzheimer's disease Maternal Grandmother    Esophageal cancer Neg Hx    Stomach cancer Neg Hx    Rectal cancer Neg Hx    Social History   Tobacco Use   Smoking status: Former    Current packs/day: 0.00    Average packs/day: 0.1 packs/day for 2.0 years (0.2 ttl pk-yrs)    Types: Cigarettes, Cigars    Start date: 04/17/1984    Quit date: 04/17/1986    Years since quitting: 36.8   Smokeless tobacco: Never  Substance Use Topics  Alcohol use: Yes    Alcohol/week: 2.0 standard drinks of alcohol    Types: 2 Cans of beer per week    Comment: occ   Allergies  Allergen Reactions   Ivp Dye [Iodinated Contrast Media] Anaphylaxis    Coma for a day in '09   Doxycycline Nausea Only   Lactose Intolerance (Gi) Other (See Comments)   Current Outpatient Medications  Medication Sig Dispense Refill   albuterol (VENTOLIN HFA) 108 (90 Base) MCG/ACT inhaler INHALE 2 PUFFS BY MOUTH FOUR TIMES A DAY FOR LUNG IRRITATION, BREATHING, LUNG SCARRING, MILD ASTHMA.  TAKE 2 PUFFS  WHILE STANDING UP WHEN NEEDED UP TO 4 TIMES A DAY. FOR LUNG IRRITATION, BREATHING, LUNG SCARRING, MILD ASTHMA.  TAKE 2 PUFFS   WHILE STANDING UP WHEN NEEDED UP TO 4 TIMES A DAY.     atorvastatin (LIPITOR) 10 MG tablet Take 1 tablet (10 mg total) by mouth daily. 90 tablet 0   cetirizine (ZYRTEC) 10 MG tablet Take 1 tablet (10 mg total) by mouth daily. 30 tablet 11   Cholecalciferol 50 MCG (2000 UT) TABS TAKE ONE TABLET BY MOUTH DAILY FOR LOW VITAMIN D     fluticasone (FLONASE) 50 MCG/ACT nasal spray INSTILL 2 SPRAYS IN EACH NOSTRIL DAILY FOR ALLERGIC RHINITIS, ALLERGIES, NASAL CONGESTION.     Ibuprofen 200 MG CAPS Take 600 mg by mouth as needed.     Azelastine HCl (ASTEPRO) 0.15 % SOLN Place 1 spray into the nose daily at 2 am.  (Patient not taking: Reported on 02/12/2023)     No current facility-administered medications for this visit.   Ht 6\' 2"  (1.88 m)   Wt 257 lb (116.6 kg)   BMI 33.00 kg/m   PHYSICAL EXAM:  Ht 6\' 2"  (1.88 m)   Wt 257 lb (116.6 kg)   BMI 33.00 kg/m    Salient findings:  CN II-XII intact Bilateral EAC clear and TM intact with well pneumatized middle ear spaces Nose: Anterior rhinoscopy reveals mild septal deviation left; no polyps noted.  Nasal endoscopy was indicated to better evaluate the nose and paranasal sinuses, given the patient's history and exam findings, and is detailed below. No lesions of oral cavity/oropharynx; dentition fair No obviously palpable neck masses/lymphadenopathy/thyromegaly No respiratory distress or stridor   PROCEDURE: Diagnostic Nasal Endoscopy Pre-procedure diagnosis: Concern for sinusitis Post-procedure diagnosis: same Indication: See pre-procedure diagnosis and physical exam above Complications: None apparent EBL: 0 mL Anesthesia: Lidocaine 4% and topical decongestant was topically sprayed in each nasal cavity  Description of Procedure:  Patient was identified. A rigid 0 degree endoscope was utilized to evaluate the sinonasal cavities, mucosa, sinus ostia and turbinates and septum.  Overall, signs of mucosal inflammation are noted.  No polyps, or masses noted.   Right Middle meatus: clear, improved today without purulence Right SE Recess: clear  Left MM: clear purulence; some mucosal edema left ethmoid area Left SE Recess: some thick mucoid secretions   Prior endo:     CPT CODE -- 31231 - Mod 25  RADIOGRAPHIC EVALUATION AND INDEPENDENT REVIEW OF OTHER RECORDS::  Sinus CT was almost a post-treatment Aug 2024 - pansinus opacification with post-surgical changes; most pronounced in maxes; left frontal with some osteitic bone (fungal ball unlikely, osteoma - unlikely?); sphenoids with minimal opacification. Cuts 2mm  Prior Pulm notes  independently reviewed and summarized  Allergy testing (2024): negative  Eos 200 (05/2021)  ASSESSMENT:  72 yo with h/o FESS with chronic sinusitis s/p FESS in 1980s and Asthma with  chronic sinonasal complaints. He's already had a post-treatment CT with significant oapcification in 2024 and likely osteitic bony change v/s fungal ball left frontal, essentially pansinus opacification otherwise. He has about 5-6 exacerbations per year.   He also has purulence on endoscopy today despite treatment Given this and his freq exacerbations, I've again recommended b/l FESS and we discussed R/B/A and what that would entail but he continues to want to wait.  PLAN: We've discussed issues and options today.  We reviewed the nasal endoscopy images together.  The risks, benefits and alternatives were discussed and questions answered.  He has elected to proceed with:  1) Repeat Augmentin 875 mg PO BID x10d; take with food, take probiotic or yogurt with it; risks/SE discussed 2) Will not do Prednisone this time per patient preference 3) Continue zyrtec 10mg  daily 4) Continue flonase 50 mcg spray 2 puffs each nostril BID 5) Daily neil med sinus irrigations - will add prenisolone drops to this (6 drops each bottle) 6) Follow-up in 3 months -- sooner if necessary; if he wishes to proceed with FESS, will schedule full FESS.   See below regarding exact medications prescribed this encounter including dosages and route: Meds ordered this encounter  Medications   amoxicillin-clavulanate (AUGMENTIN) 875-125 MG tablet    Sig: Take 1 tablet by mouth 2 (two) times daily for 10 days.    Dispense:  20 tablet    Refill:  0   prednisoLONE acetate (PRED FORTE) 1 % ophthalmic suspension    Sig: Place 3 drops into both eyes in the morning and at bedtime. Put 3 drops in each nostril in head hanging position    Dispense:  15 mL    Refill:  2     Thank you for allowing me the opportunity to care for your patient. Please  do not hesitate to contact me should you have any other questions.  Sincerely, Jovita Kussmaul, MD Otolarynoglogist (ENT), [facility] Phone: [phone] Fax: [phone]  02/12/2023, 11:22 AM   MDM:  Level 4: 99214 Complexity/Problems addressed: mod - chronic problem, exacerbation Data complexity: low - Morbidity: mod  - Prescription Drug prescribed or managed: yes

## 2023-02-20 ENCOUNTER — Telehealth (INDEPENDENT_AMBULATORY_CARE_PROVIDER_SITE_OTHER): Payer: Self-pay | Admitting: Otolaryngology

## 2023-02-20 NOTE — Telephone Encounter (Signed)
Patient called to report that he was having a sinus headache yesterday on the left which has now resolved. Feels like his prior infections. No purulent rhinorrhea. No other symptoms. No vision changes, no forehead swelling. He has been taking the Augmentin. I reported that should he continue to have any concerning symptoms as above, he should call our office or go to the ED. I was glad this has resolved, but reported that given his CT findings, he would most likely continue to have exacerbations unless hi structural changes were addressed. He reported understanding and was appreciative of the call  Read Drivers

## 2023-03-02 ENCOUNTER — Ambulatory Visit (HOSPITAL_BASED_OUTPATIENT_CLINIC_OR_DEPARTMENT_OTHER): Payer: Medicare Other | Admitting: Cardiovascular Disease

## 2023-03-02 ENCOUNTER — Encounter (HOSPITAL_BASED_OUTPATIENT_CLINIC_OR_DEPARTMENT_OTHER): Payer: Self-pay | Admitting: Cardiovascular Disease

## 2023-03-02 VITALS — BP 101/60 | HR 94 | Ht 74.0 in | Wt 268.9 lb

## 2023-03-02 DIAGNOSIS — I6523 Occlusion and stenosis of bilateral carotid arteries: Secondary | ICD-10-CM

## 2023-03-02 DIAGNOSIS — I350 Nonrheumatic aortic (valve) stenosis: Secondary | ICD-10-CM

## 2023-03-02 DIAGNOSIS — E785 Hyperlipidemia, unspecified: Secondary | ICD-10-CM

## 2023-03-02 DIAGNOSIS — I77810 Thoracic aortic ectasia: Secondary | ICD-10-CM

## 2023-03-02 DIAGNOSIS — Z5181 Encounter for therapeutic drug level monitoring: Secondary | ICD-10-CM | POA: Diagnosis not present

## 2023-03-02 DIAGNOSIS — G4733 Obstructive sleep apnea (adult) (pediatric): Secondary | ICD-10-CM

## 2023-03-02 MED ORDER — DAPAGLIFLOZIN PROPANEDIOL 10 MG PO TABS: 10.0000 mg | ORAL_TABLET | Freq: Every day | ORAL | Status: DC

## 2023-03-02 NOTE — Progress Notes (Signed)
Cardiology Office Note:  .   Date:  03/02/2023  ID:  Chad [name], DOB 04/24/1950, MRN [id number] PCP: Tally Joe, MD  [facility] Providers Cardiologist:  Chilton Si, MD    History of Present Illness: .    Chad [name] is a 72 y.o. male with HFpEF, asymptomatic coronary calcifications, mild aortic stenosis, carotid stenosis, tracheobronchomalacia, bronchiectasis, OSA on CPAP, hyperlipidemia, and diabetes who presents for follow-up.  He initially saw Dr. Elberta Fortis 10/2015.  He was referred because of a CT scan that showed coronary calcifications and calcified aortic valve.  At the time he was completely asymptomatic.  Fasting lipids revealed an LDL of 126.  He had an echo 12/2011 that revealed LVEF 55 to 60%, and a mildly dilated ascending aorta.  Carotid Dopplers 11/2013 revealed 1-39% stenosis bilaterally.  He had a CPX 02/2016 that revealed mild functional impairment in gas exchange and mild restrictive physiology.  It was felt that his imitations at peak exercise were mostly attributed to body habitus.  He had a repeat echo 12/2017 that showed LVEF 60-65% with grade 2 diastolic dysfunction and no aortic aneurysm.   He complained of intermittent dizzy spells. He had a cardiopulmonary exercise test with Dr. Marchelle Gearing on 05/2019 that revealed moderate functional impairment mostly due to body habitus and deconditioning. There was also some chronotropic incompetence and restriction on spirometry. When he last saw his pulmonologist, his dyspnea was determined to be out of proportion of his coronary disease. Chest CT on 11/2020 showed mild cyclindrical bronchiectasis, calcification in the aorta, two vessel coronary disease, and calcification of the aortic valve.   He was working on lifestyle changes with increased exercise and a plant based diet. He noted left leg cramping. Atorvastatin was reduced with plans to start PCSK9 inhibitor if his lipids remained above goal.  Repeat Echo 02/2021  revealed mild AS (mean gradient 12 mm Hg); descending aorta was not dilated.    At his appointment 11/2021 he reported episodic palpitations waking him up at night.  30 Day monitor revealed rare PACs and PVCs as well as blocked PACs.  He followed up with Gillian Shields, NP 01/2022 and was stable.  He was encouraged to increase his exercise.   Chad [name] reports experiencing increased fatigue, particularly noticeable in the middle of the day, which sometimes necessitates taking naps. He mentions that his sleep quality has improved, now averaging about six and a half hours per night, and he is compliant with using his CPAP machine.  At his appointment 07/2022 he reported increased fatigue and excessive napping.  He noted extreme exhaustion anytime he tried to exercise.   When he tries to exercise he feels exhausted for a couple of days after and requires a day or two of rest before resuming. He denies exertional CP or SOB. He has no LE edema, orthopnea or PND.  He admits to sometimes feeling depressed and has noticed a decrease in his interest in activities he used to enjoy, such as hanging out with friends. His appetite remains good.  He works as a Paediatric nurse and feels very stressed before he will have to go to work.  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  His palpitations have improved.   He was encouraged to follow-up with pulmonology.  He was also encouraged to increase his exercise, as prior CPX demonstrated deconditioning and body habitus were contributing to his exertional symptoms.  Echo 12/2022 revealed LVEF 60-65% with mild LVH and  indeterminate diastolic function.  Mean aortic valve gradient was 14 mmHg.  There was also question of whether depression may be contributing.  Chad [name] presents with intermittent episodes of shortness of breath and fatigue. He reports having both good and bad days. Over the weekend, he experienced a new onset of swelling and itching in his hands and  fingers. The patient denies any recent yard work or other potential exposures, but notes a recent switch in nasal spray from Flonase to [place] and a change in soap to [place], both of which he suspects may have contributed to the reaction. The patient also reports generalized itching all over his body.  In terms of his respiratory symptoms, the patient describes his breathing as "pretty good" but notes he still experiences shortness of breath and fatigue. He has not been engaging in regular exercise, but expresses a preference for walking and has a membership at a [employer].  He has also been on multiple courses of antibiotics, including Prednisone and Amoxicillin. The patient does not report any new medications.  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 He hasn't been able to find much time for exercise lately.  He has no exertional chest pain.     ROS:  As per HPI  Studies Reviewed: Marland Kitchen   EKG Interpretation Date/Time:  Monday March 02 2023 14:14:50 EST Ventricular Rate:  95 PR Interval:  136 QRS Duration:  96 QT Interval:  350 QTC Calculation: 439 R Axis:   -11  Text Interpretation: Normal sinus rhythm RSR' or QR pattern in V1 suggests right ventricular conduction delay When compared with ECG of 01-Jan-2012 15:12, No significant change was found Confirmed by Chilton Si ([id number]) on 03/02/2023 2:23:18 PM   Echo 01/06/2023: IMPRESSIONS    1. Left ventricular ejection fraction, by estimation, is 60 to 65%. The  left ventricle has normal function. The left ventricle has no regional  wall motion abnormalities. There is mild left ventricular hypertrophy.  Left ventricular diastolic parameters  are indeterminate.   2. Right ventricular systolic function is normal. The right ventricular  size is normal. There is normal pulmonary artery systolic pressure.   3. Left atrial size was mild to moderately dilated.   4. The mitral valve is normal in structure. Trivial  mitral valve  regurgitation. No evidence of mitral stenosis.   5. The aortic valve has an indeterminant number of cusps. There is mild  calcification of the aortic valve. There is mild thickening of the aortic  valve. Aortic valve regurgitation is mild. Mild aortic valve stenosis.  Aortic valve area, by VTI measures  1.72 cm. Aortic valve mean gradient measures 14.0 mmHg. Aortic valve Vmax  measures 2.45 m/s.   6. The inferior vena cava is normal in size with greater than 50%  respiratory variability, suggesting right atrial pressure of 3 mmHg.   Risk Assessment/Calculations:            Physical Exam:   VS:  BP 101/60   Pulse 94   Ht 6\' 2"  (1.88 m)   Wt 268 lb 14.4 oz (122 kg)   SpO2 99%   BMI 34.52 kg/m  , BMI Body mass index is 34.52 kg/m. GENERAL:  Well appearing HEENT: Pupils equal round and reactive, fundi not visualized, oral mucosa unremarkable NECK:  No jugular venous distention, waveform within normal limits, carotid upstroke brisk and symmetric, no bruits, no thyromegaly LUNGS:  Clear to auscultation bilaterally HEART:  RRR.  PMI not displaced or  sustained,S1 and S2 within normal limits, no S3, no S4, no clicks, no rubs, no murmurs ABD:  Flat, positive bowel sounds normal in frequency in pitch, no bruits, no rebound, no guarding, no midline pulsatile mass, no hepatomegaly, no splenomegaly EXT:  2 plus pulses throughout, no edema, no cyanosis no clubbing SKIN:  No rashes no nodules NEURO:  Cranial nerves II through XII grossly intact, motor grossly intact throughout PSYCH:  Cognitively intact, oriented to person place and time   ASSESSMENT AND PLAN: .    # Allergic Reaction Recent onset of itching and swelling of hands and fingers. Possible contact dermatitis due to new use of [place] Soap. -Discontinue use of [place] Soap and revert to [place].  # Shortness of Breath and Fatigue # HFpEF:  Possible diastolic dysfunction due to aortic valve stiffness.   Diastolic function was indeterminate on echo.  -Start trial of Farxiga 10mg  daily. -Provide samples for patient to try for a week. -Advise patient to monitor for urinary tract infection as a potential side effect.  # Exercise Lack of regular exercise. -Encourage patient to incorporate walking into daily routine, starting with short durations and gradually increasing.   # Coronary calcification:  # Hyperlipidemia:  Lipids well-controlled. Continue atorvastatin.   Follow-up in six months or sooner if needed.       Signed, Chilton Si, MD

## 2023-03-02 NOTE — Patient Instructions (Signed)
Medication Instructions:  START FARXIGA 10 MG DAILY. IF YOU WANT TO CONTINUE LET us KNOW AFTER 1 WEEK AND WILL SEND PRESCRIPTION TO PHARMACY FOR YOU   *If you need a refill on your cardiac medications before your next appointment, please call your pharmacy*  Lab Work: BMET 1 MONTH   If you have labs (blood work) drawn today and your tests are completely normal, you will receive your results only by: MyChart Message (if you have MyChart) OR A paper copy in the mail If you have any lab test that is abnormal or we need to change your treatment, we will call you to review the results.  Testing/Procedures: NONE  Follow-Up: At [facility], you and your health needs are our priority.  As part of our continuing mission to provide you with exceptional heart care, we have created designated Provider Care Teams.  These Care Teams include your primary Cardiologist (physician) and Advanced Practice Providers (APPs -  Physician Assistants and Nurse Practitioners) who all work together to provide you with the care you need, when you need it.  We recommend signing up for the patient portal called "MyChart".  Sign up information is provided on this After Visit Summary.  MyChart is used to connect with patients for Virtual Visits (Telemedicine).  Patients are able to view lab/test results, encounter notes, upcoming appointments, etc.  Non-urgent messages can be sent to your provider as well.   To learn more about what you can do with MyChart, go to [email or web address].    Your next appointment:   6 month(s)  Provider:   Chilton Si, MD or Gillian Shields, NP

## 2023-03-20 ENCOUNTER — Other Ambulatory Visit (HOSPITAL_BASED_OUTPATIENT_CLINIC_OR_DEPARTMENT_OTHER): Payer: Self-pay | Admitting: Cardiovascular Disease

## 2023-03-30 DIAGNOSIS — N503 Cyst of epididymis: Secondary | ICD-10-CM | POA: Diagnosis not present

## 2023-03-30 DIAGNOSIS — Q5529 Other congenital malformations of testis and scrotum: Secondary | ICD-10-CM | POA: Diagnosis not present

## 2023-03-30 DIAGNOSIS — Z79899 Other long term (current) drug therapy: Secondary | ICD-10-CM | POA: Diagnosis not present

## 2023-03-30 DIAGNOSIS — J45909 Unspecified asthma, uncomplicated: Secondary | ICD-10-CM | POA: Diagnosis not present

## 2023-03-30 DIAGNOSIS — N433 Hydrocele, unspecified: Secondary | ICD-10-CM | POA: Diagnosis not present

## 2023-03-31 DIAGNOSIS — Z01818 Encounter for other preprocedural examination: Secondary | ICD-10-CM | POA: Diagnosis not present

## 2023-04-16 DIAGNOSIS — Z09 Encounter for follow-up examination after completed treatment for conditions other than malignant neoplasm: Secondary | ICD-10-CM | POA: Diagnosis not present

## 2023-04-16 DIAGNOSIS — Z8546 Personal history of malignant neoplasm of prostate: Secondary | ICD-10-CM | POA: Diagnosis not present

## 2023-04-16 DIAGNOSIS — Z923 Personal history of irradiation: Secondary | ICD-10-CM | POA: Diagnosis not present

## 2023-05-05 DIAGNOSIS — Z8546 Personal history of malignant neoplasm of prostate: Secondary | ICD-10-CM | POA: Diagnosis not present

## 2023-05-05 DIAGNOSIS — J302 Other seasonal allergic rhinitis: Secondary | ICD-10-CM | POA: Diagnosis not present

## 2023-05-05 DIAGNOSIS — Z6835 Body mass index (BMI) 35.0-35.9, adult: Secondary | ICD-10-CM | POA: Diagnosis not present

## 2023-05-05 DIAGNOSIS — Z0000 Encounter for general adult medical examination without abnormal findings: Secondary | ICD-10-CM | POA: Diagnosis not present

## 2023-05-05 DIAGNOSIS — F5104 Psychophysiologic insomnia: Secondary | ICD-10-CM | POA: Diagnosis not present

## 2023-05-05 DIAGNOSIS — Z9889 Other specified postprocedural states: Secondary | ICD-10-CM | POA: Diagnosis not present

## 2023-05-05 DIAGNOSIS — I350 Nonrheumatic aortic (valve) stenosis: Secondary | ICD-10-CM | POA: Diagnosis not present

## 2023-05-05 DIAGNOSIS — Z23 Encounter for immunization: Secondary | ICD-10-CM | POA: Diagnosis not present

## 2023-05-05 DIAGNOSIS — E7800 Pure hypercholesterolemia, unspecified: Secondary | ICD-10-CM | POA: Diagnosis not present

## 2023-05-05 DIAGNOSIS — Z1331 Encounter for screening for depression: Secondary | ICD-10-CM | POA: Diagnosis not present

## 2023-05-13 ENCOUNTER — Telehealth (INDEPENDENT_AMBULATORY_CARE_PROVIDER_SITE_OTHER): Payer: Self-pay | Admitting: Otolaryngology

## 2023-05-13 NOTE — Telephone Encounter (Signed)
Reminder Call: Date: 05/14/2023 Status: Sch  Time: 11:30 AM [address] [place], [place] [address]  Confirmed time and location w/patient.

## 2023-05-14 ENCOUNTER — Encounter (INDEPENDENT_AMBULATORY_CARE_PROVIDER_SITE_OTHER): Payer: Self-pay

## 2023-05-14 ENCOUNTER — Ambulatory Visit (INDEPENDENT_AMBULATORY_CARE_PROVIDER_SITE_OTHER): Payer: Medicare Other | Admitting: Otolaryngology

## 2023-05-14 VITALS — BP 123/83 | HR 77

## 2023-05-14 DIAGNOSIS — R0982 Postnasal drip: Secondary | ICD-10-CM

## 2023-05-14 DIAGNOSIS — J329 Chronic sinusitis, unspecified: Secondary | ICD-10-CM

## 2023-05-14 DIAGNOSIS — J45909 Unspecified asthma, uncomplicated: Secondary | ICD-10-CM

## 2023-05-14 DIAGNOSIS — J343 Hypertrophy of nasal turbinates: Secondary | ICD-10-CM

## 2023-05-14 DIAGNOSIS — J324 Chronic pansinusitis: Secondary | ICD-10-CM

## 2023-05-14 DIAGNOSIS — R0981 Nasal congestion: Secondary | ICD-10-CM

## 2023-05-14 MED ORDER — LEVOFLOXACIN 500 MG PO TABS: 500.0000 mg | ORAL_TABLET | Freq: Every day | ORAL | 0 refills | Status: AC

## 2023-05-14 NOTE — Patient Instructions (Signed)
Put 6 steroid drops in the wash and flush half on each side with the wash.

## 2023-05-14 NOTE — Progress Notes (Signed)
 Dear Dr. Azucena Cecil, Here is my assessment for our mutual patient, Chad [name]. Thank you for allowing me the opportunity to care for your patient. Please do not hesitate to contact me should you have any other questions. Sincerely, Dr. Jovita Kussmaul  Otolaryngology Clinic Note  HISTORY: Kamaal Cast is a 73 y.o. male with history of Aortic Stenosis, Asthma, GERD, OSA kindly referred by Dr. Azucena Cecil for evaluation of chronic sinusitis.   Initial visit (2024): He reports that he gets a lot of sinus infections. He reports that he gets sinus infections about every other month. Mostly headaches - between eyes and frontal. He also reports nasal congestion (both sides), sinonasal pain and pressure as well. No drainage anteriorly, but does have post nasal drip. Last  sinusinfection was a few months ago - January or February 2024. He often gets antibiotics for them from urgent care. He currently uses flonase, [employer] sinus irrigation (intermittent) - 3/4 times per month. He tried astepro but it burned so it stopped. Rarely uses zyrtec (only when it gets bad).   Got augmentin and prednisone in June 2024 for PNA; He has had sinus surgery in 1980s, on left side. Current symptoms are congestion, pressure. He also complains of post nasal drip and mucoid drainage especially in morning - brownish. He denies hyposmia. Symptom severity is moderate.  Improvement occurred with flonase, saline irrigation, zyrtec, and antibiotics and steroids.  Additional evaluation has included CT Sinus.  Allergy testing was performed last month - at [facility] - not allergic to anything. No typical AR symptoms  No ASA Sensitivity  There was noted purulence on endoscopy, but patient wished to wait so prescribed abx/steroids/rinses/PO antihistamine and flonase and he returns today  01/2023: He feels like the antibiotics and steroids made a difference. He feels like he has improved. Rinsing everyday, zyrtec gave him constipation so  stopped. Still doing flonase. Some headaches (when he gets stopped up), nasal congestion has improved. No pain or pressure currently. Feels like something is coming on now though. No post nasal drip. Sense of smell doing ok, but always ok.   Prescribed abx for purulence on endo, and again offered FESS but declined; seen in f/u today.  05/2023: He reports he is doing better. No abx/steroids since he saw me last. Not doing steroid in irrigation. Rinses when he feels like he is stopped up. Using Flonase once daily. Intermittent headaches and congestion. No pain or pressure. Sense of smell ok.   Past Medical History:  Diagnosis Date   Anemia    'YEARS AGO"   Aortic stenosis 09/02/2021   Aortic valve calcification 03/04/2021   Arthritis    ASTHMA 02/11/2007        CARBUNCLE AND FURUNCLE OF LEG EXCEPT FOOT 08/14/2009   Qualifier: Diagnosis of  By: Nena Jordan    Carotid stenosis, asymptomatic 01/19/2018   1-39% on carotid Doppler 2015.    CARPAL TUNNEL SYNDROME, LEFT 02/15/2009   Qualifier: Diagnosis of  By: Nena Jordan    Chronic interstitial lung disease (HCC)    PULMONARY INFILTRATE  --  PULMOLOGIST-  DR CLANCE   Chronic steroid use    INTERSTITIAL LUNG DISEASE   COLONIC POLYPS, HX OF 12/23/2006   Qualifier: Diagnosis of  By: Charlsie Quest RMA, Lucy     Complication of anesthesia    EMERGENCE COMBATIVENESS---  PLEASE REFER TO VATS 01-05-1012 PROCEDURE ,  DR Noreene Larsson DOCUMENTED GRADE IV DIFFICULT VISUAL AIRWAY   DIABETES MELLITUS, TYPE II, BORDERLINE  11/14/2008   Qualifier: Diagnosis of  By: Nena Jordan    Dyspnea on exertion    Elevated PSA 08/13/2011   Fatigue 08/19/2022   GERD 12/23/2006   Qualifier: Diagnosis of  By: Charlsie Quest RMA, Lucy     History of anal fissures    History of hypothyroidism    Hyperlipidemia 05/01/2016   Hypothyroidism 12/23/2006   Qualifier: Diagnosis of  By: Samara Snide     MEDIASTINAL LYMPHADENOPATHY 08/30/2007   Qualifier: Diagnosis of  By:  Delford Field MD, Charlcie Cradle    Mild ascending aorta dilatation (HCC) 01/19/2018   Oral thrush 04/22/2012   OSA (obstructive sleep apnea) 12/23/2006   Pt prefers to stay on auto setting.     Palpitations 12/17/2021   Pneumonia 11/13/2011   Prostate cancer (HCC) 06/09/12 bx   Adenocarcinoma   PULMONARY EDEMA 07/05/2007   Qualifier: Diagnosis of  By: Delford Field MD, Charlcie Cradle    Pulmonary infiltrate 12/23/2011   H/o LN, pericardial effusion, pleural effusion unknown origin 2009.  ?resolved with prednisone?? Mediastinoscopy 2009:  Benign lymphoid hyperplasia Ct chest 11/2011: scattered GGO, interstitial changes, small pulmonary nodules, mild LN No response to prolonged course of abx. Autoimmune w/u:  ESR 78, but ACE/RF/DSDNA/CCP/ENA/SCL70/HP panel/ANCA all neg.  ANA low positive (prob neg). VATS bx 12/2011:  CIP with organizing pna.  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  V/Q 01/2012:  Ok +++ response to steroids (started 12/2011) PFT's 12/2012:  No obstruction, TLC 3.25 (42%), DLCO 44% Pulmonary rehab 2014 xrays and symptoms greatly improved with steroids, and no recurrence off steroids    RHINOSINUSITIS, ACUTE 01/29/2010   Qualifier: Diagnosis of  By: Marrion Coy PAIN, LEFT 11/14/2008   Qualifier: Diagnosis of  By: Nena Jordan    Sinusitis 11/26/2010   Sleep apnea    on cpap   Thrombocytopenia [facility])    Past Surgical History:  Procedure Laterality Date   CARDIOVASCULAR STRESS TEST  01-10-2008   NORMAL EXERCISE STRESS TEST AT GOOD WORKLOAD   COLONOSCOPY  01-15-2009   polyps and tics   LUNG BIOPSY     MEDIASTINOSCOPY  07-12-2007   BILATERAL PLEURAL EFFUSIONS   NASAL SINUS SURGERY     POLYPECTOMY     prostate biopsy  06/11/12   Adenocarcinoma   RADIOACTIVE SEED IMPLANT N/A 12/23/2012   Procedure: RADIOACTIVE SEED IMPLANT;  Surgeon: Marcine Matar, MD;  [facility];  Service: Urology;  Laterality: N/A;   81   seeds implanted    TRANSTHORACIC  ECHOCARDIOGRAM  12-31-2011   NORMAL LVF/   EF  55-60%   VIDEO ASSISTED THORACOSCOPY (VATS)/ LYMPH NODE SAMPLING Left 01-05-2012   LUNG AND LYMPH NODE BX'S (CHRONIC INTERSTITIAL PNEUMONIA)   Family History  Problem Relation Age of Onset   Heart attack Father    Prostate cancer Father        seed implant   Nephrolithiasis Father    Colon cancer Father        passed 08-2013   Bradycardia Father        pacemaker   Alzheimer's disease Mother    Hypertension Mother    Hyperlipidemia Sister    Sinusitis Brother    Diabetes Daughter    Cancer Paternal Uncle    Alzheimer's disease Maternal Grandmother    Esophageal cancer Neg Hx    Stomach cancer Neg Hx    Rectal cancer Neg Hx    Social History  Tobacco Use   Smoking status: Former    Current packs/day: 0.00    Average packs/day: 0.1 packs/day for 2.0 years (0.2 ttl pk-yrs)    Types: Cigarettes, Cigars    Start date: 04/17/1984    Quit date: 04/17/1986    Years since quitting: 37.1   Smokeless tobacco: Never  Substance Use Topics   Alcohol use: Yes    Alcohol/week: 2.0 standard drinks of alcohol    Types: 2 Cans of beer per week    Comment: occ   Allergies  Allergen Reactions   Ivp Dye [Iodinated Contrast Media] Anaphylaxis    Coma for a day in '09   Doxycycline Nausea Only   Lactose Intolerance (Gi) Other (See Comments)   Current Outpatient Medications  Medication Sig Dispense Refill   albuterol (VENTOLIN HFA) 108 (90 Base) MCG/ACT inhaler INHALE 2 PUFFS BY MOUTH FOUR TIMES A DAY FOR LUNG IRRITATION, BREATHING, LUNG SCARRING, MILD ASTHMA.  TAKE 2 PUFFS  WHILE STANDING UP WHEN NEEDED UP TO 4 TIMES A DAY. FOR LUNG IRRITATION, BREATHING, LUNG SCARRING, MILD ASTHMA.  TAKE 2 PUFFS   WHILE STANDING UP WHEN NEEDED UP TO 4 TIMES A DAY.     atorvastatin (LIPITOR) 10 MG tablet TAKE 1 TABLET BY MOUTH EVERY DAY 90 tablet 3   cetirizine (ZYRTEC) 10 MG tablet Take 1 tablet (10 mg total) by mouth daily. 30 tablet 11   Cholecalciferol 50  MCG (2000 UT) TABS TAKE ONE TABLET BY MOUTH DAILY FOR LOW VITAMIN D     diclofenac Sodium (VOLTAREN) 1 % GEL Apply 2 g topically 4 (four) times daily. As needed     fluticasone (FLONASE) 50 MCG/ACT nasal spray INSTILL 2 SPRAYS IN EACH NOSTRIL DAILY FOR ALLERGIC RHINITIS, ALLERGIES, NASAL CONGESTION.     lidocaine (LIDODERM) 5 % Place 1 patch onto the skin daily.     mirtazapine (REMERON) 15 MG tablet Take 15 mg by mouth.     riboflavin (VITAMIN B-2) 100 MG TABS tablet Take 100 mg by mouth daily.     prednisoLONE acetate (PRED FORTE) 1 % ophthalmic suspension PLACE 3 DROPS INTO BOTH EYES IN THE MORNING AND AT BEDTIME. PUT 3 DROPS IN EACH NOSTRIL IN HEAD HANGING POSITION (Patient not taking: Reported on 05/14/2023) 15 mL 2   No current facility-administered medications for this visit.   BP 123/83 (BP Location: Left Arm, Patient Position: Sitting, Cuff Size: Normal)   Pulse 77   SpO2 97%   PHYSICAL EXAM:  BP 123/83 (BP Location: Left Arm, Patient Position: Sitting, Cuff Size: Normal)   Pulse 77   SpO2 97%    Salient findings:  CN II-XII intact Bilateral EAC clear and TM intact with well pneumatized middle ear spaces Nose: Anterior rhinoscopy reveals mild septal deviation left; no polyps noted.  Nasal endoscopy was again indicated to better evaluate the nose and paranasal sinuses, given the patient's history and exam findings, and is detailed below. No lesions of oral cavity/oropharynx; dentition fair No obviously palpable neck masses/lymphadenopathy/thyromegaly No respiratory distress or stridor   PROCEDURE: Diagnostic Nasal Endoscopy Pre-procedure diagnosis: Concern for sinusitis Post-procedure diagnosis: same Indication: See pre-procedure diagnosis and physical exam above Complications: None apparent EBL: 0 mL Anesthesia: Lidocaine 4% and topical decongestant was topically sprayed in each nasal cavity  Description of Procedure:  Patient was identified. A rigid 0 degree endoscope was  utilized to evaluate the sinonasal cavities, mucosa, sinus ostia and turbinates and septum.  Overall, signs of mucosal inflammation are again noted.  No polyps, or masses noted.   Right Middle meatus: clear today Right SE Recess: noted purulence Left MM: some purulence, again some mucosal edema left ethmoid area Left SE Recess: continued mucoid secretions  CPT CODE -- 31231 - Mod 25  RADIOGRAPHIC EVALUATION AND INDEPENDENT REVIEW OF OTHER RECORDS::  Sinus CT was almost a post-treatment Aug 2024 - pansinus opacification with post-surgical changes; most pronounced in maxes; left frontal with some osteitic bone (fungal ball unlikely, osteoma - unlikely?); sphenoids with minimal opacification. Cuts 2mm Prior Pulm notes independently reviewed and summarized Allergy testing (2024): negative Eos 200 (05/2021)  ASSESSMENT:  73 y.o. with h/o FESS with chronic sinusitis s/p FESS in 1980s and Asthma with chronic sinonasal complaints.  He's already had a post-treatment CT with significant opacification in 2024 and likely osteitic bony change v/s fungal ball left frontal, essentially pansinus opacification otherwise. He has about 5-6 exacerbations per year.  He also has purulence on endoscopy again today despite treatment  Given this, again recommended b/l FESS and more diligent medical management and we discussed R/B/A and what that would entail but he continues to want to wait.  PLAN: We've discussed issues and options today.  We reviewed the nasal endoscopy images together.  The risks, benefits and alternatives were discussed and questions answered.  He has elected to proceed with:  1) Will do levaquin daily x7d 2) No Prednisone again this time per patient preference 3) Continue zyrtec 10mg  daily 4) Continue flonase 50 mcg spray 2 puffs each nostril BID 5) Encouraged again Daily neil med sinus irrigations - would still recommending adding prenisolone drops to this (6 drops each bottle) 6)  Follow-up in 6 months -- sooner if necessary; if he wishes to proceed with FESS, will schedule full FESS.   See below regarding exact medications prescribed this encounter including dosages and route: Meds ordered this encounter  Medications   levofloxacin (LEVAQUIN) 500 MG tablet    Sig: Take 1 tablet (500 mg total) by mouth daily for 7 days.    Dispense:  7 tablet    Refill:  0     Thank you for allowing me the opportunity to care for your patient. Please do not hesitate to contact me should you have any other questions.  Sincerely, Jovita Kussmaul, MD Otolaryngologist (ENT), [facility] Phone: [phone] Fax: [phone]  05/24/2023, 2:39 PM   MDM:  Level 4: 99214 Complexity/Problems addressed: mod - chronic problem, exacerbation Data complexity: low - Morbidity: mod  - Prescription Drug prescribed or managed: yes

## 2023-05-20 DIAGNOSIS — Z8546 Personal history of malignant neoplasm of prostate: Secondary | ICD-10-CM | POA: Diagnosis not present

## 2023-05-20 DIAGNOSIS — J101 Influenza due to other identified influenza virus with other respiratory manifestations: Secondary | ICD-10-CM | POA: Diagnosis not present

## 2023-05-20 DIAGNOSIS — Z923 Personal history of irradiation: Secondary | ICD-10-CM | POA: Diagnosis not present

## 2023-05-20 DIAGNOSIS — Z48816 Encounter for surgical aftercare following surgery on the genitourinary system: Secondary | ICD-10-CM | POA: Diagnosis not present

## 2023-05-26 DIAGNOSIS — J111 Influenza due to unidentified influenza virus with other respiratory manifestations: Secondary | ICD-10-CM | POA: Diagnosis not present

## 2023-05-28 ENCOUNTER — Ambulatory Visit: Payer: Medicare Other | Admitting: Internal Medicine

## 2023-05-28 ENCOUNTER — Encounter: Payer: Self-pay | Admitting: Internal Medicine

## 2023-05-28 VITALS — BP 107/73 | HR 78 | Temp 98.1°F | Ht 74.0 in | Wt 262.4 lb

## 2023-05-28 DIAGNOSIS — J479 Bronchiectasis, uncomplicated: Secondary | ICD-10-CM | POA: Diagnosis not present
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
## 2023-05-28 NOTE — Patient Instructions (Addendum)
 Bronchiectasis without complication (HCC)  - this is due to sequelae from  lung inflammation  from 2013 - last CT aug 2017 -> repeat CT March 2020 - > just mild without change but you do have new - tracheobronchomalacia in March 2020 scan that can be contributing to shortness of breath - overall stable as of visit 05/28/2023 - last CT Sept 2023 and PFt in 2022 - recentl flu Feb 2024 and recovering well  Plan  - do PFT in 6 months -Continue albuterol as needed   Shortness of breath   -This is likely multifactorial and related to weight, physical conditioning, above bronchiectasis, sleep apnea and also stiff heart muscle called diastolic dysfunction seen on echocardiogram in 2019  - CPST test 2021 indicates primary drver is your lung issues - which is a sequelae frpm your 2013 COP/BOOP illnes - NOt much of an issue 05/28/2023   PLAN  -recommend weight loss - goal 195# (currenty 264#)  -  no further followup ILD clinic or Dr Marchelle Gearing  History of obstructive sleep apnea  -CPAP per Dr Craige Cotta but now Dr Craige Cotta has left practice  Plan  - in fall 2025 will refer to new sleep doc  - conitnue cPAP  Followup - 6 months with DR Marchelle Gearing or APP

## 2023-06-03 IMAGING — CT CT CHEST HIGH RESOLUTION W/O CM
1 of 4 series · 14 of 32 positions shown, 18 images · non-contrast
Comparison: Chest CT 06/08/2018.

CLINICAL DATA: 70-year-old male with history of dyspnea on
exertion. Bronchiectasis. History of prostate cancer.

EXAM:
CT CHEST WITHOUT CONTRAST
TECHNIQUE: Multidetector CT imaging of the chest was performed following the
standard protocol without intravenous contrast. High resolution
imaging of the lungs, as well as inspiratory and expiratory imaging,
was performed.

[Series 2: chest · axial · 0.72mm/px · z∈[-275,-11]mm · 14 of 148 slices shown, 18 images]
[im 8/148  mediastinal]
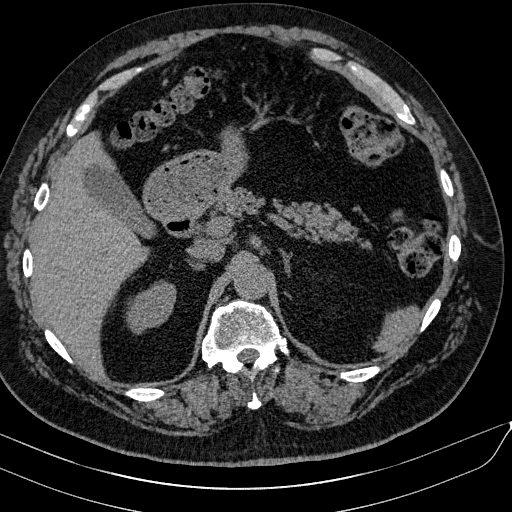
[im 8/148  lung]
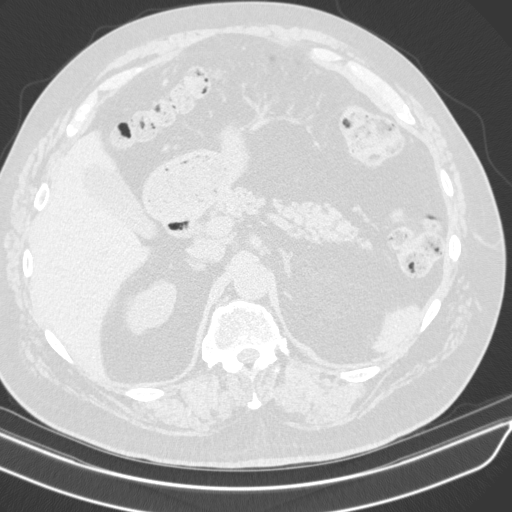
[im 23/148  lung]
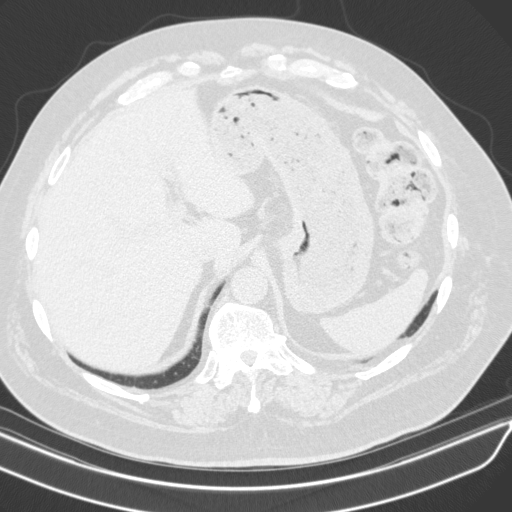
[im 30/148  lung]
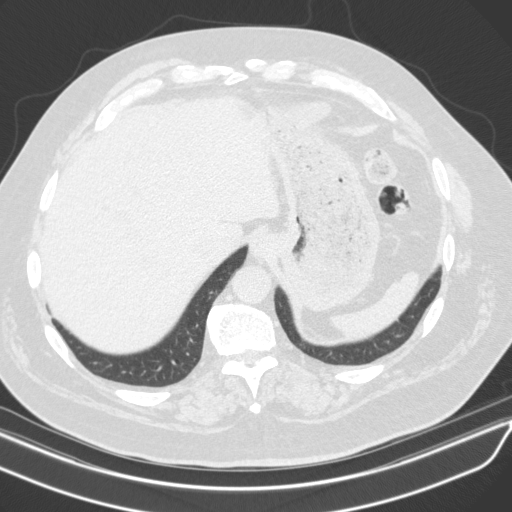
[im 45/148  lung]
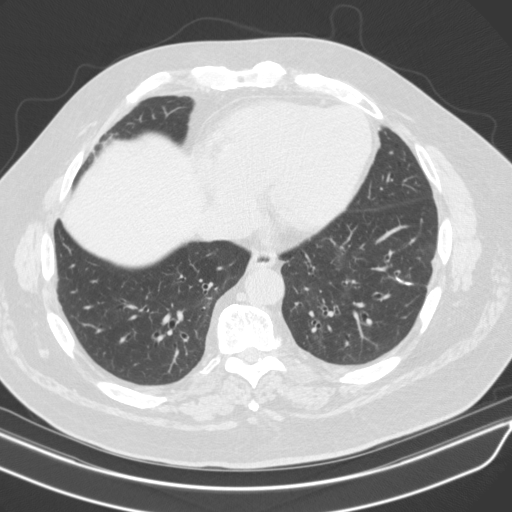
[im 52/148  mediastinal]
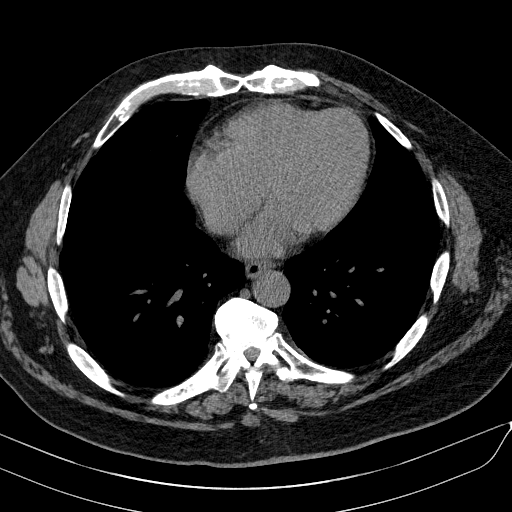
[im 52/148  lung]
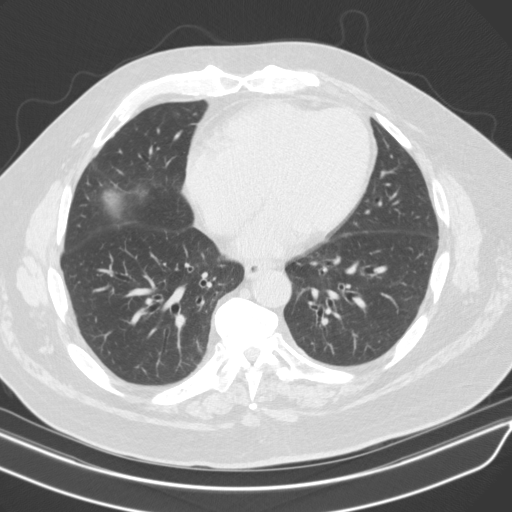
[im 55/148  lung]
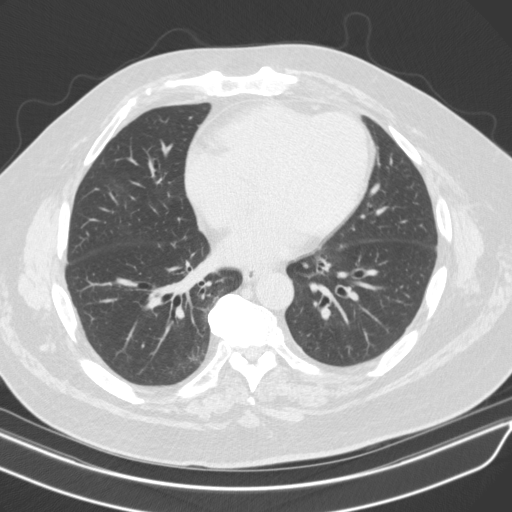
[im 67/148  lung]
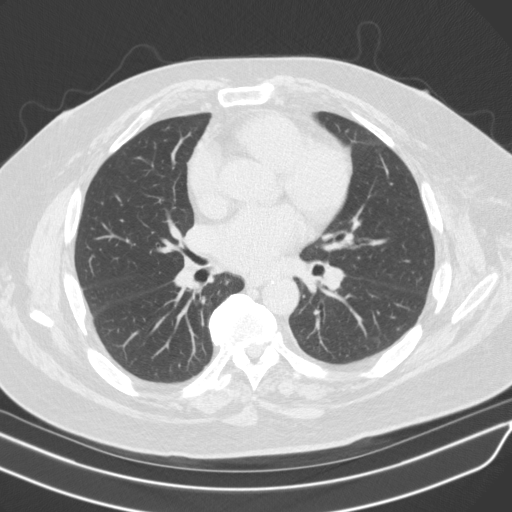
[im 74/148  lung]
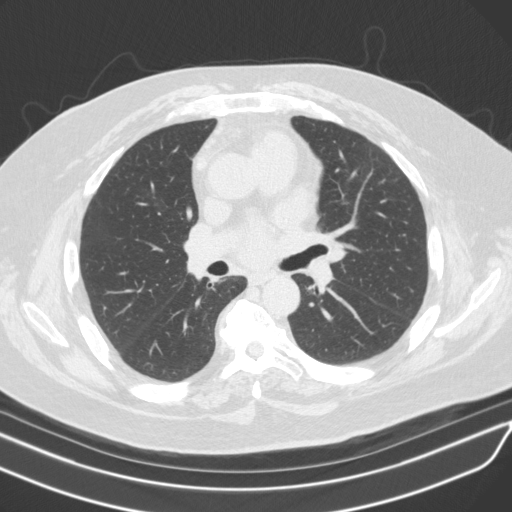
[im 89/148  mediastinal]
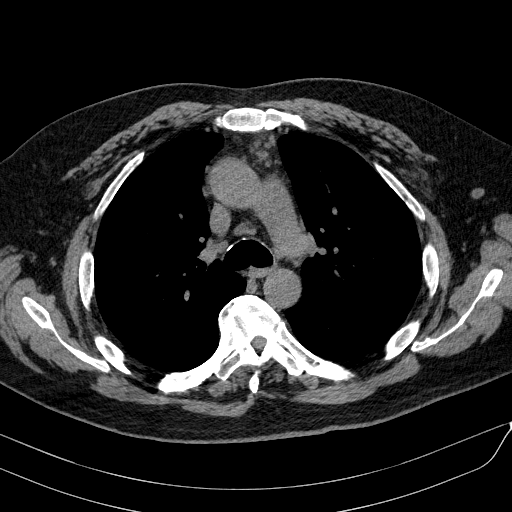
[im 89/148  lung]
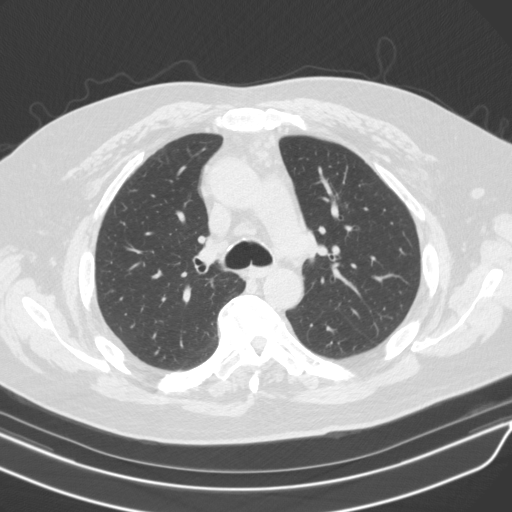
[im 96/148  lung]
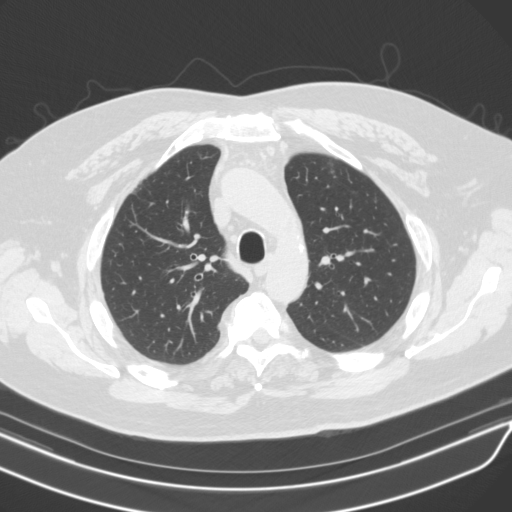
[im 111/148  lung]
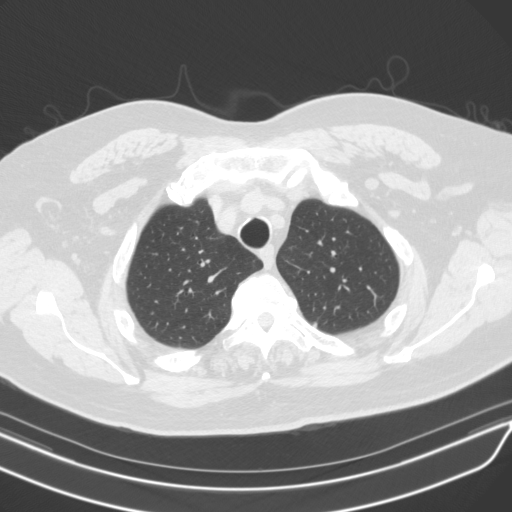
[im 118/148  lung]
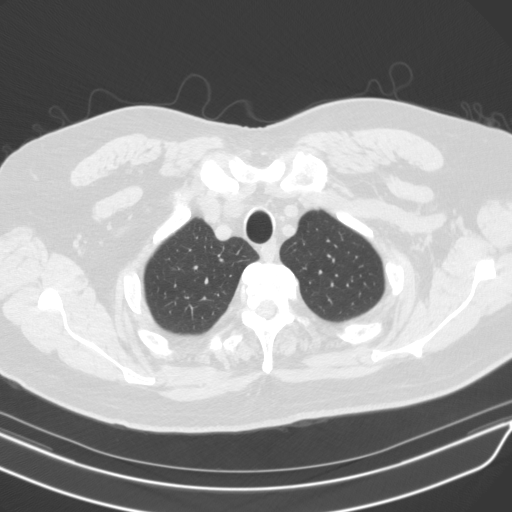
[im 125/148  mediastinal]
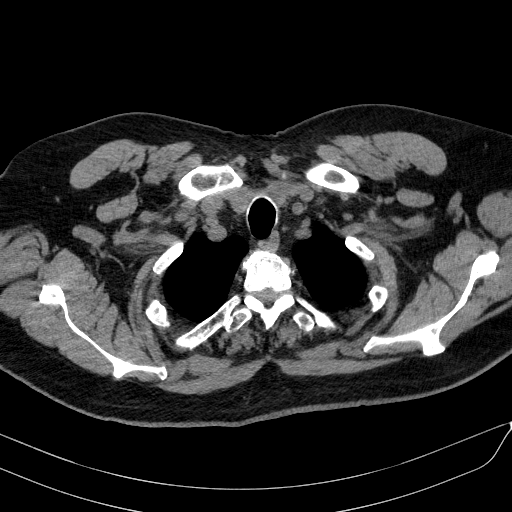
[im 125/148  lung]
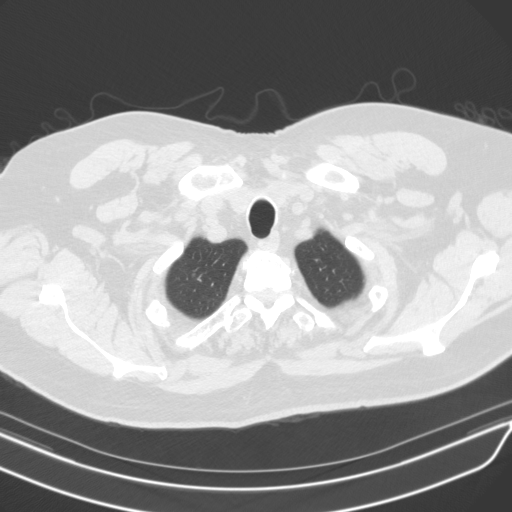
[im 140/148  lung]
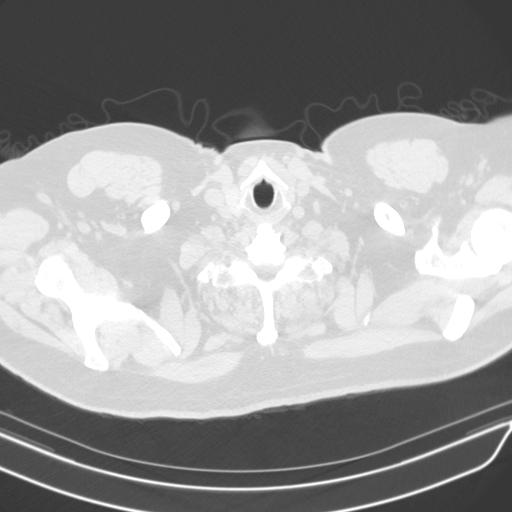

[14 of 32 positions shown; findings below may reference images not displayed]

FINDINGS: Cardiovascular: Heart size is normal. There is no significant
pericardial fluid, thickening or pericardial calcification. There is
aortic atherosclerosis, as well as atherosclerosis of the great
vessels of the mediastinum and the coronary arteries, including
calcified atherosclerotic plaque in the left anterior descending and
right coronary arteries. Calcifications of the aortic valve.

Mediastinum/Nodes: Multiple prominent but nonenlarged anterior
mediastinal lymph nodes, similar to numerous prior examinations
dating back to 5838, nonspecific, but presumably benign. No
pathologically enlarged mediastinal or hilar lymph nodes. Please
note that accurate exclusion of hilar adenopathy is limited on
noncontrast CT scans. Esophagus is unremarkable in appearance. No
axillary lymphadenopathy.

Lungs/Pleura: High-resolution images demonstrates some very mild
ground-glass attenuation and septal thickening most evident in the
lower lobes of the lungs bilaterally. Mild cylindrical
bronchiectasis also noted, also most evident in the lower lobes of
the lungs bilaterally. No subpleural reticulation, and no
honeycombing. Inspiratory and expiratory imaging is unremarkable.
Postoperative changes of open lung biopsy in the periphery of the
left lower lobe. No acute consolidative airspace disease. No pleural
effusions. No suspicious appearing pulmonary nodules or masses are
noted.

Upper Abdomen: Small low-attenuation lesions in the liver,
incompletely characterized on today's non-contrast CT examination
but measuring up to 1.3 cm in segment 2 of the liver, likely to
represent small cysts.

Musculoskeletal: There are no aggressive appearing lytic or blastic
lesions noted in the visualized portions of the skeleton.
IMPRESSION: 1. As with prior studies, the predominant feature on today's
examination is mild cylindrical bronchiectasis. Today's study does
demonstrate some very mild ground-glass attenuation in the lower
lobes of the lungs. This is nonspecific, but the possibility of
developing interstitial lung disease is not entirely excluded.
Repeat high-resolution chest CT is recommended in 12 months to
assess for further temporal changes in the appearance of the lung
parenchyma if clinically appropriate.
2. Aortic atherosclerosis, in addition to 2 vessel coronary artery
disease. Please assessment for potential risk factor modification,
dietary therapy or pharmacologic therapy may be warranted, if
clinically indicated.
3. There are calcifications of the aortic valve. Echocardiographic
correlation for evaluation of potential valvular dysfunction may be
warranted if clinically indicated.

Aortic Atherosclerosis ([email or web address]).

## 2023-08-19 DIAGNOSIS — N5089 Other specified disorders of the male genital organs: Secondary | ICD-10-CM | POA: Diagnosis not present

## 2023-09-09 ENCOUNTER — Encounter (HOSPITAL_BASED_OUTPATIENT_CLINIC_OR_DEPARTMENT_OTHER): Payer: Self-pay | Admitting: Cardiovascular Disease

## 2023-09-10 ENCOUNTER — Encounter (HOSPITAL_BASED_OUTPATIENT_CLINIC_OR_DEPARTMENT_OTHER): Payer: Self-pay | Admitting: Cardiovascular Disease

## 2023-09-10 ENCOUNTER — Other Ambulatory Visit (HOSPITAL_BASED_OUTPATIENT_CLINIC_OR_DEPARTMENT_OTHER): Payer: Self-pay | Admitting: *Deleted

## 2023-09-10 ENCOUNTER — Ambulatory Visit (HOSPITAL_BASED_OUTPATIENT_CLINIC_OR_DEPARTMENT_OTHER): Payer: Medicare Other | Admitting: Cardiovascular Disease

## 2023-09-10 VITALS — BP 112/74 | HR 77 | Ht 74.0 in | Wt 255.6 lb

## 2023-09-10 DIAGNOSIS — I350 Nonrheumatic aortic (valve) stenosis: Secondary | ICD-10-CM

## 2023-09-10 DIAGNOSIS — G4733 Obstructive sleep apnea (adult) (pediatric): Secondary | ICD-10-CM | POA: Diagnosis not present

## 2023-09-10 DIAGNOSIS — I77810 Thoracic aortic ectasia: Secondary | ICD-10-CM | POA: Diagnosis not present

## 2023-09-10 DIAGNOSIS — E785 Hyperlipidemia, unspecified: Secondary | ICD-10-CM | POA: Diagnosis not present

## 2023-09-10 DIAGNOSIS — I6523 Occlusion and stenosis of bilateral carotid arteries: Secondary | ICD-10-CM

## 2023-09-10 NOTE — Progress Notes (Signed)
 Cardiology Office Note:  .   Date:  09/10/2023  ID:  Chad [name], DOB [birth year], MRN [id number] PCP: Rae Bugler, MD  [facility] Providers Cardiologist:  Maudine Sos, MD    History of Present Illness: .   Chad [name] is a 73 y.o. male with HFpEF, asymptomatic coronary calcifications, mild aortic stenosis, carotid stenosis, tracheobronchomalacia, bronchiectasis, OSA on CPAP, hyperlipidemia, and diabetes who presents for follow-up.  He initially saw Dr. Lawana Pray 10/2015.  He was referred because of a CT scan that showed coronary calcifications and calcified aortic valve.  At the time he was completely asymptomatic.  Fasting lipids revealed an LDL of 126.  He had an echo 12/2011 that revealed LVEF 55 to 60%, and a mildly dilated ascending aorta.  Carotid Dopplers 11/2013 revealed 1-39% stenosis bilaterally.  He had a CPX 02/2016 that revealed mild functional impairment in gas exchange and mild restrictive physiology.  It was felt that his imitations at peak exercise were mostly attributed to body habitus.  He had a repeat echo 12/2017 that showed LVEF 60-65% with grade 2 diastolic dysfunction and no aortic aneurysm.   He complained of intermittent dizzy spells. He had a cardiopulmonary exercise test with Dr. Bertrum Brodie on 05/2019 that revealed moderate functional impairment mostly due to body habitus and deconditioning. There was also some chronotropic incompetence and restriction on spirometry. When he last saw his pulmonologist, his dyspnea was determined to be out of proportion of his coronary disease. Chest CT on 11/2020 showed mild cyclindrical bronchiectasis, calcification in the aorta, two vessel coronary disease, and calcification of the aortic valve.   He was working on lifestyle changes with increased exercise and a plant based diet. He noted left leg cramping. Atorvastatin  was reduced with plans to start PCSK9 inhibitor if his lipids remained above goal.  Repeat Echo 02/2021  revealed mild AS (mean gradient 12 mm Hg); descending aorta was not dilated.    At his appointment 11/2021 he reported episodic palpitations waking him up at night.  30 Day monitor revealed rare PACs and PVCs as well as blocked PACs.  He followed up with Neomi Banks, NP 01/2022 and was stable.  He was encouraged to increase his exercise.   Chad [name] reported extreme fatigue with exercise.  He was encouraged to follow-up with pulmonology.  He was also encouraged to increase his exercise, as prior CPX demonstrated deconditioning and body habitus were contributing to his exertional symptoms.  Echo 12/2022 revealed LVEF 60-65% with mild LVH and indeterminate diastolic function.  Mean aortic valve gradient was 14 mmHg.  There was also question of whether depression may be contributing.  At his visit 03/2023 he continued to report exertional fatigue and shortness of breath.  It was thought that HFpEF may be contributing and he was started on Farxiga .    Discussed the use of AI scribe software for clinical note transcription with the patient, who gave verbal consent to proceed.  History of Present Illness  Chad [name] experiences fatigue and shortness of breath during physical activities, which have slightly improved. He attributes some improvement to drinking mushroom coffee, which he believes increases his energy. No chest pain or pressure is reported. He continues to use his CPAP machine regularly, which helps with his sleep apnea. He is concerned about a visible vein on the side of his neck.  His blood pressure is consistently good, and he has made dietary changes to improve his cholesterol levels. Recent lab results show an A1c of 5.7, normal thyroid   function, normal blood count, normal liver function, and good kidney function. His total cholesterol is 145, triglycerides 53, HDL 54, and LDL 79. He is currently taking 10 mg of atorvastatin  daily.  He mentions mild anemia with a hemoglobin level of  12.9, slightly below the normal range. He has cut out red meat from his diet but consumes spinach and eggs regularly for iron. He primarily eats chicken and fish. No blood in stools or dark stools and no swelling in his legs or feet are reported.  He recalls stopping his lisinopril medication temporarily due to a sinus infection, which led to headaches, but he is currently taking it again.  ROS:  As per HPI  Studies Reviewed: .       Echo 12/2022: IMPRESSIONS     1. Left ventricular ejection fraction, by estimation, is 60 to 65%. The  left ventricle has normal function. The left ventricle has no regional  wall motion abnormalities. There is mild left ventricular hypertrophy.  Left ventricular diastolic parameters  are indeterminate.   2. Right ventricular systolic function is normal. The right ventricular  size is normal. There is normal pulmonary artery systolic pressure.   3. Left atrial size was mild to moderately dilated.   4. The mitral valve is normal in structure. Trivial mitral valve  regurgitation. No evidence of mitral stenosis.   5. The aortic valve has an indeterminant number of cusps. There is mild  calcification of the aortic valve. There is mild thickening of the aortic  valve. Aortic valve regurgitation is mild. Mild aortic valve stenosis.  Aortic valve area, by VTI measures  1.72 cm. Aortic valve mean gradient measures 14.0 mmHg. Aortic valve Vmax  measures 2.45 m/s.   6. The inferior vena cava is normal in size with greater than 50%  respiratory variability, suggesting right atrial pressure of 3 mmHg.   Risk Assessment/Calculations:             Physical Exam:   VS:  BP 112/74 (BP Location: Left Arm, Patient Position: Sitting, Cuff Size: Normal)   Pulse 77   Ht 6' 2 (1.88 m)   Wt 255 lb 9.6 oz (115.9 kg)   SpO2 97%   BMI 32.82 kg/m  , BMI Body mass index is 32.82 kg/m. GENERAL:  Well appearing HEENT: Pupils equal round and reactive, fundi not  visualized, oral mucosa unremarkable NECK:  No jugular venous distention, waveform within normal limits, carotid upstroke brisk and symmetric, no bruits, no thyromegaly LUNGS:  Clear to auscultation bilaterally HEART:  RRR.  PMI not displaced or sustained,S1 and S2 within normal limits, no S3, no S4, no clicks, no rubs, II/VI early-peaking systolic murmur at LUSB ABD:  Flat, positive bowel sounds normal in frequency in pitch, no bruits, no rebound, no guarding, no midline pulsatile mass, no hepatomegaly, no splenomegaly EXT:  2 plus pulses throughout, no edema, no cyanosis no clubbing SKIN:  No rashes no nodules NEURO:  Cranial nerves II through XII grossly intact, motor grossly intact throughout PSYCH:  Cognitively intact, oriented to person place and time   ASSESSMENT AND PLAN: .    Assessment & Plan  # Shortness of breath on exertion HFpEF likely contributing.  Improved slightly on mushroom tea. Declined Farxiga  due to concerns about long-term use. Feels better with mushroom coffee and CPAP. - Continue CPAP use. - Encourage increased physical activity as tolerated. - BP well-controlled - Continue to encourage exercise and weight loss.  # OSA: CPAP reduces fatigue. Compliance  is good. - Continue CPAP use.  # Non-obstructive CAD:  # Hyperlipidemia LDL at 79, close to target under 70. Prefers exercise over increasing atorvastatin . Diet and exercise may achieve target. - Increase physical activity to lower LDL levels. - Recheck cholesterol levels in six months. - Continue atorvastatin  at current dose  # Mild anemia Hemoglobin at 12.9, slightly below normal. No alarming symptoms. Diet includes iron-rich foods. - Continue diet with iron-rich foods. - Monitor hemoglobin levels in f  # Mild aortic stenosis:  Due for repeat echo.  He has no heart failure symptoms.  Still sounds mild on exam.         Dispo: f/u in 6 months.  Signed, Maudine Sos, MD

## 2023-09-10 NOTE — Patient Instructions (Signed)
 Medication Instructions:   Your physician recommends that you continue on your current medications as directed. Please refer to the Current Medication list given to you today.   *If you need a refill on your cardiac medications before your next appointment, please call your pharmacy*  Lab Work:  Your physician recommends that you return for a FASTING lipid profile/cmet, fasting after midnight, same day as echo Paperwork given to patient today.    If you have labs (blood work) drawn today and your tests are completely normal, you will receive your results only by: MyChart Message (if you have MyChart) OR A paper copy in the mail If you have any lab test that is abnormal or we need to change your treatment, we will call you to review the results.  Testing/Procedures:  Your physician has requested that you have an echocardiogram. Echocardiography is a painless test that uses sound waves to create images of your heart. It provides your doctor with information about the size and shape of your heart and how well your heart's chambers and valves are working. This procedure takes approximately one hour. There are no restrictions for this procedure. Please do NOT wear cologne, aftershave, or lotions (deodorant is allowed). Please arrive 15 minutes prior to your appointment time.  Please note: We ask at that you not bring children with you during ultrasound (echo/ vascular) testing. Due to room size and safety concerns, children are not allowed in the ultrasound rooms during exams. Our front office staff cannot provide observation of children in our lobby area while testing is being conducted. An adult accompanying a patient to their appointment will only be allowed in the ultrasound room at the discretion of the ultrasound technician under special circumstances. We apologize for any inconvenience.   Follow-Up: At [facility], you and your health needs are our priority.  As part of our  continuing mission to provide you with exceptional heart care, our providers are all part of one team.  This team includes your primary Cardiologist (physician) and Advanced Practice Providers or APPs (Physician Assistants and Nurse Practitioners) who all work together to provide you with the care you need, when you need it.  Your next appointment:   6 month(s)  Provider:   Maudine Sos, MD    We recommend signing up for the patient portal called MyChart.  Sign up information is provided on this After Visit Summary.  MyChart is used to connect with patients for Virtual Visits (Telemedicine).  Patients are able to view lab/test results, encounter notes, upcoming appointments, etc.  Non-urgent messages can be sent to your provider as well.   To learn more about what you can do with MyChart, go to [email or web address].   Other Instructions  Your physician wants you to follow-up in: 6 months after echo.  You will receive a reminder letter in the mail two months in advance. If you don't receive a letter, please call our office to schedule the follow-up appointment.  Please increase your exercise.

## 2023-10-06 ENCOUNTER — Telehealth: Payer: Self-pay | Admitting: Internal Medicine

## 2023-10-06 NOTE — Telephone Encounter (Signed)
 PT arrived at office to drop off [facility] paperwork for [facility]. His RN Powell came to the front desk and picked up the paperwork.

## 2023-10-27 DIAGNOSIS — Z8546 Personal history of malignant neoplasm of prostate: Secondary | ICD-10-CM | POA: Diagnosis not present

## 2023-10-27 DIAGNOSIS — N538 Other male sexual dysfunction: Secondary | ICD-10-CM | POA: Diagnosis not present

## 2023-10-27 DIAGNOSIS — R8289 Other abnormal findings on cytological and histological examination of urine: Secondary | ICD-10-CM | POA: Diagnosis not present

## 2023-10-27 DIAGNOSIS — C61 Malignant neoplasm of prostate: Secondary | ICD-10-CM | POA: Diagnosis not present

## 2023-11-12 DIAGNOSIS — C61 Malignant neoplasm of prostate: Secondary | ICD-10-CM | POA: Diagnosis not present

## 2023-11-17 ENCOUNTER — Encounter (INDEPENDENT_AMBULATORY_CARE_PROVIDER_SITE_OTHER): Payer: Self-pay | Admitting: Otolaryngology

## 2023-11-17 ENCOUNTER — Ambulatory Visit (INDEPENDENT_AMBULATORY_CARE_PROVIDER_SITE_OTHER): Payer: Medicare Other | Admitting: Otolaryngology

## 2023-11-17 VITALS — BP 112/71 | HR 77 | Ht 74.0 in | Wt 257.0 lb

## 2023-11-17 DIAGNOSIS — R0982 Postnasal drip: Secondary | ICD-10-CM

## 2023-11-17 DIAGNOSIS — J324 Chronic pansinusitis: Secondary | ICD-10-CM

## 2023-11-17 DIAGNOSIS — J329 Chronic sinusitis, unspecified: Secondary | ICD-10-CM | POA: Diagnosis not present

## 2023-11-17 DIAGNOSIS — R0981 Nasal congestion: Secondary | ICD-10-CM

## 2023-11-17 DIAGNOSIS — J343 Hypertrophy of nasal turbinates: Secondary | ICD-10-CM

## 2023-11-17 DIAGNOSIS — Z9889 Other specified postprocedural states: Secondary | ICD-10-CM

## 2023-11-17 NOTE — Progress Notes (Signed)
 Dear Dr. Seabron, Here is my assessment for our mutual patient, Chad [name]. Thank you for allowing me the opportunity to care for your patient. Please do not hesitate to contact me should you have any other questions. Sincerely, Dr. Eldora Blanch  Otolaryngology Clinic Note  HISTORY: Chad [name] is a 73 y.o. male with history of Aortic Stenosis, Asthma, GERD, OSA kindly referred by Dr. Seabron for evaluation of chronic sinusitis.   Initial visit (2024): He reports that he gets a lot of sinus infections. He reports that he gets sinus infections about every other month. Mostly headaches - between eyes and frontal. He also reports nasal congestion (both sides), sinonasal pain and pressure as well. No drainage anteriorly, but does have post nasal drip. Last  sinusinfection was a few months ago - January or February 2024. He often gets antibiotics for them from urgent care. He currently uses flonase , [facility] sinus irrigation (intermittent) - 3/4 times per month. He tried astepro  but it burned so it stopped. Rarely uses zyrtec  (only when it gets bad).   Got augmentin  and prednisone  in June 2024 for PNA; He has had sinus surgery in 1980s, on left side. Current symptoms are congestion, pressure. He also complains of post nasal drip and mucoid drainage especially in morning - brownish. He denies hyposmia. Symptom severity is moderate.  Improvement occurred with flonase , saline irrigation, zyrtec , and antibiotics and steroids.  Additional evaluation has included CT Sinus.  Allergy testing was performed last month - at [facility] - not allergic to anything. No typical AR symptoms  No ASA Sensitivity  There was noted purulence on endoscopy, but patient wished to wait so prescribed abx/steroids/rinses/PO antihistamine and flonase  and he returns today  01/2023: He feels like the antibiotics and steroids made a difference. He feels like he has improved. Rinsing everyday, zyrtec  gave him constipation so  stopped. Still doing flonase . Some headaches (when he gets stopped up), nasal congestion has improved. No pain or pressure currently. Feels like something is coming on now though. No post nasal drip. Sense of smell doing ok, but always ok.   Prescribed abx for purulence on endo, and again offered FESS but declined; seen in f/u today.  05/2023: He reports he is doing better. No abx/steroids since he saw me last. Not doing steroid in irrigation. Rinses when he feels like he is stopped up. Using Flonase  once daily. Intermittent headaches and congestion. No pain or pressure. Sense of smell ok.   --------------------------------------------------------- 11/17/2023 Reports no significant issues except for some intermittent headaches. Did get cipro  for prostate infection since he saw me last. Just doing saltwater rinses, doing every day. Using flonase  as needed. No pain or pressure.    Past Medical History:  Diagnosis Date   Anemia    'YEARS AGO   Aortic stenosis 09/02/2021   Aortic valve calcification 03/04/2021   Arthritis    ASTHMA 02/11/2007        CARBUNCLE AND FURUNCLE OF LEG EXCEPT FOOT 08/14/2009   Qualifier: Diagnosis of  By: Georgian ROSALEA CHARM Lamar    Carotid stenosis, asymptomatic 01/19/2018   1-39% on carotid Doppler 2015.    CARPAL TUNNEL SYNDROME, LEFT 02/15/2009   Qualifier: Diagnosis of  By: Georgian ROSALEA CHARM Lamar    Chronic interstitial lung disease (HCC)    PULMONARY INFILTRATE  --  PULMOLOGIST-  DR CLANCE   Chronic steroid use    INTERSTITIAL LUNG DISEASE   COLONIC POLYPS, HX OF 12/23/2006   Qualifier: Diagnosis of  By: Wilhemina RMA, Lucy     Complication of anesthesia    EMERGENCE COMBATIVENESS---  PLEASE REFER TO VATS 01-05-1012 PROCEDURE ,  DR JOSLIN DOCUMENTED GRADE IV DIFFICULT VISUAL AIRWAY   DIABETES MELLITUS, TYPE II, BORDERLINE 11/14/2008   Qualifier: Diagnosis of  By: Georgian ROSALEA CHARM Lamar    Dyspnea on exertion    Elevated PSA 08/13/2011   Fatigue 08/19/2022   GERD  12/23/2006   Qualifier: Diagnosis of  By: Wilhemina RMA, Lucy     History of anal fissures    History of hypothyroidism    Hyperlipidemia 05/01/2016   Hypothyroidism 12/23/2006   Qualifier: Diagnosis of  By: Wilhemina SHARALYN Aspen     MEDIASTINAL LYMPHADENOPATHY 08/30/2007   Qualifier: Diagnosis of  By: Brien MD, Belvie BRAVO    Mild ascending aorta dilatation (HCC) 01/19/2018   Oral thrush 04/22/2012   OSA (obstructive sleep apnea) 12/23/2006   Pt prefers to stay on auto setting.     Palpitations 12/17/2021   Pneumonia 11/13/2011   Prostate cancer (HCC) 06/09/12 bx   Adenocarcinoma   PULMONARY EDEMA 07/05/2007   Qualifier: Diagnosis of  By: Brien MD, Belvie BRAVO    Pulmonary infiltrate 12/23/2011   H/o LN, pericardial effusion, pleural effusion unknown origin 2009.  ?resolved with prednisone ?? Mediastinoscopy 2009:  Benign lymphoid hyperplasia Ct chest 11/2011: scattered GGO, interstitial changes, small pulmonary nodules, mild LN No response to prolonged course of abx. Autoimmune w/u:  ESR 78, but ACE/RF/DSDNA/CCP/ENA/SCL70/HP panel/ANCA all neg.  ANA low positive (prob neg). VATS bx 12/2011:  CIP with organizing pna.  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  V/Q 01/2012:  Ok +++ response to steroids (started 12/2011) PFT's 12/2012:  No obstruction, TLC 3.25 (42%), DLCO 44% Pulmonary rehab 2014 xrays and symptoms greatly improved with steroids, and no recurrence off steroids    RHINOSINUSITIS, ACUTE 01/29/2010   Qualifier: Diagnosis of  By: Georgian ROSALEA CHARM Lamar CATHY PAIN, LEFT 11/14/2008   Qualifier: Diagnosis of  By: Georgian ROSALEA CHARM Lamar    Sinusitis 11/26/2010   Sleep apnea    on cpap   Thrombocytopenia [facility])    Past Surgical History:  Procedure Laterality Date   CARDIOVASCULAR STRESS TEST  01-10-2008   NORMAL EXERCISE STRESS TEST AT GOOD WORKLOAD   COLONOSCOPY  01-15-2009   polyps and tics   LUNG BIOPSY     MEDIASTINOSCOPY  07-12-2007   BILATERAL PLEURAL EFFUSIONS   NASAL SINUS SURGERY      POLYPECTOMY     prostate biopsy  06/11/12   Adenocarcinoma   RADIOACTIVE SEED IMPLANT N/A 12/23/2012   Procedure: RADIOACTIVE SEED IMPLANT;  Surgeon: Garnette Shack, MD;  Location: [facility];  Service: Urology;  Laterality: N/A;   81   seeds implanted    TRANSTHORACIC ECHOCARDIOGRAM  12-31-2011   NORMAL LVF/   EF  55-60%   VIDEO ASSISTED THORACOSCOPY (VATS)/ LYMPH NODE SAMPLING Left 01-05-2012   LUNG AND LYMPH NODE BX'S (CHRONIC INTERSTITIAL PNEUMONIA)   Family History  Problem Relation Age of Onset   Heart attack Father    Prostate cancer Father        seed implant   Nephrolithiasis Father    Colon cancer Father        passed 08-2013   Bradycardia Father        pacemaker   Alzheimer's disease Mother    Hypertension Mother    Hyperlipidemia Sister    Sinusitis Brother    Diabetes Daughter  Cancer Paternal Uncle    Alzheimer's disease Maternal Grandmother    Esophageal cancer Neg Hx    Stomach cancer Neg Hx    Rectal cancer Neg Hx    Social History   Tobacco Use   Smoking status: Former    Current packs/day: 0.00    Average packs/day: 0.1 packs/day for 2.0 years (0.2 ttl pk-yrs)    Types: Cigarettes, Cigars    Start date: 04/17/1984    Quit date: 04/17/1986    Years since quitting: 37.6   Smokeless tobacco: Never  Substance Use Topics   Alcohol use: Yes    Alcohol/week: 2.0 standard drinks of alcohol    Types: 2 Cans of beer per week    Comment: occ   Allergies  Allergen Reactions   Amoxicillin  Anaphylaxis and Other (See Comments)   Ivp Dye [Iodinated Contrast Media] Anaphylaxis    Coma for a day in '09   Doxycycline  Nausea Only   Lactose Intolerance (Gi) Other (See Comments)   Current Outpatient Medications  Medication Sig Dispense Refill   acetaminophen  (TYLENOL ) 325 MG tablet Take 650 mg by mouth.     albuterol  (VENTOLIN  HFA) 108 (90 Base) MCG/ACT inhaler INHALE 2 PUFFS BY MOUTH FOUR TIMES A DAY FOR LUNG IRRITATION, BREATHING, LUNG  SCARRING, MILD ASTHMA.  TAKE 2 PUFFS  WHILE STANDING UP WHEN NEEDED UP TO 4 TIMES A DAY. FOR LUNG IRRITATION, BREATHING, LUNG SCARRING, MILD ASTHMA.  TAKE 2 PUFFS   WHILE STANDING UP WHEN NEEDED UP TO 4 TIMES A DAY.     atorvastatin  (LIPITOR) 10 MG tablet TAKE 1 TABLET BY MOUTH EVERY DAY 90 tablet 3   Cholecalciferol 50 MCG (2000 UT) TABS TAKE ONE TABLET BY MOUTH DAILY FOR LOW VITAMIN D     diclofenac Sodium (VOLTAREN) 1 % GEL Apply 2 g topically 4 (four) times daily. As needed     fluticasone  (FLONASE ) 50 MCG/ACT nasal spray INSTILL 2 SPRAYS IN EACH NOSTRIL DAILY FOR ALLERGIC RHINITIS, ALLERGIES, NASAL CONGESTION.     lidocaine  (LIDODERM ) 5 % Place 1 patch onto the skin daily.     mirtazapine (REMERON) 15 MG tablet Take 15 mg by mouth.     riboflavin (VITAMIN B-2) 100 MG TABS tablet Take 100 mg by mouth daily.     rizatriptan (MAXALT) 10 MG tablet Take 10 mg by mouth.     Azelastine  HCl 137 MCG/SPRAY SOLN 1 spray ea nostril Nasally once a day PM for 30 days (Patient not taking: Reported on 11/17/2023)     No current facility-administered medications for this visit.   BP 112/71 (BP Location: Left Arm, Patient Position: Sitting, Cuff Size: Large)   Pulse 77   Ht 6' 2 (1.88 m)   Wt 257 lb (116.6 kg)   SpO2 97%   BMI 33.00 kg/m   PHYSICAL EXAM:  BP 112/71 (BP Location: Left Arm, Patient Position: Sitting, Cuff Size: Large)   Pulse 77   Ht 6' 2 (1.88 m)   Wt 257 lb (116.6 kg)   SpO2 97%   BMI 33.00 kg/m    Salient findings:  CN II-XII intact Bilateral EAC clear and TM intact with well pneumatized middle ear spaces Nose: Anterior rhinoscopy reveals mild septal deviation left; no polyps noted.  Nasal endoscopy was again indicated to better evaluate the nose and paranasal sinuses, given the patient's history and exam findings, and is detailed below. No lesions of oral cavity/oropharynx; dentition fair No obviously palpable neck masses/lymphadenopathy/thyromegaly No respiratory distress  or stridor   PROCEDURE: Diagnostic Nasal Endoscopy Pre-procedure diagnosis: Concern for sinusitis Post-procedure diagnosis: same Indication: See pre-procedure diagnosis and physical exam above Complications: None apparent EBL: 0 mL Anesthesia: Lidocaine  4% and topical decongestant was topically sprayed in each nasal cavity  Description of Procedure:  Patient was identified. A rigid 0 degree endoscope was utilized to evaluate the sinonasal cavities, mucosa, sinus ostia and turbinates and septum.  Overall, signs of mucosal inflammation are not noted today.  No polyps, or masses noted.   Right Middle meatus: clear today Right SE Recess: clear today Left MM: no purulence, max is quite small with some scarring; still noted some mucosal edema left ethmoid area; middle turbinate somewhat lateralized Left SE Recess: clear  CPT CODE -- 31231 - Mod 25  RADIOGRAPHIC EVALUATION AND INDEPENDENT REVIEW OF OTHER RECORDS::  Sinus CT was almost a post-treatment Aug 2024 - pansinus opacification with post-surgical changes; most pronounced in maxes; left frontal with some osteitic bone (fungal ball unlikely, osteoma - unlikely?); sphenoids with minimal opacification. Cuts 2mm Prior Pulm notes independently reviewed and summarized Allergy testing (2024): negative Eos 200 (05/2021)  ASSESSMENT:  73 y.o. with h/o FESS with chronic sinusitis s/p FESS in 1980s and Asthma with chronic sinonasal complaints.  He's already had a post-treatment CT with significant opacification in 2024 and likely osteitic bony change v/s fungal ball left frontal, essentially pansinus opacification otherwise. He has about 5-6 exacerbations per year.   Given this, again recommended b/l FESS and more diligent medical management and we discussed R/B/A and what that would entail but he continues to want to wait. Thankfully, he has not had exacerbations this spring.  PLAN: We've discussed issues and options today.  We reviewed the nasal  endoscopy images together.  The risks, benefits and alternatives were discussed and questions answered.  He has elected to proceed with:   - Continue zyrtec  10mg  daily - Re-start flonase  50 mcg spray 2 puffs each nostril BID - Continue daily neil med sinus irrigations - doing these regularly seems to be helpinghim 6) Follow-up next summer months -- sooner if necessary; if he wishes to proceed with FESS, will schedule full FESS.   See below regarding exact medications prescribed this encounter including dosages and route: No orders of the defined types were placed in this encounter.    Thank you for allowing me the opportunity to care for your patient. Please do not hesitate to contact me should you have any other questions.  Sincerely, Eldora Blanch, MD Otolaryngologist (ENT), [facility] Phone: [phone] Fax: [phone]  11/17/2023, 11:06 AM   MDM:  Level 4: 99214 Complexity/Problems addressed: mod - chronic problems Data complexity: low - Morbidity: mod  - Prescription Drug prescribed or managed: y

## 2023-12-16 ENCOUNTER — Encounter (HOSPITAL_BASED_OUTPATIENT_CLINIC_OR_DEPARTMENT_OTHER): Payer: Self-pay | Admitting: *Deleted

## 2023-12-29 DIAGNOSIS — C61 Malignant neoplasm of prostate: Secondary | ICD-10-CM | POA: Diagnosis not present

## 2024-01-25 DIAGNOSIS — C61 Malignant neoplasm of prostate: Secondary | ICD-10-CM | POA: Diagnosis not present

## 2024-02-16 DIAGNOSIS — C61 Malignant neoplasm of prostate: Secondary | ICD-10-CM | POA: Diagnosis not present

## 2024-02-16 DIAGNOSIS — Z9889 Other specified postprocedural states: Secondary | ICD-10-CM | POA: Diagnosis not present

## 2024-02-16 DIAGNOSIS — I899 Noninfective disorder of lymphatic vessels and lymph nodes, unspecified: Secondary | ICD-10-CM | POA: Diagnosis not present

## 2024-02-18 DIAGNOSIS — C61 Malignant neoplasm of prostate: Secondary | ICD-10-CM | POA: Diagnosis not present

## 2024-03-01 ENCOUNTER — Ambulatory Visit (INDEPENDENT_AMBULATORY_CARE_PROVIDER_SITE_OTHER)

## 2024-03-01 DIAGNOSIS — I77810 Thoracic aortic ectasia: Secondary | ICD-10-CM | POA: Diagnosis not present

## 2024-03-01 DIAGNOSIS — I6523 Occlusion and stenosis of bilateral carotid arteries: Secondary | ICD-10-CM | POA: Diagnosis not present

## 2024-03-01 DIAGNOSIS — I350 Nonrheumatic aortic (valve) stenosis: Secondary | ICD-10-CM | POA: Diagnosis not present

## 2024-03-01 DIAGNOSIS — E785 Hyperlipidemia, unspecified: Secondary | ICD-10-CM | POA: Diagnosis not present

## 2024-03-01 DIAGNOSIS — G4733 Obstructive sleep apnea (adult) (pediatric): Secondary | ICD-10-CM | POA: Diagnosis not present

## 2024-03-01 LAB — ECHOCARDIOGRAM COMPLETE
AR max vel: 1.51 cm2
AV Area VTI: 1.39 cm2
AV Area mean vel: 1.42 cm2
AV Mean grad: 19 mmHg
AV Peak grad: 34.3 mmHg
AV Vena cont: 0.25 cm
Ao pk vel: 2.93 m/s
Area-P 1/2: 4.21 cm2
P 1/2 time: 416 ms
S' Lateral: 2.78 cm

## 2024-03-10 DIAGNOSIS — R0981 Nasal congestion: Secondary | ICD-10-CM | POA: Diagnosis not present

## 2024-03-10 DIAGNOSIS — R067 Sneezing: Secondary | ICD-10-CM | POA: Diagnosis not present

## 2024-03-25 ENCOUNTER — Other Ambulatory Visit (HOSPITAL_BASED_OUTPATIENT_CLINIC_OR_DEPARTMENT_OTHER): Payer: Self-pay | Admitting: Cardiovascular Disease

## 2024-04-02 ENCOUNTER — Ambulatory Visit: Payer: Self-pay | Admitting: Cardiovascular Disease
